# Patient Record
Sex: Male | Born: 1940
Health system: Southern US, Community
[De-identification: ages and names within clinical notes are randomized; demographics above are authoritative.]

## PROBLEM LIST (undated history)

## (undated) DIAGNOSIS — I2510 Atherosclerotic heart disease of native coronary artery without angina pectoris: Secondary | ICD-10-CM

## (undated) DIAGNOSIS — E039 Hypothyroidism, unspecified: Secondary | ICD-10-CM

## (undated) DIAGNOSIS — Z87442 Personal history of urinary calculi: Secondary | ICD-10-CM

## (undated) DIAGNOSIS — K5730 Diverticulosis of large intestine without perforation or abscess without bleeding: Secondary | ICD-10-CM

## (undated) DIAGNOSIS — E785 Hyperlipidemia, unspecified: Secondary | ICD-10-CM

## (undated) DIAGNOSIS — I6529 Occlusion and stenosis of unspecified carotid artery: Secondary | ICD-10-CM

## (undated) DIAGNOSIS — N200 Calculus of kidney: Secondary | ICD-10-CM

## (undated) DIAGNOSIS — K449 Diaphragmatic hernia without obstruction or gangrene: Secondary | ICD-10-CM

## (undated) DIAGNOSIS — N201 Calculus of ureter: Secondary | ICD-10-CM

## (undated) DIAGNOSIS — I739 Peripheral vascular disease, unspecified: Secondary | ICD-10-CM

## (undated) DIAGNOSIS — R001 Bradycardia, unspecified: Secondary | ICD-10-CM

## (undated) DIAGNOSIS — Z951 Presence of aortocoronary bypass graft: Secondary | ICD-10-CM

## (undated) DIAGNOSIS — I4510 Unspecified right bundle-branch block: Secondary | ICD-10-CM

## (undated) DIAGNOSIS — N401 Enlarged prostate with lower urinary tract symptoms: Secondary | ICD-10-CM

## (undated) DIAGNOSIS — R011 Cardiac murmur, unspecified: Secondary | ICD-10-CM

## (undated) DIAGNOSIS — R413 Other amnesia: Secondary | ICD-10-CM

## (undated) DIAGNOSIS — I10 Essential (primary) hypertension: Secondary | ICD-10-CM

## (undated) DIAGNOSIS — N1330 Unspecified hydronephrosis: Secondary | ICD-10-CM

## (undated) DIAGNOSIS — E119 Type 2 diabetes mellitus without complications: Secondary | ICD-10-CM

## (undated) DIAGNOSIS — R600 Localized edema: Secondary | ICD-10-CM

## (undated) HISTORY — DX: Essential (primary) hypertension: I10

## (undated) HISTORY — DX: Occlusion and stenosis of unspecified carotid artery: I65.29

## (undated) HISTORY — PX: TRANSTHORACIC ECHOCARDIOGRAM: SHX275

## (undated) HISTORY — DX: Other amnesia: R41.3

## (undated) HISTORY — DX: Peripheral vascular disease, unspecified: I73.9

## (undated) HISTORY — DX: Atherosclerotic heart disease of native coronary artery without angina pectoris: I25.10

## (undated) HISTORY — DX: Hypothyroidism, unspecified: E03.9

## (undated) HISTORY — DX: Hyperlipidemia, unspecified: E78.5

---

## 1996-10-26 HISTORY — PX: CORONARY ARTERY BYPASS GRAFT: SHX141

## 2009-10-26 HISTORY — PX: CATARACT EXTRACTION W/ INTRAOCULAR LENS  IMPLANT, BILATERAL: SHX1307

## 2013-06-14 ENCOUNTER — Other Ambulatory Visit: Payer: Self-pay | Admitting: Interventional Cardiology

## 2013-06-16 ENCOUNTER — Encounter (HOSPITAL_COMMUNITY)
Admission: RE | Disposition: A | Discharge status: Home / Self Care | Payer: Self-pay | Source: Ambulatory Visit | Attending: Interventional Cardiology

## 2013-06-16 ENCOUNTER — Ambulatory Visit (HOSPITAL_COMMUNITY)
Admission: RE | Admit: 2013-06-16 | Discharge: 2013-06-16 | Disposition: A | Discharge status: Home / Self Care | Payer: Medicare Other | Source: Ambulatory Visit | Attending: Interventional Cardiology | Admitting: Interventional Cardiology

## 2013-06-16 DIAGNOSIS — E663 Overweight: Secondary | ICD-10-CM | POA: Insufficient documentation

## 2013-06-16 DIAGNOSIS — E119 Type 2 diabetes mellitus without complications: Secondary | ICD-10-CM | POA: Insufficient documentation

## 2013-06-16 DIAGNOSIS — E039 Hypothyroidism, unspecified: Secondary | ICD-10-CM | POA: Insufficient documentation

## 2013-06-16 DIAGNOSIS — I2510 Atherosclerotic heart disease of native coronary artery without angina pectoris: Secondary | ICD-10-CM | POA: Insufficient documentation

## 2013-06-16 DIAGNOSIS — I2582 Chronic total occlusion of coronary artery: Secondary | ICD-10-CM | POA: Insufficient documentation

## 2013-06-16 DIAGNOSIS — E785 Hyperlipidemia, unspecified: Secondary | ICD-10-CM | POA: Insufficient documentation

## 2013-06-16 DIAGNOSIS — Z79899 Other long term (current) drug therapy: Secondary | ICD-10-CM | POA: Insufficient documentation

## 2013-06-16 DIAGNOSIS — R9439 Abnormal result of other cardiovascular function study: Secondary | ICD-10-CM | POA: Diagnosis present

## 2013-06-16 DIAGNOSIS — I25810 Atherosclerosis of coronary artery bypass graft(s) without angina pectoris: Secondary | ICD-10-CM | POA: Insufficient documentation

## 2013-06-16 DIAGNOSIS — I2089 Other forms of angina pectoris: Secondary | ICD-10-CM | POA: Diagnosis present

## 2013-06-16 DIAGNOSIS — I10 Essential (primary) hypertension: Secondary | ICD-10-CM | POA: Insufficient documentation

## 2013-06-16 DIAGNOSIS — Z6833 Body mass index (BMI) 33.0-33.9, adult: Secondary | ICD-10-CM | POA: Insufficient documentation

## 2013-06-16 DIAGNOSIS — I208 Other forms of angina pectoris: Secondary | ICD-10-CM | POA: Diagnosis present

## 2013-06-16 DIAGNOSIS — Z7982 Long term (current) use of aspirin: Secondary | ICD-10-CM | POA: Insufficient documentation

## 2013-06-16 HISTORY — PX: LEFT HEART CATHETERIZATION WITH CORONARY/GRAFT ANGIOGRAM: SHX5450

## 2013-06-16 LAB — GLUCOSE, CAPILLARY: Glucose-Capillary: 116 mg/dL — ABNORMAL HIGH (ref 70–99)

## 2013-06-16 SURGERY — LEFT HEART CATHETERIZATION WITH CORONARY/GRAFT ANGIOGRAM

## 2013-06-16 MED ORDER — ONDANSETRON HCL 4 MG/2ML IJ SOLN: 4.0000 mg | Freq: Four times a day (QID) | INTRAMUSCULAR | Status: DC | PRN

## 2013-06-16 MED ORDER — SODIUM CHLORIDE 0.9 % IV SOLN: INTRAVENOUS | Status: DC

## 2013-06-16 MED ORDER — DIAZEPAM 5 MG PO TABS
ORAL_TABLET | ORAL | Status: AC
  Administered 2013-06-16: 5 mg via ORAL
  Filled 2013-06-16: qty 1

## 2013-06-16 MED ORDER — SODIUM CHLORIDE 0.9 % IV SOLN: 250.0000 mL | INTRAVENOUS | Status: DC | PRN

## 2013-06-16 MED ORDER — FENTANYL CITRATE 0.05 MG/ML IJ SOLN
INTRAMUSCULAR | Status: AC
  Filled 2013-06-16: qty 2

## 2013-06-16 MED ORDER — LIDOCAINE HCL (PF) 1 % IJ SOLN
INTRAMUSCULAR | Status: AC
  Filled 2013-06-16: qty 30

## 2013-06-16 MED ORDER — ACETAMINOPHEN 325 MG PO TABS: 650.0000 mg | ORAL_TABLET | ORAL | Status: DC | PRN

## 2013-06-16 MED ORDER — SODIUM CHLORIDE 0.9 % IJ SOLN: 3.0000 mL | INTRAMUSCULAR | Status: DC | PRN

## 2013-06-16 MED ORDER — OXYCODONE-ACETAMINOPHEN 5-325 MG PO TABS: 1.0000 | ORAL_TABLET | ORAL | Status: DC | PRN

## 2013-06-16 MED ORDER — HEPARIN (PORCINE) IN NACL 2-0.9 UNIT/ML-% IJ SOLN
INTRAMUSCULAR | Status: AC
  Filled 2013-06-16: qty 1000

## 2013-06-16 MED ORDER — SODIUM CHLORIDE 0.9 % IV SOLN
INTRAVENOUS | Status: DC
  Administered 2013-06-16: 08:00:00 via INTRAVENOUS

## 2013-06-16 MED ORDER — ASPIRIN 81 MG PO CHEW
CHEWABLE_TABLET | ORAL | Status: AC
  Administered 2013-06-16: 324 mg via ORAL
  Filled 2013-06-16: qty 4

## 2013-06-16 MED ORDER — DIAZEPAM 5 MG PO TABS: 5.0000 mg | ORAL_TABLET | ORAL | Status: AC

## 2013-06-16 MED ORDER — MIDAZOLAM HCL 2 MG/2ML IJ SOLN
INTRAMUSCULAR | Status: AC
  Filled 2013-06-16: qty 2

## 2013-06-16 MED ORDER — NITROGLYCERIN 0.2 MG/ML ON CALL CATH LAB
INTRAVENOUS | Status: AC
  Filled 2013-06-16: qty 1

## 2013-06-16 MED ORDER — SODIUM CHLORIDE 0.9 % IJ SOLN: 3.0000 mL | Freq: Two times a day (BID) | INTRAMUSCULAR | Status: DC

## 2013-06-16 MED ORDER — ASPIRIN 81 MG PO CHEW: 324.0000 mg | CHEWABLE_TABLET | ORAL | Status: AC

## 2013-06-16 NOTE — H&P (Signed)
  [name] [name] is 72 years of age and has a history of remote coronary artery bypass grafting. Within the past 6 weeks the patient has experienced exertional chest tightness that requires him stopping activity. The discomfort can last up to 30 minutes. A stress nuclear study was done and was intermediate risk. He is on antianginal therapy including amlodipine. He presents now to undergo diagnostic catheterization to define anatomy and rule out bypass graft failure. The procedure and its risks including death, stroke, myocardial infarction, kidney failure, bleeding, limb ischemia, among others were discussed in detail with the patient in the presence of his family this morning. He is willing to proceed with the procedure.Cath Lab Visit (complete for each Cath Lab visit)  Clinical Evaluation Leading to the Procedure:   ACS: no  Non-ACS:    Anginal Classification: CCS III  Anti-ischemic medical therapy: Minimal Therapy (1 class of medications)  Non-Invasive Test Results: Intermediate-risk stress test findings: cardiac mortality 1-3%/year  Prior CABG: Previous CABG

## 2013-06-16 NOTE — CV Procedure (Signed)
     Diagnostic Cardiac Catheterization Report  [name] [name]  72 y.o.  male [birth year]  Procedure Date: 06/16/2013  Referring Physician:  HWBSmith, MD Primary Cardiologist:: HWBSmith,MD   PROCEDURE:  Left heart catheterization with selective coronary angiography, left ventriculogram.  INDICATIONS:  Recent onset angina, class III, with intermediate risk abnormal nuclear study in a patient who is 17 years post bypass grafting  The risks, benefits, and details of the procedure were explained to the patient.  The patient verbalized understanding and wanted to proceed.  Informed written consent was obtained.  PROCEDURE TECHNIQUE:  After Xylocaine anesthesia a 5 French sheath was placed in the right femoral artery with a single anterior needle wall stick.   Coronary angiography was done using a 5 [place] A2 MP catheter.  Left ventriculography was done using a 5 [place] A2 MP catheter.    CONTRAST:  Total of 120 cc.  COMPLICATIONS:  None.    HEMODYNAMICS:  Aortic pressure was 124/66 mmHg; LV pressure was 136/7 mmHg; LVEDP 14 mm mercury.  There was no gradient between the left ventricle and aorta.    ANGIOGRAPHIC DATA:   The left main coronary artery is subtotally occluded distally with TIMI grade 2 flow into an obtuse marginal/circumflex vessel is poorly opacified..  The left anterior descending artery is totally occluded.  The left circumflex artery is totally occluded.  The right coronary artery is totally occluded.  LEFT VENTRICULOGRAM:  Left ventricular angiogram was done in the 30 RAO projection and revealed normal left ventricular wall motion and systolic function with an estimated ejection fraction of 50 %.  LVEDP was 14 mmHg.  BYPASS ANGIOGRAPHY:  The saphenous vein graft to diagonal is widely patent  The saphenous vein graft sequential to first and second marginal is patent to the first marginal and the limb to the second marginal is occluded.  The saphenous vein graft to  the acute marginal and PDA is widely patent and without diffuse luminal irregularities noted.  The left internal mammary graft to the distal LAD is patent.   IMPRESSIONS:  1. Widely patent saphenous vein grafts to the PDA/RV branch, the first diagonal, and the first obtuse marginal. The continuation limb to the second obtuse marginal is totally occluded.  2. Widely patent LIMA to the distal LAD  3. Severe native vessel occlusive disease with ostial total occlusion of the right coronary, distal 99% occlusion of the left main, and evidence of heavy-use coronary calcification in the left main LAD circumflex and right coronary. I suspect that the progression in symptoms are related to occlusion of the left main then now leave the proximal LAD territory and the distal circumflex territory dependent on retrograde flow via the saphenous vein graft to the diagonal.  4. The distal circumflex is collateralized from the obtuse marginal graft.  5. Normal LV function   RECOMMENDATION:  Medical therapy.

## 2013-11-17 ENCOUNTER — Encounter: Payer: Self-pay | Admitting: Cardiology

## 2013-11-17 DIAGNOSIS — I739 Peripheral vascular disease, unspecified: Secondary | ICD-10-CM

## 2013-11-17 DIAGNOSIS — E119 Type 2 diabetes mellitus without complications: Secondary | ICD-10-CM

## 2013-11-17 DIAGNOSIS — R9431 Abnormal electrocardiogram [ECG] [EKG]: Secondary | ICD-10-CM

## 2013-11-17 DIAGNOSIS — I2510 Atherosclerotic heart disease of native coronary artery without angina pectoris: Secondary | ICD-10-CM

## 2013-11-17 DIAGNOSIS — E7800 Pure hypercholesterolemia, unspecified: Secondary | ICD-10-CM

## 2013-11-17 DIAGNOSIS — E039 Hypothyroidism, unspecified: Secondary | ICD-10-CM

## 2013-11-17 DIAGNOSIS — I10 Essential (primary) hypertension: Secondary | ICD-10-CM

## 2013-11-17 DIAGNOSIS — E0590 Thyrotoxicosis, unspecified without thyrotoxic crisis or storm: Secondary | ICD-10-CM

## 2013-11-17 DIAGNOSIS — E669 Obesity, unspecified: Secondary | ICD-10-CM

## 2013-11-17 DIAGNOSIS — R0989 Other specified symptoms and signs involving the circulatory and respiratory systems: Secondary | ICD-10-CM

## 2013-11-17 DIAGNOSIS — I4510 Unspecified right bundle-branch block: Secondary | ICD-10-CM

## 2013-11-17 DIAGNOSIS — I25810 Atherosclerosis of coronary artery bypass graft(s) without angina pectoris: Secondary | ICD-10-CM | POA: Insufficient documentation

## 2013-11-17 DIAGNOSIS — M25519 Pain in unspecified shoulder: Secondary | ICD-10-CM

## 2013-11-17 DIAGNOSIS — E785 Hyperlipidemia, unspecified: Secondary | ICD-10-CM

## 2013-11-21 ENCOUNTER — Ambulatory Visit (INDEPENDENT_AMBULATORY_CARE_PROVIDER_SITE_OTHER): Payer: Medicare Other | Admitting: Interventional Cardiology

## 2013-11-21 ENCOUNTER — Encounter: Payer: Self-pay | Admitting: Interventional Cardiology

## 2013-11-21 VITALS — BP 142/78 | HR 53 | Ht 66.0 in | Wt 207.0 lb

## 2013-11-21 DIAGNOSIS — E785 Hyperlipidemia, unspecified: Secondary | ICD-10-CM

## 2013-11-21 DIAGNOSIS — I10 Essential (primary) hypertension: Secondary | ICD-10-CM

## 2013-11-21 DIAGNOSIS — I2510 Atherosclerotic heart disease of native coronary artery without angina pectoris: Secondary | ICD-10-CM

## 2013-11-21 DIAGNOSIS — I2089 Other forms of angina pectoris: Secondary | ICD-10-CM

## 2013-11-21 DIAGNOSIS — I208 Other forms of angina pectoris: Secondary | ICD-10-CM

## 2013-11-21 DIAGNOSIS — I209 Angina pectoris, unspecified: Secondary | ICD-10-CM

## 2013-11-21 NOTE — Patient Instructions (Signed)
Your physician recommends that you continue on your current medications as directed. Please refer to the Current Medication list given to you today.  Take Nitro 5-10 minutes before going out to cut wood. Call the office with an update in 30 days to let us know if chest discomfort has improved with the use of Nitro.  Your physician wants you to follow-up in: 6 months You will receive a reminder letter in the mail two months in advance. If you don't receive a letter, please call our office to schedule the follow-up appointment.

## 2013-11-21 NOTE — Progress Notes (Signed)
Patient ID: [name] [name], male   DOB: 08/20/1941, 73 y.o.   MRN: [id number] Past Medical History  CAD with CABG x 6, 1998, with LIMA to LAD,and other undefined SVG' S.   Hypertension   Hyperlipidemia   Diabetes mellitus   Hypothyroidism   Overweight   Cataracts   Kidney stones      [address] [place], [place]  [address] Phone: [phone] Fax:  [phone]  Date:  11/21/2013   ID:  [name] [name], DOB 03/22/1941, MRN [id number]  PCP:  [name] ApoPOLITE,[name], MD   ASSESSMENT:  1. Angina pectoris, class II-III, aggravated by cold weather. 2. Underlying coronary artery disease with previous bypass grafting and documented bypass graft failure of one SVG 3. Hypertension, controlled 4. Hyperlipidemia on adequate therapy  PLAN:  1. Use nitroglycerin prophylactically prior to with cutting. He is instructed to take a sublingual nitroglycerin 5-10 minutes prior to with cutting. He should be seated for that length of time before going outside to do the work. He should call us back to let us know if had a nitroglycerin on board allows him to do more before angina occurs. We may consider starting isosorbide mononitrate.  2. Activity as tolerated, avoiding extremely cold weather as this aggravates angina 3. Clinical followup in 6 months   SUBJECTIVE: [name] [name] is a 73 y.o. male who is doing relatively well. He is not having angina at rest. He finds a cold weather limits his activity due to the development of angina. The same activities in more moderate climate can be performed without chest discomfort. He denies orthopnea, PND, palpitations, and syncope. The nature with activity that he picked this up a stent which precipitates angina is woodcutting. When the temperature is greater than 50 he can do this without chest discomfort. Less than 45 especially his hands get cold, he has angina.   Wt Readings from Last 3 Encounters:  11/21/13 207 lb (93.895 kg)  06/16/13 208 lb  (94.348 kg)  06/16/13 208 lb (94.348 kg)     Past Medical History  Diagnosis Date  . Hypertension   . Hyperlipidemia   . Diabetes mellitus without complication   . Hypothyroidism   . Obesity   . Cataract   . Kidney stones     Current Outpatient Prescriptions  Medication Sig Dispense Refill  . amLODipine (NORVASC) 5 MG tablet Take 5 mg by mouth daily.      Marland Kitchen. aspirin EC 325 MG tablet Take 325 mg by mouth daily.      Marland Kitchen. atorvastatin (LIPITOR) 80 MG tablet Take 40 mg by mouth daily.       . finasteride (PROSCAR) 5 MG tablet Take 5 mg by mouth daily.      . Flaxseed, Linseed, (FLAX SEEDS PO) Take 1 capsule by mouth daily.      . hydrochlorothiazide (HYDRODIURIL) 25 MG tablet Take 25 mg by mouth daily.      Marland Kitchen. levothyroxine (SYNTHROID, LEVOTHROID) 88 MCG tablet Take 88 mcg by mouth daily before breakfast.      . lisinopril (PRINIVIL,ZESTRIL) 40 MG tablet Take 40 mg by mouth daily.      . metFORMIN (GLUCOPHAGE) 500 MG tablet Take 500 mg by mouth 2 (two) times daily with a meal.      . metoprolol succinate (TOPROL-XL) 50 MG 24 hr tablet Take 50 mg by mouth daily. Take with or immediately following a meal.      . nitroGLYCERIN (NITROSTAT) 0.4 MG SL tablet  Place 0.4 mg under the tongue every 5 (five) minutes as needed for chest pain.      . potassium chloride SA (K-DUR,KLOR-CON) 20 MEQ tablet Take 20 mEq by mouth daily.       No current facility-administered medications for this visit.    Allergies:    Allergies  Allergen Reactions  . Penicillins Rash    Social History:  The patient  reports that he has never smoked. He does not have any smokeless tobacco history on file. He reports that he does not drink alcohol or use illicit drugs.   ROS:  Please see the history of present illness.   No palpitations. Denies orthopnea and PND. Stable lower extremity edema.   All other systems reviewed and negative.   OBJECTIVE: VS:  BP 142/78  Pulse 53  Ht 5\' 6"  (1.676 m)  Wt 207 lb (93.895 kg)   BMI 33.43 kg/m2 Well nourished, well developed, in no acute distress, obese  HEENT: normal Neck: JVD flat. Carotid bruit absent  Cardiac:  normal S1, S2; RRR; no murmur Lungs:  clear to auscultation bilaterally, no wheezing, rhonchi or rales Abd: soft, nontender, no hepatomegaly Ext: Edema 1+ bilateral. Pulses trace to 1+ bilateral Skin: warm and dry Neuro:  CNs 2-12 intact, no focal abnormalities noted  EKG:  Sinus bradycardia, right bundle branch block,   and no change compared to prior tracings.   Signed, Darci Needle III, MD 11/21/2013 9:49 AM

## 2013-12-07 ENCOUNTER — Other Ambulatory Visit: Payer: Self-pay | Admitting: Interventional Cardiology

## 2014-03-06 ENCOUNTER — Other Ambulatory Visit: Payer: Self-pay | Admitting: Interventional Cardiology

## 2014-05-21 ENCOUNTER — Encounter: Payer: Self-pay | Admitting: Interventional Cardiology

## 2014-05-21 ENCOUNTER — Ambulatory Visit (INDEPENDENT_AMBULATORY_CARE_PROVIDER_SITE_OTHER): Payer: Medicare Other | Admitting: Interventional Cardiology

## 2014-05-21 VITALS — BP 163/73 | HR 55 | Ht 66.0 in | Wt 206.4 lb

## 2014-05-21 DIAGNOSIS — I208 Other forms of angina pectoris: Secondary | ICD-10-CM

## 2014-05-21 DIAGNOSIS — I10 Essential (primary) hypertension: Secondary | ICD-10-CM

## 2014-05-21 DIAGNOSIS — I209 Angina pectoris, unspecified: Secondary | ICD-10-CM

## 2014-05-21 DIAGNOSIS — I2510 Atherosclerotic heart disease of native coronary artery without angina pectoris: Secondary | ICD-10-CM

## 2014-05-21 MED ORDER — NITROGLYCERIN 0.4 MG SL SUBL: 0.4000 mg | SUBLINGUAL_TABLET | SUBLINGUAL | Status: DC | PRN

## 2014-05-21 NOTE — Progress Notes (Signed)
Patient ID: [name] [name], male   DOB: [birth year], 73 y.o.   MRN: [id number]    [address] [place], [place]  [address] Phone: [phone] Fax:  [phone]  Date:  05/21/2014   ID:  [name] [name], DOB 03-23-1941, MRN [id number]  PCP:  Katy Apo, MD   ASSESSMENT:  1. Coronary artery disease with stable angina pectoris 2. Hypertension, with moderate elevation in systolic pressure. Repeat blood pressure 140/88 3. Obesity 4. Hyperlipidemia  PLAN:  1. Salt reduction  2. Physical activity as tolerated 3. Low-fat diet   SUBJECTIVE: [name] [name] is a 73 y.o. male who has minimal angina. No change in pattern over the last 6 months. His weight is slightly decreased compared to the last visit. He still has a ways to go. 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 He denies orthopnea and PND. He takes her medications regularly.   Wt Readings from Last 3 Encounters:  05/21/14 206 lb 6.4 oz (93.622 kg)  11/21/13 207 lb (93.895 kg)  06/16/13 208 lb (94.348 kg)     Past Medical History  Diagnosis Date  . Hypertension   . Hyperlipidemia   . Diabetes mellitus without complication   . Hypothyroidism   . Obesity   . Cataract   . Kidney stones     Current Outpatient Prescriptions  Medication Sig Dispense Refill  . amLODipine (NORVASC) 5 MG tablet TAKE 1 TABLET BY MOUTH EVERY DAY  90 tablet  0  . aspirin EC 325 MG tablet Take 325 mg by mouth daily.      Marland Kitchen atorvastatin (LIPITOR) 80 MG tablet Take 40 mg by mouth daily.       . finasteride (PROSCAR) 5 MG tablet Take 5 mg by mouth daily.      . Flaxseed, Linseed, (FLAX SEEDS PO) Take 1 capsule by mouth daily.      . hydrochlorothiazide (HYDRODIURIL) 25 MG tablet Take 25 mg by mouth daily.      Marland Kitchen levothyroxine (SYNTHROID, LEVOTHROID) 88 MCG tablet Take 88 mcg by mouth daily before breakfast.      . lisinopril (PRINIVIL,ZESTRIL) 40 MG tablet Take 40 mg by mouth daily.      . metFORMIN (GLUCOPHAGE) 500 MG tablet Take 500 mg  by mouth 2 (two) times daily with a meal.      . metoprolol succinate (TOPROL-XL) 50 MG 24 hr tablet Take 50 mg by mouth daily. Take with or immediately following a meal.      . nitroGLYCERIN (NITROSTAT) 0.4 MG SL tablet Place 0.4 mg under the tongue every 5 (five) minutes as needed for chest pain.      . potassium chloride SA (K-DUR,KLOR-CON) 20 MEQ tablet Take 20 mEq by mouth daily.       No current facility-administered medications for this visit.    Allergies:    Allergies  Allergen Reactions  . Penicillins Rash    Social History:  The patient  reports that he has never smoked. He does not have any smokeless tobacco history on file. He reports that he does not drink alcohol or use illicit drugs.   ROS:  Please see the history of present illness.   Denies claudication, orthopnea, PND, palpitations, syncope, and edema.   All other systems reviewed and negative.   OBJECTIVE: VS:  BP 163/73  Pulse 55  Ht 5\' 6"  (1.676 m)  Wt 206 lb 6.4 oz (93.622 kg)  BMI 33.33 kg/m2 Well nourished, well developed, in no  acute distress, obese HEENT: normal Neck: JVD flat. Carotid bruit absent  Cardiac:  normal S1, S2; RRR; no murmur Lungs:  clear to auscultation bilaterally, no wheezing, rhonchi or rales Abd: soft, nontender, no hepatomegaly Ext: Edema absent. Pulses 2+ bilateral Skin: warm and dry Neuro:  CNs 2-12 intact, no focal abnormalities noted  EKG:  Not performed today       Signed, Darci NeedleHenry W. B. Jauna Raczynski III, MD 05/21/2014 9:49 AM

## 2014-05-21 NOTE — Patient Instructions (Signed)
Your physician recommends that you continue on your current medications as directed. Please refer to the Current Medication list given to you today.  A refill for Nitro-Glycerin has been sent to your pharmacy  Your physician discussed the importance of regular exercise and recommended that you start or continue a regular exercise program for good health.  Your physician wants you to follow-up in: 1 year with Dr.Smith You will receive a reminder letter in the mail two months in advance. If you don't receive a letter, please call our office to schedule the follow-up appointment.

## 2014-10-04 ENCOUNTER — Encounter (HOSPITAL_COMMUNITY): Payer: Self-pay | Admitting: Interventional Cardiology

## 2014-10-12 ENCOUNTER — Other Ambulatory Visit (HOSPITAL_COMMUNITY): Payer: Self-pay | Admitting: Internal Medicine

## 2014-10-12 ENCOUNTER — Ambulatory Visit (HOSPITAL_COMMUNITY)
Admission: RE | Admit: 2014-10-12 | Discharge: 2014-10-12 | Disposition: A | Discharge status: Home / Self Care | Payer: Medicare Other | Source: Ambulatory Visit | Attending: Vascular Surgery | Admitting: Vascular Surgery

## 2014-10-12 DIAGNOSIS — R0989 Other specified symptoms and signs involving the circulatory and respiratory systems: Secondary | ICD-10-CM

## 2014-11-07 ENCOUNTER — Encounter: Payer: Self-pay | Admitting: Vascular Surgery

## 2014-11-07 ENCOUNTER — Other Ambulatory Visit: Payer: Self-pay | Admitting: *Deleted

## 2014-11-07 DIAGNOSIS — I7092 Chronic total occlusion of artery of the extremities: Secondary | ICD-10-CM

## 2014-12-06 ENCOUNTER — Encounter: Payer: Self-pay | Admitting: Vascular Surgery

## 2014-12-07 ENCOUNTER — Other Ambulatory Visit: Payer: Self-pay | Admitting: Vascular Surgery

## 2014-12-07 ENCOUNTER — Encounter: Payer: Self-pay | Admitting: Vascular Surgery

## 2014-12-07 ENCOUNTER — Ambulatory Visit (HOSPITAL_COMMUNITY)
Admission: RE | Admit: 2014-12-07 | Discharge: 2014-12-07 | Disposition: A | Discharge status: Home / Self Care | Payer: PPO | Source: Ambulatory Visit | Attending: Vascular Surgery | Admitting: Vascular Surgery

## 2014-12-07 ENCOUNTER — Ambulatory Visit (INDEPENDENT_AMBULATORY_CARE_PROVIDER_SITE_OTHER): Payer: PPO | Admitting: Vascular Surgery

## 2014-12-07 VITALS — BP 147/78 | HR 59 | Ht 66.0 in | Wt 197.5 lb

## 2014-12-07 DIAGNOSIS — I739 Peripheral vascular disease, unspecified: Secondary | ICD-10-CM

## 2014-12-07 DIAGNOSIS — E1151 Type 2 diabetes mellitus with diabetic peripheral angiopathy without gangrene: Secondary | ICD-10-CM

## 2014-12-07 DIAGNOSIS — I7092 Chronic total occlusion of artery of the extremities: Secondary | ICD-10-CM

## 2014-12-07 DIAGNOSIS — E1159 Type 2 diabetes mellitus with other circulatory complications: Secondary | ICD-10-CM

## 2014-12-07 DIAGNOSIS — Z48812 Encounter for surgical aftercare following surgery on the circulatory system: Secondary | ICD-10-CM

## 2014-12-07 DIAGNOSIS — I872 Venous insufficiency (chronic) (peripheral): Secondary | ICD-10-CM

## 2014-12-07 NOTE — Progress Notes (Signed)
Referred by:  Katy Apo, MD [address] [place], [place] [address]  Reason for referral: possible BLE PAD  History of Present Illness  [name] [name] is a 74 y.o. (Apr 04, 1941) male who presents with chief complaint: swollen R leg.  Pt is a known diabetic that was found on routine examination by his PCP to have decreased pulses in his L leg.  The patient denies any intermittent claudication or rest pain though he notes his ambulation is limited.  His primary complaint is right leg swelling with extended standing or ambulation.  The patient had bilateral GSV harvested for his CABG.  The patient's symptoms include: rash in R leg and swelling.  The patient has had no history of DVT, known history of varicose vein, no history of venous stasis ulcers, no history of  Lymphedema and known history of skin changes in lower legs.  There is no family history of venous disorders.  The patient has never used compression stockings in the past.  Past Medical History  Diagnosis Date  . Hypertension   . Hyperlipidemia   . Diabetes mellitus without complication   . Hypothyroidism   . Obesity   . Cataract   . Kidney stones   . CAD (coronary artery disease)     Past Surgical History  Procedure Laterality Date  . Coronary artery bypass graft      CABG X 6, 1998, WITH LIMA  to the LAD, other undefined SVG's  . Cardiac catheterization  06/16/13    Severe native vessel occlusive disease with ostial total occlusion of the right coronary, distal 99% occlusion of the left main, and evidence of heavy-use coronary calcification in the left main LAD circumflex and RCA  . Left heart catheterization with coronary/graft angiogram  06/16/2013    Procedure: LEFT HEART CATHETERIZATION WITH Isabel Caprice;  Surgeon: Lesleigh Noe, MD;  Location: [facility] CATH LAB;  Service: Cardiovascular;;    History   Social History  . Marital Status: Married    Spouse Name: N/A  . Number of Children:  N/A  . Years of Education: N/A   Occupational History  . Not on file.   Social History Main Topics  . Smoking status: Former Smoker    Types: Cigarettes    Quit date: 10/26/1966  . Smokeless tobacco: Not on file  . Alcohol Use: No  . Drug Use: No  . Sexual Activity: Not on file   Other Topics Concern  . Not on file   Social History Narrative    Family History  Problem Relation Age of Onset  . Tuberculosis Mother   . CAD Father      Current Outpatient Prescriptions on File Prior to Visit  Medication Sig Dispense Refill  . amLODipine (NORVASC) 5 MG tablet TAKE 1 TABLET BY MOUTH EVERY DAY 90 tablet 0  . aspirin EC 325 MG tablet Take 325 mg by mouth daily.    Marland Kitchen atorvastatin (LIPITOR) 80 MG tablet Take 40 mg by mouth daily.     . finasteride (PROSCAR) 5 MG tablet Take 5 mg by mouth daily.    . Flaxseed, Linseed, (FLAX SEEDS PO) Take 1 capsule by mouth daily.    . hydrochlorothiazide (HYDRODIURIL) 25 MG tablet Take 25 mg by mouth daily.    Marland Kitchen levothyroxine (SYNTHROID, LEVOTHROID) 88 MCG tablet Take 88 mcg by mouth daily before breakfast.    . lisinopril (PRINIVIL,ZESTRIL) 40 MG tablet Take 40 mg by mouth daily.    Marland Kitchen  metFORMIN (GLUCOPHAGE) 500 MG tablet Take 500 mg by mouth 2 (two) times daily with a meal.    . metoprolol succinate (TOPROL-XL) 50 MG 24 hr tablet Take 50 mg by mouth daily. Take with or immediately following a meal.    . nitroGLYCERIN (NITROSTAT) 0.4 MG SL tablet Place 1 tablet (0.4 mg total) under the tongue every 5 (five) minutes as needed for chest pain. 25 tablet 3  . potassium chloride SA (K-DUR,KLOR-CON) 20 MEQ tablet Take 20 mEq by mouth daily.     No current facility-administered medications on file prior to visit.    Allergies  Allergen Reactions  . Penicillins Rash    REVIEW OF SYSTEMS:  (Positives checked otherwise negative)  CARDIOVASCULAR:  []  chest pain, []  chest pressure, []  palpitations, []  shortness of breath when laying flat, []  shortness  of breath with exertion,  []  pain in feet when walking, []  pain in feet when laying flat, []  history of blood clot in veins (DVT), []  history of phlebitis, [x]  swelling in legs, []  varicose veins  PULMONARY:  []  productive cough, []  asthma, []  wheezing  NEUROLOGIC:  []  weakness in arms or legs, []  numbness in arms or legs, []  difficulty speaking or slurred speech, []  temporary loss of vision in one eye, []  dizziness  HEMATOLOGIC:  []  bleeding problems, []  problems with blood clotting too easily  MUSCULOSKEL:  []  joint pain, []  joint swelling  GASTROINTEST:  []  vomiting blood, []  blood in stool     GENITOURINARY:  []  burning with urination, []  blood in urine  PSYCHIATRIC:  []  history of major depression  INTEGUMENTARY:  []  rashes, []  ulcers  CONSTITUTIONAL:  []  fever, []  chills   Physical Examination Filed Vitals:   12/07/14 1008  BP: 147/78  Pulse: 59  Height: 5\' 6"  (1.676 m)  Weight: 197 lb 8 oz (89.585 kg)  SpO2: 99%   Body mass index is 31.89 kg/(m^2).  General: A&O x 3, WDWN  Head: [place]/AT  Ear/Nose/Throat: Hearing grossly intact, nares w/o erythema or drainage, oropharynx w/o Erythema/Exudate  Eyes: PERRLA, EOMI  Neck: Supple, no nuchal rigidity, no palpable LAD  Pulmonary: Sym exp, good air movt, CTAB, no rales, rhonchi, & wheezing  Cardiac: RRR, Nl S1, S2, no Murmurs, rubs or gallops  Vascular: Vessel Right Left  Radial Palpable Palpable  Brachial Palpable Palpable  Carotid Palpable, without bruit Palpable, without bruit  Aorta Not palpable N/A  Femoral Palpable Palpable  Popliteal Not palpable Not palpable  PT Palpable Faintly Palpable  DP Palpable Palpable   Gastrointestinal: soft, NTND, -G/R, - HSM, - masses, - CVAT B, unable to palpate aorta due to pannus  Musculoskeletal: M/S 5/5 throughout , Extremities without ischemic changes , B healed GSV harvest incisions, R LDS, L spider and varicose veins  Neurologic: CN 2-12 intact , Pain and light touch  intact in extremities , Motor exam as listed above  Psychiatric: Judgment intact, Mood & affect appropriate for pt's clinical situation  Dermatologic: See M/S exam for extremity exam, no rashes otherwise noted  Lymph : No Cervical, Axillary, or Inguinal lymphadenopathy   Non-Invasive Vascular Imaging  ABI (Date: 10/12/14)  R: [place], DP: tri, PT: tri, TBI: 0.74  L: [place], DP: tr, PT: tri, TBI: 0.78    BLE arterial duplex (Date: 12/07/14)  Biphasic and triphasic flow throughout both legs including tibial arteries  Calcification at multiple levels visualized  Outside Studies/Documentation 8 pages of outside documents were reviewed including: outpatient CP chart and non-invasive studies.  Medical Decision Making  [name] [name] is a 74 y.o. male who presents with: BLE chronic venous insufficiency (C2), non-hemodynamically significant calcific peripheral arterial disease c/w diabetic complicaitons   The duplex and ABI demonstrate calcific atherosclerosis, but fortunately, he is maintaining lumen in his tibials.  Management of such is primarily medical in nature. The patient is currently on a statin: Lipitor. The patient is currently on an anti-platelet: ASA. In regards to the right leg "rash" I suspect this is lipodermatosclerosis due to chronic venous insufficiency which I would confirm with studies.  Based on the patient's history and examination, I recommend: BLE venous insufficiency duplex.  As his GSV are harvested bilaterally, usually mgmt of deep venous reflux is compressive therapy.  He is going to follow up with this study in 2-4 weeks.  Thank you for allowing [place] to participate in this patient's care.  Leonides Sake, MD [facility] Office: [phone] Pager: [phone]  12/07/2014, 10:39 AM

## 2014-12-10 NOTE — Addendum Note (Signed)
Addended by: Adria DillELDRIDGE-LEWIS, Jarom Govan L on: 12/10/2014 02:12 PM   Modules accepted: Orders

## 2014-12-20 ENCOUNTER — Encounter: Payer: Self-pay | Admitting: Vascular Surgery

## 2014-12-21 ENCOUNTER — Other Ambulatory Visit: Payer: Self-pay | Admitting: Vascular Surgery

## 2014-12-21 ENCOUNTER — Ambulatory Visit (HOSPITAL_COMMUNITY)
Admission: RE | Admit: 2014-12-21 | Discharge: 2014-12-21 | Disposition: A | Discharge status: Home / Self Care | Payer: PPO | Source: Ambulatory Visit | Attending: Vascular Surgery | Admitting: Vascular Surgery

## 2014-12-21 ENCOUNTER — Ambulatory Visit (INDEPENDENT_AMBULATORY_CARE_PROVIDER_SITE_OTHER): Payer: PPO | Admitting: Vascular Surgery

## 2014-12-21 ENCOUNTER — Encounter: Payer: Self-pay | Admitting: Vascular Surgery

## 2014-12-21 VITALS — BP 107/57 | HR 48 | Ht 66.0 in | Wt 198.9 lb

## 2014-12-21 DIAGNOSIS — Z951 Presence of aortocoronary bypass graft: Secondary | ICD-10-CM | POA: Insufficient documentation

## 2014-12-21 DIAGNOSIS — I739 Peripheral vascular disease, unspecified: Secondary | ICD-10-CM | POA: Insufficient documentation

## 2014-12-21 DIAGNOSIS — Z48812 Encounter for surgical aftercare following surgery on the circulatory system: Secondary | ICD-10-CM

## 2014-12-21 DIAGNOSIS — E1159 Type 2 diabetes mellitus with other circulatory complications: Secondary | ICD-10-CM | POA: Diagnosis not present

## 2014-12-21 DIAGNOSIS — I872 Venous insufficiency (chronic) (peripheral): Secondary | ICD-10-CM | POA: Insufficient documentation

## 2014-12-21 DIAGNOSIS — E1151 Type 2 diabetes mellitus with diabetic peripheral angiopathy without gangrene: Secondary | ICD-10-CM

## 2014-12-21 NOTE — Progress Notes (Signed)
    Established Venous Insufficiency  History of Present Illness  [name] [name] is a 74 y.o. (11/05/1940) male who presents with chief complaint: follow on venous studies.  The patient's symptoms have not progressed.  The patient's symptoms are: R>L leg swelling.  The patient's treatment regimen currently included: maximal medical management.  The patient's PMH, PSH, SH, FamHx, Med, and Allergies are unchanged from 12/07/14.  On ROS today: no intermittent claudication , no rest pain, no change in leg swelling  Physical Examination  Filed Vitals:   12/21/14 1411  BP: 107/57  Pulse: 48  Height: 5\' 6"  (1.676 m)  Weight: 198 lb 14.4 oz (90.22 kg)  SpO2: 98%   Body mass index is 32.12 kg/(m^2).  General: A&O x 3, WDWN  Pulmonary: Sym exp, good air movt, CTAB, no rales, rhonchi, & wheezing  Cardiac: RRR, Nl S1, S2, no Murmurs, rubs or gallops  Vascular: Vessel Right Left  Radial Palpable Palpable  Brachial Palpable Palpable  Carotid Palpable, without bruit Palpable, without bruit  Aorta Not palpable N/A  Femoral Palpable Palpable  Popliteal Not palpable Not palpable  PT Palpable Faintly Palpable  DP Palpable Palpable   Gastrointestinal: soft, NTND, -G/R, - HSM, - masses, - CVAT B, unable to palpate aorta due to pannus  Musculoskeletal: M/S 5/5 throughout , Extremities without ischemic changes , B healed GSV harvest incisions, R LDS, L spider and varicose veins  Neurologic: Pain and light touch intact in extremities , Motor exam as listed above  Non-Invasive Vascular Imaging  BLE Venous Insufficiency Duplex (Date: 12/21/2014):   RLE: No DVT and SVT, No GSV: harvested, +deep venous reflux: PTV, incompetent calf perforator  LLE: No DVT and SVT, No GSV: harvested, +deep venous reflux: SFJ, incompetent calf perforator  Medical Decision Making  [name] [name] is a 74 y.o. male who presents with: B leg chronic venous insufficiency (C4),  non-hemodynamically significant calcific peripheral arterial disease c/w diabetic complications  Based on the patient's vascular studies and examination, I have offered the patient: compressive therapy.  I suspect he might have problems getting a full therapeutic pair of compression stocking on, so I recommended he try out a pair of OTC compression stockings.  I also would continue with annual BLE ABI.  Thank you for allowing us to participate in this patient's care.  Leonides SakeBrian Chen, MD [facility] [place] Office: [phone] Pager: [phone]  12/21/2014, 3:09 PM

## 2014-12-21 NOTE — Addendum Note (Signed)
Addended by: Sharee PimpleMCCHESNEY, MARILYN K on: 12/21/2014 05:32 PM   Modules accepted: Orders

## 2015-05-10 ENCOUNTER — Emergency Department (HOSPITAL_COMMUNITY)
Admission: EM | Admit: 2015-05-10 | Discharge: 2015-05-10 | Disposition: A | Discharge status: Home / Self Care | Payer: PPO | Attending: Emergency Medicine | Admitting: Emergency Medicine

## 2015-05-10 ENCOUNTER — Emergency Department (HOSPITAL_COMMUNITY): Payer: PPO

## 2015-05-10 ENCOUNTER — Encounter (HOSPITAL_COMMUNITY): Payer: Self-pay

## 2015-05-10 DIAGNOSIS — N200 Calculus of kidney: Secondary | ICD-10-CM | POA: Insufficient documentation

## 2015-05-10 DIAGNOSIS — Z7982 Long term (current) use of aspirin: Secondary | ICD-10-CM | POA: Diagnosis not present

## 2015-05-10 DIAGNOSIS — E119 Type 2 diabetes mellitus without complications: Secondary | ICD-10-CM | POA: Insufficient documentation

## 2015-05-10 DIAGNOSIS — I10 Essential (primary) hypertension: Secondary | ICD-10-CM | POA: Insufficient documentation

## 2015-05-10 DIAGNOSIS — Z9889 Other specified postprocedural states: Secondary | ICD-10-CM | POA: Diagnosis not present

## 2015-05-10 DIAGNOSIS — Z951 Presence of aortocoronary bypass graft: Secondary | ICD-10-CM | POA: Diagnosis not present

## 2015-05-10 DIAGNOSIS — E669 Obesity, unspecified: Secondary | ICD-10-CM | POA: Diagnosis not present

## 2015-05-10 DIAGNOSIS — E039 Hypothyroidism, unspecified: Secondary | ICD-10-CM | POA: Diagnosis not present

## 2015-05-10 DIAGNOSIS — Z880 Allergy status to penicillin: Secondary | ICD-10-CM | POA: Insufficient documentation

## 2015-05-10 DIAGNOSIS — I2510 Atherosclerotic heart disease of native coronary artery without angina pectoris: Secondary | ICD-10-CM | POA: Insufficient documentation

## 2015-05-10 DIAGNOSIS — E785 Hyperlipidemia, unspecified: Secondary | ICD-10-CM | POA: Diagnosis not present

## 2015-05-10 DIAGNOSIS — R52 Pain, unspecified: Secondary | ICD-10-CM

## 2015-05-10 DIAGNOSIS — Z87891 Personal history of nicotine dependence: Secondary | ICD-10-CM | POA: Insufficient documentation

## 2015-05-10 DIAGNOSIS — R1032 Left lower quadrant pain: Secondary | ICD-10-CM | POA: Diagnosis present

## 2015-05-10 DIAGNOSIS — Z79899 Other long term (current) drug therapy: Secondary | ICD-10-CM | POA: Insufficient documentation

## 2015-05-10 LAB — I-STAT CHEM 8, ED
BUN: 22 mg/dL — ABNORMAL HIGH (ref 6–20)
CALCIUM ION: 1.21 mmol/L (ref 1.13–1.30)
CHLORIDE: 101 mmol/L (ref 101–111)
Creatinine, Ser: 1.2 mg/dL (ref 0.61–1.24)
Glucose, Bld: 144 mg/dL — ABNORMAL HIGH (ref 65–99)
HCT: 45 % (ref 39.0–52.0)
HEMOGLOBIN: 15.3 g/dL (ref 13.0–17.0)
POTASSIUM: 3.8 mmol/L (ref 3.5–5.1)
Sodium: 139 mmol/L (ref 135–145)
TCO2: 25 mmol/L (ref 0–100)

## 2015-05-10 LAB — CBC WITH DIFFERENTIAL/PLATELET
BASOS ABS: 0 10*3/uL (ref 0.0–0.1)
Basophils Relative: 0 % (ref 0–1)
EOS ABS: 0 10*3/uL (ref 0.0–0.7)
Eosinophils Relative: 0 % (ref 0–5)
HCT: 42.1 % (ref 39.0–52.0)
HEMOGLOBIN: 14 g/dL (ref 13.0–17.0)
Lymphocytes Relative: 11 % — ABNORMAL LOW (ref 12–46)
Lymphs Abs: 0.9 10*3/uL (ref 0.7–4.0)
MCH: 29 pg (ref 26.0–34.0)
MCHC: 33.3 g/dL (ref 30.0–36.0)
MCV: 87.2 fL (ref 78.0–100.0)
MONO ABS: 0.6 10*3/uL (ref 0.1–1.0)
Monocytes Relative: 8 % (ref 3–12)
NEUTROS ABS: 6.7 10*3/uL (ref 1.7–7.7)
NEUTROS PCT: 81 % — AB (ref 43–77)
Platelets: 203 10*3/uL (ref 150–400)
RBC: 4.83 MIL/uL (ref 4.22–5.81)
RDW: 14.4 % (ref 11.5–15.5)
WBC: 8.3 10*3/uL (ref 4.0–10.5)

## 2015-05-10 LAB — URINALYSIS, ROUTINE W REFLEX MICROSCOPIC
BILIRUBIN URINE: NEGATIVE
GLUCOSE, UA: NEGATIVE mg/dL
Ketones, ur: NEGATIVE mg/dL
Leukocytes, UA: NEGATIVE
Nitrite: NEGATIVE
Protein, ur: NEGATIVE mg/dL
Specific Gravity, Urine: 1.026 (ref 1.005–1.030)
Urobilinogen, UA: 1 mg/dL (ref 0.0–1.0)
pH: 6.5 (ref 5.0–8.0)

## 2015-05-10 LAB — URINE MICROSCOPIC-ADD ON

## 2015-05-10 MED ORDER — SODIUM CHLORIDE 0.9 % IV BOLUS (SEPSIS)
500.0000 mL | Freq: Once | INTRAVENOUS | Status: AC
  Administered 2015-05-10: 500 mL via INTRAVENOUS

## 2015-05-10 MED ORDER — OXYCODONE-ACETAMINOPHEN 5-325 MG PO TABS
1.0000 | ORAL_TABLET | Freq: Once | ORAL | Status: AC
  Administered 2015-05-10: 1 via ORAL
  Filled 2015-05-10: qty 1

## 2015-05-10 MED ORDER — ONDANSETRON HCL 4 MG/2ML IJ SOLN
4.0000 mg | Freq: Once | INTRAMUSCULAR | Status: AC
  Administered 2015-05-10: 4 mg via INTRAVENOUS
  Filled 2015-05-10: qty 2

## 2015-05-10 MED ORDER — KETOROLAC TROMETHAMINE 30 MG/ML IJ SOLN
30.0000 mg | Freq: Once | INTRAMUSCULAR | Status: AC
  Administered 2015-05-10: 30 mg via INTRAVENOUS
  Filled 2015-05-10: qty 1

## 2015-05-10 MED ORDER — ONDANSETRON 8 MG PO TBDP: ORAL_TABLET | ORAL | Status: DC

## 2015-05-10 MED ORDER — OXYCODONE-ACETAMINOPHEN 5-325 MG PO TABS: 1.0000 | ORAL_TABLET | Freq: Four times a day (QID) | ORAL | Status: DC | PRN

## 2015-05-10 NOTE — ED Notes (Signed)
Strainer given  

## 2015-05-10 NOTE — ED Notes (Signed)
Pt to CT

## 2015-05-10 NOTE — ED Notes (Signed)
MD at bedside. 

## 2015-05-10 NOTE — ED Provider Notes (Signed)
CSN: [id number]     Arrival date & time 05/10/15  0334 History   First MD Initiated Contact with Patient 05/10/15 0415     Chief Complaint  Patient presents with  . Abdominal Pain     (Consider location/radiation/quality/duration/timing/severity/associated sxs/prior Treatment) Patient is a 74 y.o. male presenting with abdominal pain. The history is provided by the patient.  Abdominal Pain Pain location:  L flank and LLQ Pain quality: aching   Pain radiates to:  Does not radiate Pain severity:  Moderate Onset quality:  Gradual Timing:  Constant Progression:  Unchanged Chronicity:  New Context: not trauma   Relieved by:  Nothing Worsened by:  Nothing tried Ineffective treatments:  None tried Associated symptoms comment:  Difficulty urinating Risk factors: being elderly     Past Medical History  Diagnosis Date  . Hypertension   . Hyperlipidemia   . Diabetes mellitus without complication   . Hypothyroidism   . Obesity   . Cataract   . Kidney stones   . CAD (coronary artery disease)    Past Surgical History  Procedure Laterality Date  . Coronary artery bypass graft      CABG X 6, 1998, WITH LIMA  to the LAD, other undefined SVG's  . Cardiac catheterization  06/16/13    Severe native vessel occlusive disease with ostial total occlusion of the right coronary, distal 99% occlusion of the left main, and evidence of heavy-use coronary calcification in the left main LAD circumflex and RCA  . Left heart catheterization with coronary/graft angiogram  06/16/2013    Procedure: LEFT HEART CATHETERIZATION WITH Isabel CapriceORONARY/GRAFT ANGIOGRAM;  Surgeon: Lesleigh NoeHenry W Smith III, MD;  [facility];  Service: Cardiovascular;;   Family History  Problem Relation Age of Onset  . Tuberculosis Mother   . CAD Father    History  Substance Use Topics  . Smoking status: Former Smoker    Types: Cigarettes    Quit date: 10/26/1966  . Smokeless tobacco: Not on file  . Alcohol Use: No    Review  of Systems  Gastrointestinal: Positive for abdominal pain.  All other systems reviewed and are negative.     Allergies  Penicillins  Home Medications   Prior to Admission medications   Medication Sig Start Date End Date Taking? Authorizing Provider  amLODipine (NORVASC) 5 MG tablet TAKE 1 TABLET BY MOUTH EVERY DAY    Lyn RecordsHenry W Smith, MD  aspirin EC 325 MG tablet Take 325 mg by mouth daily.    Historical Provider, MD  atorvastatin (LIPITOR) 80 MG tablet Take 40 mg by mouth daily.     Historical Provider, MD  finasteride (PROSCAR) 5 MG tablet Take 5 mg by mouth daily.    Historical Provider, MD  Flaxseed, Linseed, (FLAX SEEDS PO) Take 1 capsule by mouth daily.    Historical Provider, MD  hydrochlorothiazide (HYDRODIURIL) 25 MG tablet Take 25 mg by mouth daily.    Historical Provider, MD  levothyroxine (SYNTHROID, LEVOTHROID) 88 MCG tablet Take 88 mcg by mouth daily before breakfast.    Historical Provider, MD  lisinopril (PRINIVIL,ZESTRIL) 40 MG tablet Take 40 mg by mouth daily.    Historical Provider, MD  metFORMIN (GLUCOPHAGE) 500 MG tablet Take 500 mg by mouth 2 (two) times daily with a meal.    Historical Provider, MD  metoprolol succinate (TOPROL-XL) 50 MG 24 hr tablet Take 50 mg by mouth daily. Take with or immediately following a meal.    Historical Provider, MD  nitroGLYCERIN (NITROSTAT)  0.4 MG SL tablet Place 1 tablet (0.4 mg total) under the tongue every 5 (five) minutes as needed for chest pain. 05/21/14   Lyn Records, MD  potassium chloride SA (K-DUR,KLOR-CON) 20 MEQ tablet Take 20 mEq by mouth daily.    Historical Provider, MD   BP 173/74 mmHg  Pulse 75  Temp(Src) 98.2 F (36.8 C) (Oral)  Resp 18  Ht  (1.651 m)  Wt 198 lb (89.812 kg)  BMI 32.95 kg/m2  SpO2 99% Physical Exam  Constitutional: He is oriented to person, place, and time. He appears well-developed and well-nourished. No distress.  HENT:  Head: Normocephalic and atraumatic.  Mouth/Throat: Oropharynx is  clear and moist.  Eyes: Conjunctivae are normal. Pupils are equal, round, and reactive to light.  Neck: Normal range of motion. Neck supple.  Cardiovascular: Normal rate, regular rhythm and intact distal pulses.   Pulmonary/Chest: Effort normal and breath sounds normal. No respiratory distress. He has no wheezes. He has no rales.  Abdominal: Soft. Bowel sounds are normal. He exhibits no mass. There is no tenderness. There is no rebound and no guarding.  Musculoskeletal: Normal range of motion.  Neurological: He is alert and oriented to person, place, and time. He has normal reflexes.  Skin: Skin is warm and dry.  Psychiatric: He has a normal mood and affect.    ED Course  Procedures (including critical care time) Labs Review Labs Reviewed  CBC WITH DIFFERENTIAL/PLATELET  URINALYSIS, ROUTINE W REFLEX MICROSCOPIC (NOT AT [facility])  I-STAT CHEM 8, ED    Imaging Review No results found.   EKG Interpretation None      MDM   Final diagnoses:  Pain    Kidney stone: percocet, zofran PRN nausea, strain all urine and follow up with [facility] in 7 days for recheck   Maily Debarge, MD 05/10/15 [phone]

## 2015-05-10 NOTE — ED Notes (Signed)
Patient reports LLQ pain that began yesterday morning .  He reports trouble urinating as well.  Denies N/V/D.

## 2015-05-10 NOTE — ED Notes (Signed)
GIVEN WITH FOOD. DELAY WITH D/C. PT HAS NEVER HAD PERCOCET.

## 2015-05-21 NOTE — Progress Notes (Signed)
Cardiology Office Note   Date:  05/22/2015   ID:  [name] [name], DOB [birth year], MRN [id number]  PCP:  Katy Apo, MD  Cardiologist:  Lesleigh Noe, MD   Chief Complaint  Patient presents with  . Coronary Artery Disease      History of Present Illness: [name] [name] is a 74 y.o. male who presents for coronary artery disease, hypertension, chronic kidney disease, and prior coronary artery bypass grafting.  The patient has intermittent angina. He states that this does not impair his quality of life. He is able remain active in his lifestyle. He denies lower extremity edema. He has not had syncope or orthopnea.  Nitroglycerin use his been sparing.    Past Medical History  Diagnosis Date  . Hypertension   . Hyperlipidemia   . Diabetes mellitus without complication   . Hypothyroidism   . Obesity   . Cataract   . Kidney stones   . CAD (coronary artery disease)     Past Surgical History  Procedure Laterality Date  . Coronary artery bypass graft      CABG X 6, 1998, WITH LIMA  to the LAD, other undefined SVG's  . Cardiac catheterization  06/16/13    Severe native vessel occlusive disease with ostial total occlusion of the right coronary, distal 99% occlusion of the left main, and evidence of heavy-use coronary calcification in the left main LAD circumflex and RCA  . Left heart catheterization with coronary/graft angiogram  06/16/2013    Procedure: LEFT HEART CATHETERIZATION WITH Isabel Caprice;  Surgeon: Lesleigh Noe, MD;  Location: [facility] CATH LAB;  Service: Cardiovascular;;     Current Outpatient Prescriptions  Medication Sig Dispense Refill  . amLODipine (NORVASC) 5 MG tablet TAKE 1 TABLET BY MOUTH EVERY DAY 90 tablet 0  . aspirin EC 325 MG tablet Take 325 mg by mouth daily.    Marland Kitchen atorvastatin (LIPITOR) 80 MG tablet Take 40 mg by mouth daily.     . finasteride (PROSCAR) 5 MG tablet Take 5 mg by mouth daily.    . Flaxseed, Linseed, (FLAX SEEDS PO)  Take 1 capsule by mouth daily.    . hydrochlorothiazide (HYDRODIURIL) 25 MG tablet Take 25 mg by mouth daily.    Marland Kitchen levothyroxine (SYNTHROID, LEVOTHROID) 88 MCG tablet Take 88 mcg by mouth daily before breakfast.    . lisinopril (PRINIVIL,ZESTRIL) 40 MG tablet Take 40 mg by mouth daily.    . metFORMIN (GLUCOPHAGE) 500 MG tablet Take 500 mg by mouth 2 (two) times daily with a meal.    . metoprolol succinate (TOPROL-XL) 50 MG 24 hr tablet Take 50 mg by mouth daily. Take with or immediately following a meal.    . nitroGLYCERIN (NITROSTAT) 0.4 MG SL tablet Place 1 tablet (0.4 mg total) under the tongue every 5 (five) minutes as needed for chest pain. 25 tablet 3  . ondansetron (ZOFRAN ODT) 8 MG disintegrating tablet 8mg  ODT q8 hours prn nausea 4 tablet 0  . oxyCODONE-acetaminophen (PERCOCET/ROXICET) 5-325 MG per tablet Take 1 tablet by mouth every 6 (six) hours as needed for moderate pain or severe pain.    . potassium chloride SA (K-DUR,KLOR-CON) 20 MEQ tablet Take 20 mEq by mouth daily.     No current facility-administered medications for this visit.    Allergies:   Penicillins    Social History:  The patient  reports that he quit smoking about 48 years ago. His smoking use included Cigarettes. He has  never used smokeless tobacco. He reports that he does not drink alcohol or use illicit drugs.   Family History:  The patient's family history includes CAD in his father; Tuberculosis in his mother.    ROS:  Please see the history of present illness.   Otherwise, review of systems are positive for none.   All other systems are reviewed and negative.    PHYSICAL EXAM: VS:  BP 132/72 mmHg  Pulse 45  Ht  (1.651 m)  Wt 88.814 kg (195 lb 12.8 oz)  BMI 32.58 kg/m2 , BMI Body mass index is 32.58 kg/(m^2). GEN: Well nourished, well developed, in no acute distress HEENT: normal Neck: no JVD, carotid bruits, or masses Cardiac: RRR; no murmurs, rubs, or gallops,no edema  Respiratory:  clear to  auscultation bilaterally, normal work of breathing GI: soft, nontender, nondistended, + BS MS: no deformity or atrophy Skin: warm and dry, no rash Neuro:  Strength and sensation are intact Psych: euthymic mood, full affect   EKG:  EKG is ordered today. The ekg ordered today demonstrates normal sinus rhythm with right bundle branch block.   Recent Labs: 05/10/2015: BUN 22*; Creatinine, Ser 1.20; Hemoglobin 15.3; Platelets 203; Potassium 3.8; Sodium 139    Lipid Panel No results found for: CHOL, TRIG, HDL, CHOLHDL, VLDL, LDLCALC, LDLDIRECT    Wt Readings from Last 3 Encounters:  05/22/15 88.814 kg (195 lb 12.8 oz)  05/10/15 89.812 kg (198 lb)  12/21/14 90.22 kg (198 lb 14.4 oz)      Other studies Reviewed: Additional studies/ records that were reviewed today include: . Review of the above records demonstrates:    ASSESSMENT AND PLAN:  1. Exertional angina Clinically stable  2. Atherosclerosis of coronary artery bypass graft of native heart without angina pectoris Clinically stable as mentioned above  3. Systolic murmur compatible with aortic stenosis Need to do echocardiogram to exclude aortic stenosis. Will discuss echo results when available.  4. Pure hypercholesterolemia Followed by primary care  5. Right bundle branch block Not reassessed today  6. Essential hypertension, benign Adequate control    Current medicines are reviewed at length with the patient today.  The patient does not have concerns regarding medicines.  The following changes have been made:  no change  Labs/ tests ordered today include:   Orders Placed This Encounter  Procedures  . EKG 12-Lead  . Echocardiogram     Disposition:   FU with HS in  one year Signed, Lesleigh Noe, MD  05/22/2015 11:59 AM    [facility] [address] [place], [place], [place]  [address] Phone: [phone]; Fax: [phone]

## 2015-05-22 ENCOUNTER — Encounter: Payer: Self-pay | Admitting: Interventional Cardiology

## 2015-05-22 ENCOUNTER — Ambulatory Visit (INDEPENDENT_AMBULATORY_CARE_PROVIDER_SITE_OTHER): Payer: PPO | Admitting: Interventional Cardiology

## 2015-05-22 VITALS — BP 132/72 | HR 45 | Ht 65.0 in | Wt 195.8 lb

## 2015-05-22 DIAGNOSIS — E7800 Pure hypercholesterolemia, unspecified: Secondary | ICD-10-CM

## 2015-05-22 DIAGNOSIS — I25810 Atherosclerosis of coronary artery bypass graft(s) without angina pectoris: Secondary | ICD-10-CM | POA: Diagnosis not present

## 2015-05-22 DIAGNOSIS — I358 Other nonrheumatic aortic valve disorders: Secondary | ICD-10-CM

## 2015-05-22 DIAGNOSIS — I208 Other forms of angina pectoris: Secondary | ICD-10-CM | POA: Diagnosis not present

## 2015-05-22 DIAGNOSIS — I10 Essential (primary) hypertension: Secondary | ICD-10-CM | POA: Diagnosis not present

## 2015-05-22 DIAGNOSIS — I340 Nonrheumatic mitral (valve) insufficiency: Secondary | ICD-10-CM | POA: Insufficient documentation

## 2015-05-22 NOTE — Patient Instructions (Signed)
Medication Instructions:  Your physician recommends that you continue on your current medications as directed. Please refer to the Current Medication list given to you today.  Labwork: None   Testing/Procedures: Your physician has requested that you have an echocardiogram. Echocardiography is a painless test that uses sound waves to create images of your heart. It provides your doctor with information about the size and shape of your heart and how well your heart's chambers and valves are working. This procedure takes approximately one hour. There are no restrictions for this procedure.  Follow-Up: Your physician wants you to follow-up in: 1 year with Dr.Smith You will receive a reminder letter in the mail two months in advance. If you don't receive a letter, please call our office to schedule the follow-up appointment.   Any Other Special Instructions Will Be Listed Below (If Applicable). Your physician discussed the importance of regular exercise and recommended that you start or continue a regular exercise program for good health.

## 2015-05-23 ENCOUNTER — Other Ambulatory Visit: Payer: Self-pay

## 2015-05-23 ENCOUNTER — Ambulatory Visit (HOSPITAL_COMMUNITY): Payer: PPO | Attending: Cardiology

## 2015-05-23 ENCOUNTER — Encounter: Payer: Self-pay | Admitting: Interventional Cardiology

## 2015-05-23 DIAGNOSIS — I351 Nonrheumatic aortic (valve) insufficiency: Secondary | ICD-10-CM | POA: Diagnosis not present

## 2015-05-23 DIAGNOSIS — I10 Essential (primary) hypertension: Secondary | ICD-10-CM | POA: Insufficient documentation

## 2015-05-23 DIAGNOSIS — I517 Cardiomegaly: Secondary | ICD-10-CM | POA: Diagnosis not present

## 2015-05-23 DIAGNOSIS — I77819 Aortic ectasia, unspecified site: Secondary | ICD-10-CM | POA: Insufficient documentation

## 2015-05-23 DIAGNOSIS — I340 Nonrheumatic mitral (valve) insufficiency: Secondary | ICD-10-CM | POA: Diagnosis not present

## 2015-05-23 DIAGNOSIS — I358 Other nonrheumatic aortic valve disorders: Secondary | ICD-10-CM | POA: Diagnosis not present

## 2015-05-23 DIAGNOSIS — R011 Cardiac murmur, unspecified: Secondary | ICD-10-CM | POA: Diagnosis present

## 2015-05-27 ENCOUNTER — Telehealth: Payer: Self-pay

## 2015-05-27 NOTE — Telephone Encounter (Signed)
-----   Message from Lyn Records, MD sent at 05/23/2015  7:19 PM EDT ----- Echo is okay and there is nothing to worry about.

## 2015-05-27 NOTE — Telephone Encounter (Signed)
Pt aware of echo results. Echo is okay and there is nothing to worry about. Pt appreciative for the call and verbalized understanding.

## 2015-08-07 ENCOUNTER — Other Ambulatory Visit: Payer: Self-pay | Admitting: Urology

## 2015-08-08 ENCOUNTER — Encounter (HOSPITAL_COMMUNITY): Payer: Self-pay | Admitting: *Deleted

## 2015-08-08 ENCOUNTER — Other Ambulatory Visit: Payer: Self-pay | Admitting: Urology

## 2015-08-09 ENCOUNTER — Other Ambulatory Visit: Payer: Self-pay | Admitting: Urology

## 2015-08-11 MED ORDER — GENTAMICIN SULFATE 40 MG/ML IJ SOLN
5.0000 mg/kg | INTRAVENOUS | Status: DC
  Administered 2015-08-12: 360 mg via INTRAVENOUS
  Filled 2015-08-11: qty 9

## 2015-08-12 ENCOUNTER — Encounter (HOSPITAL_COMMUNITY): Payer: Self-pay

## 2015-08-12 ENCOUNTER — Ambulatory Visit (HOSPITAL_COMMUNITY)
Admission: RE | Admit: 2015-08-12 | Discharge: 2015-08-12 | Disposition: A | Discharge status: Home / Self Care | Payer: PPO | Source: Ambulatory Visit | Attending: Urology | Admitting: Urology

## 2015-08-12 ENCOUNTER — Encounter (HOSPITAL_COMMUNITY)
Admission: RE | Disposition: A | Discharge status: Home / Self Care | Payer: Self-pay | Source: Ambulatory Visit | Attending: Urology

## 2015-08-12 ENCOUNTER — Ambulatory Visit (HOSPITAL_COMMUNITY): Payer: PPO

## 2015-08-12 DIAGNOSIS — N132 Hydronephrosis with renal and ureteral calculous obstruction: Secondary | ICD-10-CM | POA: Diagnosis present

## 2015-08-12 DIAGNOSIS — N201 Calculus of ureter: Secondary | ICD-10-CM

## 2015-08-12 DIAGNOSIS — I10 Essential (primary) hypertension: Secondary | ICD-10-CM | POA: Diagnosis not present

## 2015-08-12 DIAGNOSIS — I2510 Atherosclerotic heart disease of native coronary artery without angina pectoris: Secondary | ICD-10-CM | POA: Diagnosis not present

## 2015-08-12 DIAGNOSIS — E669 Obesity, unspecified: Secondary | ICD-10-CM | POA: Insufficient documentation

## 2015-08-12 DIAGNOSIS — Z880 Allergy status to penicillin: Secondary | ICD-10-CM | POA: Insufficient documentation

## 2015-08-12 DIAGNOSIS — N401 Enlarged prostate with lower urinary tract symptoms: Secondary | ICD-10-CM | POA: Insufficient documentation

## 2015-08-12 DIAGNOSIS — Z87891 Personal history of nicotine dependence: Secondary | ICD-10-CM | POA: Insufficient documentation

## 2015-08-12 DIAGNOSIS — Z87442 Personal history of urinary calculi: Secondary | ICD-10-CM | POA: Insufficient documentation

## 2015-08-12 DIAGNOSIS — E119 Type 2 diabetes mellitus without complications: Secondary | ICD-10-CM | POA: Insufficient documentation

## 2015-08-12 DIAGNOSIS — Z6832 Body mass index (BMI) 32.0-32.9, adult: Secondary | ICD-10-CM | POA: Insufficient documentation

## 2015-08-12 DIAGNOSIS — E039 Hypothyroidism, unspecified: Secondary | ICD-10-CM | POA: Insufficient documentation

## 2015-08-12 DIAGNOSIS — Z7982 Long term (current) use of aspirin: Secondary | ICD-10-CM | POA: Insufficient documentation

## 2015-08-12 DIAGNOSIS — Z951 Presence of aortocoronary bypass graft: Secondary | ICD-10-CM | POA: Insufficient documentation

## 2015-08-12 DIAGNOSIS — E785 Hyperlipidemia, unspecified: Secondary | ICD-10-CM | POA: Diagnosis not present

## 2015-08-12 HISTORY — PX: EXTRACORPOREAL SHOCK WAVE LITHOTRIPSY: SHX1557

## 2015-08-12 LAB — CREATININE, SERUM
Creatinine, Ser: 0.85 mg/dL (ref 0.61–1.24)
GFR calc Af Amer: >60 mL/min (ref >60)

## 2015-08-12 LAB — GLUCOSE, CAPILLARY: Glucose-Capillary: 122 mg/dL — ABNORMAL HIGH (ref 65–99)

## 2015-08-12 SURGERY — LITHOTRIPSY, ESWL
Anesthesia: LOCAL | Laterality: Left

## 2015-08-12 MED ORDER — DIAZEPAM 5 MG PO TABS
10.0000 mg | ORAL_TABLET | ORAL | Status: AC
  Administered 2015-08-12: 10 mg via ORAL
  Filled 2015-08-12: qty 2

## 2015-08-12 MED ORDER — SODIUM CHLORIDE 0.9 % IV SOLN
INTRAVENOUS | Status: DC
  Administered 2015-08-12: 10:00:00 via INTRAVENOUS

## 2015-08-12 MED ORDER — OXYCODONE-ACETAMINOPHEN 5-325 MG PO TABS: 1.0000 | ORAL_TABLET | Freq: Four times a day (QID) | ORAL | Status: DC | PRN

## 2015-08-12 MED ORDER — SENNOSIDES-DOCUSATE SODIUM 8.6-50 MG PO TABS: 1.0000 | ORAL_TABLET | Freq: Two times a day (BID) | ORAL | Status: DC

## 2015-08-12 MED ORDER — DIPHENHYDRAMINE HCL 25 MG PO CAPS
25.0000 mg | ORAL_CAPSULE | ORAL | Status: AC
  Administered 2015-08-12: 25 mg via ORAL
  Filled 2015-08-12: qty 1

## 2015-08-12 NOTE — Discharge Instructions (Signed)
1 - You may have urinary urgency (bladder spasms), pass small stone fragments, and have bloody urine on / off x few days. This is normal.  2 - Call MD or go to ER for fever >102, severe pain / nausea / vomiting not relieved by medications, or acute change in medical status

## 2015-08-12 NOTE — H&P (Signed)
[name] [name] is an 74 y.o. male.    Chief Complaint: Pre-OP Left Shockwave Lithotripsy  HPI:       1- Nephrolithiasis - Left 5mm UVJ stone with mild hydro (SSD 11cm, 650 HU) visitble on scout images neat sacrum at upper 1/4 femoral head level 04/2015. No additional left sided stones, punctate Rt 2mm intra-renals stone by ER CT on eval abd pain. One prior episdoe colic with stone passed medically in 1990s.  Given trial of medical therapy with tamsulosin and pain meds and reports no interval passage, but pain controlled. Stone in essentially unchanged position by KUB 06/18/15 and again 08/06/2015.  2 - Enlarged Prostate with Lower Urinary Tract Symptoms - on finasteride per PCP for outlet sympotms with great control, now minimal bother.   PMH sig fro CAD/CABG (now asymptomatic, follows Verdis Prime MD, on ASA 81 daily), DM2 (metforin only), HTN. His PCP is Renford Dills MD  Today " [name] [name] " is seen to proceed with left SWL for his UVJ stone that has failed trial of medical therapy. No interval fevers. Most recent UA without infectious parameters.     Past Medical History  Diagnosis Date  . Hypertension   . Hyperlipidemia   . Diabetes mellitus without complication (HCC)   . Hypothyroidism   . Obesity   . Cataract   . Kidney stones   . CAD (coronary artery disease)     Past Surgical History  Procedure Laterality Date  . Coronary artery bypass graft      CABG X 6, 1998, WITH LIMA  to the LAD, other undefined SVG's  . Cardiac catheterization  06/16/13    Severe native vessel occlusive disease with ostial total occlusion of the right coronary, distal 99% occlusion of the left main, and evidence of heavy-use coronary calcification in the left main LAD circumflex and RCA  . Left heart catheterization with coronary/graft angiogram  06/16/2013    Procedure: LEFT HEART CATHETERIZATION WITH Isabel Caprice;  Surgeon: Lesleigh Noe, MD;  Location: [facility] CATH LAB;  Service:  Cardiovascular;;    Family History  Problem Relation Age of Onset  . Tuberculosis Mother   . CAD Father    Social History:  reports that he quit smoking about 48 years ago. His smoking use included Cigarettes. He has never used smokeless tobacco. He reports that he does not drink alcohol or use illicit drugs.  Allergies:  Allergies  Allergen Reactions  . Penicillins Rash    Has patient had a PCN reaction causing immediate rash, facial/tongue/throat swelling, SOB or lightheadedness with hypotension:no Has patient had a PCN reaction causing severe rash involving mucus membranes or skin necrosis: no Has patient had a PCN reaction that required hospitalization no Has patient had a PCN reaction occurring within the last 10 years: no If all of the above answers are "NO", then may proceed with Cephalosporin use.     No prescriptions prior to admission    No results found for this or any previous visit (from the past 48 hour(s)). No results found.  Review of Systems  Constitutional: Negative.  Negative for fever.  HENT: Negative.   Eyes: Negative.   Respiratory: Negative.   Cardiovascular: Negative.   Gastrointestinal: Negative.   Genitourinary: Negative.   Musculoskeletal: Negative.   Skin: Negative.   Neurological: Negative.   Endo/Heme/Allergies: Negative.   Psychiatric/Behavioral: Negative.     Height  (1.651 m), weight 89.812 kg (198 lb). Physical Exam  Constitutional: He appears well-developed.  HENT:  Head: Normocephalic.  Eyes: Pupils are equal, round, and reactive to light.  Neck: Normal range of motion.  Cardiovascular: Normal rate.   Respiratory: Effort normal.  GI: Soft.  Genitourinary: Penis normal.  Musculoskeletal: Normal range of motion.  Neurological: He is alert.  Skin: Skin is warm.  Psychiatric: He has a normal mood and affect. His behavior is normal. Judgment and thought content normal.     Assessment/Plan    1- Nephrolithiasis -  perisistant but minimally symptomatic left ureteral stone, this is persistant despite >6weeks medical therapy. Rec definitive management with URS v. SWL to avoid long-term renal functional decline.  He wants to proceed with left SWL. He will stop ASA 5 days prior.   We rediscussed shockwave lithotripsy in detail as well as my "rule of 9s" with stones <669mm, less than 900 HU, and skin to stone distance <9cm having approximately 90% treatment success with single session of treatment. We then readdressed how stones that are larger, more dense, and in patients with less favorable anatomy have incrementally decreased success rates. Were discussed risks including, bleeding, infection, hematoma, loss of kidney, need for staged therapy, need for adjunctive therapy and requirement to refrain from any anticoagulants, anti-platelet or aspirin-like products peri-procedureally. After careful consideration, the patient has chosen to proceed.   2 - Enlarged Prostate with Lower Urinary Tract Symptoms - doing well on current regimen, continue.   3 - RTC 6 weeks after lithotripsy  Laryah Neuser 08/12/2015, 5:54 AM

## 2015-11-21 ENCOUNTER — Other Ambulatory Visit: Payer: Self-pay | Admitting: Urology

## 2015-11-21 DIAGNOSIS — N201 Calculus of ureter: Secondary | ICD-10-CM | POA: Diagnosis not present

## 2015-11-21 DIAGNOSIS — N1330 Unspecified hydronephrosis: Secondary | ICD-10-CM | POA: Diagnosis not present

## 2015-11-21 DIAGNOSIS — N132 Hydronephrosis with renal and ureteral calculous obstruction: Secondary | ICD-10-CM | POA: Diagnosis not present

## 2015-11-21 DIAGNOSIS — R110 Nausea: Secondary | ICD-10-CM | POA: Diagnosis not present

## 2015-11-21 DIAGNOSIS — Z0000 Encounter for general adult medical examination without abnormal findings: Secondary | ICD-10-CM | POA: Diagnosis not present

## 2015-11-21 DIAGNOSIS — R1032 Left lower quadrant pain: Secondary | ICD-10-CM | POA: Diagnosis not present

## 2015-11-22 ENCOUNTER — Encounter (HOSPITAL_BASED_OUTPATIENT_CLINIC_OR_DEPARTMENT_OTHER): Payer: Self-pay | Admitting: *Deleted

## 2015-11-25 ENCOUNTER — Encounter (HOSPITAL_BASED_OUTPATIENT_CLINIC_OR_DEPARTMENT_OTHER): Payer: Self-pay | Admitting: *Deleted

## 2015-11-25 NOTE — Progress Notes (Signed)
NPO AFTER MN.  ARRIVE AT 1100.  NEEDS ISTAT 8.  CURRENT EKG IN CHART AND EPIC.  WILL TAKE LIPITOR, NORVASC, METOPROLOL, SYNTHROID, AND PROSCAR AM DOS W/ SIPS OF WATER AND IF NEEDED TAKE OXYCODONE.

## 2015-11-29 ENCOUNTER — Ambulatory Visit (HOSPITAL_BASED_OUTPATIENT_CLINIC_OR_DEPARTMENT_OTHER)
Admission: RE | Admit: 2015-11-29 | Discharge: 2015-11-29 | Disposition: A | Discharge status: Home / Self Care | Payer: PPO | Source: Ambulatory Visit | Attending: Urology | Admitting: Urology

## 2015-11-29 ENCOUNTER — Ambulatory Visit (HOSPITAL_BASED_OUTPATIENT_CLINIC_OR_DEPARTMENT_OTHER): Payer: PPO | Admitting: Anesthesiology

## 2015-11-29 ENCOUNTER — Encounter (HOSPITAL_BASED_OUTPATIENT_CLINIC_OR_DEPARTMENT_OTHER)
Admission: RE | Disposition: A | Discharge status: Home / Self Care | Payer: Self-pay | Source: Ambulatory Visit | Attending: Urology

## 2015-11-29 ENCOUNTER — Encounter (HOSPITAL_BASED_OUTPATIENT_CLINIC_OR_DEPARTMENT_OTHER): Payer: Self-pay | Admitting: *Deleted

## 2015-11-29 DIAGNOSIS — K449 Diaphragmatic hernia without obstruction or gangrene: Secondary | ICD-10-CM | POA: Diagnosis not present

## 2015-11-29 DIAGNOSIS — E1151 Type 2 diabetes mellitus with diabetic peripheral angiopathy without gangrene: Secondary | ICD-10-CM | POA: Insufficient documentation

## 2015-11-29 DIAGNOSIS — I2510 Atherosclerotic heart disease of native coronary artery without angina pectoris: Secondary | ICD-10-CM | POA: Insufficient documentation

## 2015-11-29 DIAGNOSIS — N3080 Other cystitis without hematuria: Secondary | ICD-10-CM | POA: Insufficient documentation

## 2015-11-29 DIAGNOSIS — N200 Calculus of kidney: Secondary | ICD-10-CM | POA: Diagnosis not present

## 2015-11-29 DIAGNOSIS — I4510 Unspecified right bundle-branch block: Secondary | ICD-10-CM | POA: Diagnosis not present

## 2015-11-29 DIAGNOSIS — Z951 Presence of aortocoronary bypass graft: Secondary | ICD-10-CM | POA: Diagnosis not present

## 2015-11-29 DIAGNOSIS — I129 Hypertensive chronic kidney disease with stage 1 through stage 4 chronic kidney disease, or unspecified chronic kidney disease: Secondary | ICD-10-CM | POA: Diagnosis not present

## 2015-11-29 DIAGNOSIS — Z87891 Personal history of nicotine dependence: Secondary | ICD-10-CM | POA: Diagnosis not present

## 2015-11-29 DIAGNOSIS — Z87442 Personal history of urinary calculi: Secondary | ICD-10-CM | POA: Diagnosis not present

## 2015-11-29 DIAGNOSIS — E039 Hypothyroidism, unspecified: Secondary | ICD-10-CM | POA: Diagnosis not present

## 2015-11-29 DIAGNOSIS — N3020 Other chronic cystitis without hematuria: Secondary | ICD-10-CM | POA: Diagnosis not present

## 2015-11-29 DIAGNOSIS — N1330 Unspecified hydronephrosis: Secondary | ICD-10-CM | POA: Diagnosis not present

## 2015-11-29 DIAGNOSIS — E785 Hyperlipidemia, unspecified: Secondary | ICD-10-CM | POA: Diagnosis not present

## 2015-11-29 DIAGNOSIS — N132 Hydronephrosis with renal and ureteral calculous obstruction: Secondary | ICD-10-CM | POA: Insufficient documentation

## 2015-11-29 DIAGNOSIS — Z7984 Long term (current) use of oral hypoglycemic drugs: Secondary | ICD-10-CM | POA: Diagnosis not present

## 2015-11-29 DIAGNOSIS — I10 Essential (primary) hypertension: Secondary | ICD-10-CM | POA: Diagnosis not present

## 2015-11-29 DIAGNOSIS — N401 Enlarged prostate with lower urinary tract symptoms: Secondary | ICD-10-CM | POA: Insufficient documentation

## 2015-11-29 DIAGNOSIS — N201 Calculus of ureter: Secondary | ICD-10-CM | POA: Diagnosis not present

## 2015-11-29 DIAGNOSIS — Z7982 Long term (current) use of aspirin: Secondary | ICD-10-CM | POA: Insufficient documentation

## 2015-11-29 DIAGNOSIS — N189 Chronic kidney disease, unspecified: Secondary | ICD-10-CM | POA: Diagnosis not present

## 2015-11-29 HISTORY — DX: Cardiac murmur, unspecified: R01.1

## 2015-11-29 HISTORY — DX: Localized edema: R60.0

## 2015-11-29 HISTORY — DX: Benign prostatic hyperplasia with lower urinary tract symptoms: N40.1

## 2015-11-29 HISTORY — PX: CYSTOSCOPY/RETROGRADE/URETEROSCOPY/STONE EXTRACTION WITH BASKET: SHX5317

## 2015-11-29 HISTORY — PX: CYSTOSCOPY WITH BIOPSY: SHX5122

## 2015-11-29 HISTORY — DX: Unspecified right bundle-branch block: I45.10

## 2015-11-29 HISTORY — DX: Type 2 diabetes mellitus without complications: E11.9

## 2015-11-29 HISTORY — DX: Presence of aortocoronary bypass graft: Z95.1

## 2015-11-29 HISTORY — DX: Calculus of ureter: N20.1

## 2015-11-29 HISTORY — DX: Diverticulosis of large intestine without perforation or abscess without bleeding: K57.30

## 2015-11-29 HISTORY — DX: Personal history of urinary calculi: Z87.442

## 2015-11-29 HISTORY — DX: Bradycardia, unspecified: R00.1

## 2015-11-29 HISTORY — DX: Unspecified hydronephrosis: N13.30

## 2015-11-29 HISTORY — DX: Diaphragmatic hernia without obstruction or gangrene: K44.9

## 2015-11-29 HISTORY — DX: Calculus of kidney: N20.0

## 2015-11-29 LAB — POCT I-STAT, CHEM 8
BUN: 15 mg/dL (ref 6–20)
CALCIUM ION: 1.24 mmol/L (ref 1.13–1.30)
Chloride: 101 mmol/L (ref 101–111)
Creatinine, Ser: 0.9 mg/dL (ref 0.61–1.24)
GLUCOSE: 105 mg/dL — AB (ref 65–99)
HCT: 45 % (ref 39.0–52.0)
Hemoglobin: 15.3 g/dL (ref 13.0–17.0)
POTASSIUM: 3.7 mmol/L (ref 3.5–5.1)
Sodium: 142 mmol/L (ref 135–145)
TCO2: 26 mmol/L (ref 0–100)

## 2015-11-29 LAB — GLUCOSE, CAPILLARY: GLUCOSE-CAPILLARY: 107 mg/dL — AB (ref 65–99)

## 2015-11-29 SURGERY — CYSTOSCOPY, WITH CALCULUS REMOVAL USING BASKET
Anesthesia: General | Site: Ureter

## 2015-11-29 MED ORDER — ONDANSETRON HCL 4 MG/2ML IJ SOLN
4.0000 mg | Freq: Once | INTRAMUSCULAR | Status: DC | PRN
  Filled 2015-11-29: qty 2

## 2015-11-29 MED ORDER — LIDOCAINE HCL (CARDIAC) 20 MG/ML IV SOLN
INTRAVENOUS | Status: DC | PRN
  Administered 2015-11-29: 100 mg via INTRAVENOUS

## 2015-11-29 MED ORDER — FENTANYL CITRATE (PF) 100 MCG/2ML IJ SOLN
25.0000 ug | INTRAMUSCULAR | Status: DC | PRN
  Filled 2015-11-29: qty 1

## 2015-11-29 MED ORDER — LACTATED RINGERS IV SOLN
INTRAVENOUS | Status: DC
  Administered 2015-11-29 (×2): via INTRAVENOUS
  Filled 2015-11-29: qty 1000

## 2015-11-29 MED ORDER — GENTAMICIN IN SALINE 1.6-0.9 MG/ML-% IV SOLN
80.0000 mg | INTRAVENOUS | Status: DC
  Filled 2015-11-29: qty 50

## 2015-11-29 MED ORDER — SENNOSIDES-DOCUSATE SODIUM 8.6-50 MG PO TABS: 1.0000 | ORAL_TABLET | Freq: Two times a day (BID) | ORAL | Status: DC

## 2015-11-29 MED ORDER — PHENYLEPHRINE HCL 10 MG/ML IJ SOLN
20.0000 mg | INTRAVENOUS | Status: DC | PRN
  Administered 2015-11-29: 10 ug/min via INTRAVENOUS

## 2015-11-29 MED ORDER — EPHEDRINE SULFATE 50 MG/ML IJ SOLN
INTRAMUSCULAR | Status: DC | PRN
  Administered 2015-11-29 (×3): 10 mg via INTRAVENOUS

## 2015-11-29 MED ORDER — IOHEXOL 350 MG/ML SOLN
INTRAVENOUS | Status: DC | PRN
  Administered 2015-11-29: 12 mL

## 2015-11-29 MED ORDER — EPHEDRINE SULFATE 50 MG/ML IJ SOLN
INTRAMUSCULAR | Status: AC
  Filled 2015-11-29: qty 1

## 2015-11-29 MED ORDER — PROPOFOL 10 MG/ML IV BOLUS
INTRAVENOUS | Status: AC
  Filled 2015-11-29: qty 20

## 2015-11-29 MED ORDER — ONDANSETRON HCL 4 MG/2ML IJ SOLN
INTRAMUSCULAR | Status: DC | PRN
  Administered 2015-11-29: 4 mg via INTRAVENOUS

## 2015-11-29 MED ORDER — PHENYLEPHRINE HCL 10 MG/ML IJ SOLN
INTRAMUSCULAR | Status: AC
  Filled 2015-11-29: qty 1

## 2015-11-29 MED ORDER — FENTANYL CITRATE (PF) 100 MCG/2ML IJ SOLN
INTRAMUSCULAR | Status: AC
  Filled 2015-11-29: qty 2

## 2015-11-29 MED ORDER — GENTAMICIN SULFATE 40 MG/ML IJ SOLN
5.0000 mg/kg | INTRAVENOUS | Status: DC
  Administered 2015-11-29: 360 mg via INTRAVENOUS
  Filled 2015-11-29 (×2): qty 9

## 2015-11-29 MED ORDER — FENTANYL CITRATE (PF) 100 MCG/2ML IJ SOLN
INTRAMUSCULAR | Status: DC | PRN
  Administered 2015-11-29: 50 ug via INTRAVENOUS
  Administered 2015-11-29 (×2): 25 ug via INTRAVENOUS

## 2015-11-29 MED ORDER — PROPOFOL 10 MG/ML IV BOLUS
INTRAVENOUS | Status: DC | PRN
  Administered 2015-11-29: 150 mg via INTRAVENOUS

## 2015-11-29 MED ORDER — OXYCODONE-ACETAMINOPHEN 5-325 MG PO TABS: 1.0000 | ORAL_TABLET | Freq: Four times a day (QID) | ORAL | Status: DC | PRN

## 2015-11-29 MED ORDER — SODIUM CHLORIDE 0.9 % IR SOLN
Status: DC | PRN
  Administered 2015-11-29: 2000 mL

## 2015-11-29 MED ORDER — LIDOCAINE HCL (CARDIAC) 20 MG/ML IV SOLN
INTRAVENOUS | Status: AC
  Filled 2015-11-29: qty 5

## 2015-11-29 MED ORDER — ONDANSETRON HCL 4 MG/2ML IJ SOLN
INTRAMUSCULAR | Status: AC
  Filled 2015-11-29: qty 2

## 2015-11-29 MED ORDER — SULFAMETHOXAZOLE-TRIMETHOPRIM 800-160 MG PO TABS: 1.0000 | ORAL_TABLET | Freq: Two times a day (BID) | ORAL | Status: DC

## 2015-11-29 MED ORDER — STERILE WATER FOR INJECTION IJ SOLN
INTRAMUSCULAR | Status: AC
  Filled 2015-11-29: qty 10

## 2015-11-29 MED ORDER — STERILE WATER FOR IRRIGATION IR SOLN
Status: DC | PRN
  Administered 2015-11-29: 500 mL

## 2015-11-29 SURGICAL SUPPLY — 44 items
BAG DRAIN URO-CYSTO SKYTR STRL (DRAIN) ×5 IMPLANT
BAG DRN UROCATH (DRAIN) ×3
BAG URO CATCHER STRL LF (MISCELLANEOUS) ×5 IMPLANT
BASKET LASER NITINOL 1.9FR (BASKET) ×5 IMPLANT
BASKET STNLS GEMINI 4WIRE 3FR (BASKET) IMPLANT
BASKET ZERO TIP NITINOL 2.4FR (BASKET) IMPLANT
BSKT STON RTRVL 120 1.9FR (BASKET) ×3
BSKT STON RTRVL GEM 120X11 3FR (BASKET)
BSKT STON RTRVL ZERO TP 2.4FR (BASKET)
CATH INTERMIT  6FR 70CM (CATHETERS) ×5 IMPLANT
CATH URET 5FR 28IN CONE TIP (BALLOONS)
CATH URET 5FR 28IN OPEN ENDED (CATHETERS) IMPLANT
CATH URET 5FR 70CM CONE TIP (BALLOONS) IMPLANT
CLOTH BEACON ORANGE TIMEOUT ST (SAFETY) ×5 IMPLANT
ELECT REM PT RETURN 9FT ADLT (ELECTROSURGICAL) ×5
ELECTRODE REM PT RTRN 9FT ADLT (ELECTROSURGICAL) ×1 IMPLANT
GLOVE BIO SURGEON STRL SZ 6.5 (GLOVE) ×4 IMPLANT
GLOVE BIO SURGEON STRL SZ7.5 (GLOVE) ×5 IMPLANT
GLOVE BIO SURGEONS STRL SZ 6.5 (GLOVE) ×2
GLOVE INDICATOR 6.5 STRL GRN (GLOVE) ×6 IMPLANT
GOWN STRL REUS W/ TWL LRG LVL3 (GOWN DISPOSABLE) ×7 IMPLANT
GOWN STRL REUS W/ TWL XL LVL3 (GOWN DISPOSABLE) ×3 IMPLANT
GOWN STRL REUS W/TWL LRG LVL3 (GOWN DISPOSABLE) ×15
GOWN STRL REUS W/TWL XL LVL3 (GOWN DISPOSABLE) ×5
GUIDEWIRE 0.038 PTFE COATED (WIRE) IMPLANT
GUIDEWIRE ANG ZIPWIRE 038X150 (WIRE) ×5 IMPLANT
GUIDEWIRE STR DUAL SENSOR (WIRE) ×5 IMPLANT
IV NS 1000ML (IV SOLUTION) ×20
IV NS 1000ML BAXH (IV SOLUTION) ×4 IMPLANT
KIT BALLIN UROMAX 15FX10 (LABEL) IMPLANT
KIT BALLN UROMAX 15FX4 (MISCELLANEOUS) IMPLANT
KIT BALLN UROMAX 26 75X4 (MISCELLANEOUS)
KIT ROOM TURNOVER WOR (KITS) ×5 IMPLANT
MANIFOLD NEPTUNE II (INSTRUMENTS) ×3 IMPLANT
PACK CYSTO (CUSTOM PROCEDURE TRAY) ×5 IMPLANT
SET HIGH PRES BAL DIL (LABEL)
SHEATH ACCESS URETERAL 38CM (SHEATH) ×3 IMPLANT
STENT POLARIS 5FRX26 (STENTS) ×3 IMPLANT
SYRINGE 10CC LL (SYRINGE) ×5 IMPLANT
SYRINGE IRR TOOMEY STRL 70CC (SYRINGE) IMPLANT
TUBE CONNECTING 12'X1/4 (SUCTIONS)
TUBE CONNECTING 12X1/4 (SUCTIONS) IMPLANT
TUBE FEEDING 8FR 16IN STR KANG (MISCELLANEOUS) ×3 IMPLANT
WATER STERILE IRR 3000ML UROMA (IV SOLUTION) ×3 IMPLANT

## 2015-11-29 NOTE — Transfer of Care (Signed)
Immediate Anesthesia Transfer of Care Note  Patient: [name] [name]  Procedure(s) Performed: Procedure(s) (LRB): CYSTOSCOPY/RETROGRADE/DIAGNOSTIC URETEROSCOPY/STONE EXTRACTION (Left) CYSTOSCOPY WITH BIOPSY AND FULGERATION (N/A)  Patient Location: PACU  Anesthesia Type: General  Level of Consciousness: awake, oriented, sedated and patient cooperative  Airway & Oxygen Therapy: Patient Spontanous Breathing and Patient connected to face mask oxygen  Post-op Assessment: Report given to PACU RN and Post -op Vital signs reviewed and stable  Post vital signs: Reviewed and stable  Complications: No apparent anesthesia complications

## 2015-11-29 NOTE — Anesthesia Preprocedure Evaluation (Addendum)
Anesthesia Evaluation  Patient identified by MRN, date of birth, ID band Patient awake    Reviewed: Allergy & Precautions, H&P , NPO status , Patient's Chart, lab work & pertinent test results, reviewed documented beta blocker date and time   History of Anesthesia Complications Negative for: history of anesthetic complications  Airway Mallampati: II  TM Distance: >3 FB Neck ROM: full    Dental no notable dental hx.    Pulmonary former smoker,    Pulmonary exam normal breath sounds clear to auscultation       Cardiovascular hypertension, + CAD, + CABG (1998) and + Peripheral Vascular Disease  Normal cardiovascular exam+ dysrhythmias + Valvular Problems/Murmurs  Rhythm:regular Rate:Normal  Echo 04/2015: EF 55-60%, mild AI and MR   Neuro/Psych  Neuromuscular disease    GI/Hepatic Neg liver ROS, hiatal hernia,   Endo/Other  diabetesHypothyroidism   Renal/GU Renal disease     Musculoskeletal   Abdominal   Peds  Hematology negative hematology ROS (+)   Anesthesia Other Findings NPO appropriate, denies cardiac or pulmonary acute symptoms  Reproductive/Obstetrics negative OB ROS                          Anesthesia Physical Anesthesia Plan  ASA: III  Anesthesia Plan: General   Post-op Pain Management:    Induction: Intravenous  Airway Management Planned: LMA  Additional Equipment:   Intra-op Plan:   Post-operative Plan: Extubation in OR  Informed Consent: I have reviewed the patients History and Physical, chart, labs and discussed the procedure including the risks, benefits and alternatives for the proposed anesthesia with the patient or authorized representative who has indicated his/her understanding and acceptance.   Dental Advisory Given  Plan Discussed with: Anesthesiologist and CRNA  Anesthesia Plan Comments:         Anesthesia Quick Evaluation

## 2015-11-29 NOTE — Anesthesia Procedure Notes (Signed)
Procedure Name: LMA Insertion Date/Time: 11/29/2015 11:27 AM Performed by: Renella Cunas D Pre-anesthesia Checklist: Patient identified, Emergency Drugs available, Suction available and Patient being monitored Patient Re-evaluated:Patient Re-evaluated prior to inductionOxygen Delivery Method: Circle System Utilized Preoxygenation: Pre-oxygenation with 100% oxygen Intubation Type: IV induction Ventilation: Mask ventilation without difficulty LMA: LMA inserted LMA Size: 5.0 Number of attempts: 1 Airway Equipment and Method: Bite block Placement Confirmation: positive ETCO2 Tube secured with: Tape Dental Injury: Teeth and Oropharynx as per pre-operative assessment

## 2015-11-29 NOTE — H&P (Signed)
[name] [name] is an 75 y.o. male.    Chief Complaint: Pre-op left ureteroscopy and laser lithotripsy  HPI:    1- Nephrolithiasis - left SWL 09/12/15 for 5mm UVJ stone with mild hydro (SSD 11cm, 650 HU) that failed to pass despite few mos of medical therapy.   No additional left sided stones, punctate Rt 2mm intra-renals stone by ER CT on eval abd pain. One prior episdoe colic with stone passed medically in 1990s.  Recent Course: 09/2015 KUB - left distal stone still present  2 - Enlarged Prostate with Lower Urinary Tract Symptoms - on finasteride per PCP for outlet sympotms with great control, now minimal bother.   PMH sig fro CAD/CABG (now asymptomatic, follows [name] [name], on ASA 81 daily), DM2 (metforin only), HTN. His PCP is [name] [name]  Today " [name] [name] " is seen ito proceed with left ureteroscopic stone manipulation for his persistent left ureteral stone. No interval fevers. Most recent UA without infectious parameters.   Past Medical History  Diagnosis Date  . Hypertension   . Hyperlipidemia   . Hypothyroidism   . Type 2 diabetes mellitus (HCC)   . Hydronephrosis, left   . Left ureteral stone   . History of kidney stones   . Nephrolithiasis     right side per ct  non-obstructive  . Sigmoid diverticulosis   . Hiatal hernia   . Sinus bradycardia   . RBBB (right bundle branch block)   . Benign prostatic hypertrophy with lower urinary tract symptoms (LUTS)   . S/P CABG x 6     1998  . CAD (coronary artery disease)     cardiologist-  dr [name] Prime--  s/p  cabg x6 in 1998  . Heart murmur   . Bilateral edema of lower extremity     IMPROVED W/ TED HOSE    Past Surgical History  Procedure Laterality Date  . Left heart catheterization with coronary/graft angiogram  06/16/2013    Procedure: LEFT HEART CATHETERIZATION WITH Isabel Caprice;  Surgeon: [name] Noe, [name];  Location: [facility] CATH LAB;  Service: Cardiovascular; Native vessel occlusive disease w/  ostial total occlusion RCA, dLM, & heavy calicification LM LAD CX RCA/  SVG to diagonal patent, SVG to marginal 1 patent & 2 occluded, LIMA to dLAD patent/ dCFX collateral from obtuse marginal graft/  normal LVF  . Coronary artery bypass graft  1998    CABG X 6 --   LIMA  to the LAD and  other undefined SVG's  . Transthoracic echocardiogram  05-23-2015  dr [name] [name]    mild LVH,  grade 1 diastolic dysfunction,  ef 55-060%,  mild AV calcifacation without stenosis,  mild AR and MR,  moderate LAE,  trivial PR and TR,  mild increase pulmonary pressure  . Extracorporeal shock wave lithotripsy Left 08-12-2015  . Cataract extraction w/ intraocular lens  implant, bilateral  2011    Family History  Problem Relation Age of Onset  . Tuberculosis Mother   . CAD Father    Social History:  reports that he quit smoking about 49 years ago. His smoking use included Cigarettes. He quit after 10 years of use. He has never used smokeless tobacco. He reports that he does not drink alcohol or use illicit drugs.  Allergies:  Allergies  Allergen Reactions  . Penicillins Rash    Has patient had a PCN reaction causing immediate rash, facial/tongue/throat swelling, SOB or lightheadedness with hypotension:no Has patient had a PCN  reaction causing severe rash involving mucus membranes or skin necrosis: no Has patient had a PCN reaction that required hospitalization no Has patient had a PCN reaction occurring within the last 10 years: no If all of the above answers are "NO", then may proceed with Cephalosporin use.     No prescriptions prior to admission    No results found for this or any previous visit (from the past 48 hour(s)). No results found.  Review of Systems  Constitutional: Negative.   HENT: Negative.   Eyes: Negative.   Respiratory: Negative.   Cardiovascular: Negative.   Gastrointestinal: Negative.   Genitourinary: Positive for flank pain.  Musculoskeletal: Negative.   Skin: Negative.    Neurological: Negative.   Endo/Heme/Allergies: Negative.   Psychiatric/Behavioral: Negative.     Height  (1.651 m), weight 88.451 kg (195 lb). Physical Exam  Constitutional: He appears well-developed.  HENT:  Head: Normocephalic.  Eyes: Pupils are equal, round, and reactive to light.  Neck: Normal range of motion.  Cardiovascular: Normal rate.   Respiratory: Effort normal.  GI: Soft.  Genitourinary:  Mild left CVAT  Musculoskeletal: Normal range of motion.  Neurological: He is alert.  Skin: Skin is warm.  Psychiatric: He has a normal mood and affect. His behavior is normal. Judgment and thought content normal.     Assessment/Plan  1- Nephrolithiasis - left distal stone persistant. Having intermitant colic. Rec proceed with left ureteroscopoic stone manipulation today as planned.   We discussed ureteroscopic stone manipulation with basketing and laser-lithotripsy in detail.  We discussed risks including bleeding, infection, damage to kidney / ureter  bladder, rarely loss of kidney. We discussed anesthetic risks and rare but serious surgical complications including DVT, PE, MI, and mortality. We specifically addressed that in 5-10% of cases a staged approach is required with stenting followed by re-attempt ureteroscopy if anatomy unfavorable.   The patient voiced understanding and wishes to proceed.   2 - Enlarged Prostate with Lower Urinary Tract Symptoms - doing well on current regimen, continue.     Sebastian Ache, [name] 11/29/2015, 5:52 AM

## 2015-11-29 NOTE — Brief Op Note (Signed)
11/29/2015  12:18 PM  PATIENT:  [name] [name]  75 y.o. male  PRE-OPERATIVE DIAGNOSIS:  LEFT HYDRONEPHROSIS, LEFT URETERAL STONE  POST-OPERATIVE DIAGNOSIS:  LEFT HYDRONEPHROSIS, LEFT URETERAL STONE  PROCEDURE:  Procedure(s): CYSTOSCOPY/RETROGRADE/DIAGNOSTIC URETEROSCOPY/STONE EXTRACTION (Left) CYSTOSCOPY WITH BIOPSY AND FULGERATION (N/A)  SURGEON:  Surgeon(s) and Role:    * Sebastian Ache, MD - Primary  PHYSICIAN ASSISTANT:   ASSISTANTS: none   ANESTHESIA:   general  EBL:  Total I/O In: 200 [I.V.:200] Out: -   BLOOD ADMINISTERED:none  DRAINS: none   LOCAL MEDICATIONS USED:  NONE  SPECIMEN:  Source of Specimen:  left ureteral stone  DISPOSITION OF SPECIMEN:  [facility] for compositional analysis  COUNTS:  YES  TOURNIQUET:  * No tourniquets in log *  DICTATION: .Other Dictation: Dictation Number [id number]  PLAN OF CARE: Discharge to home after PACU  PATIENT DISPOSITION:  PACU - hemodynamically stable.   Delay start of Pharmacological VTE agent (>24hrs) due to surgical blood loss or risk of bleeding: yes

## 2015-11-29 NOTE — Discharge Instructions (Signed)
1 - You may have urinary urgency (bladder spasms) and bloody urine on / off with stent in place. This is normal.  2 - Call MD or go to ER for fever >102, severe pain / nausea / vomiting not relieved by medications, or acute change in medical status  3 - Remove tethered stent on Monday morning at home by pulling on string, then blue-white plastic tubing and discarding. Dr. Berneice Heinrich is in the office Monday if any issues arise.  4 - Dr. Berneice Heinrich will call you with pathology results from bladder biopsy when available.     [facility] [phone] Post Ureteroscopy With or Without Stent Instructions  Definitions:  Ureter: The duct that transports urine from the kidney to the bladder. Stent:   A plastic hollow tube that is placed into the ureter, from the kidney to the bladder to prevent the ureter from swelling shut.  GENERAL INSTRUCTIONS:  Despite the fact that no skin incisions were used, the area around the ureter and bladder is raw and irritated. The stent is a foreign body which will further irritate the bladder wall. This irritation is manifested by increased frequency of urination, both day and night, and by an increase in the urge to urinate. In some, the urge to urinate is present almost always. Sometimes the urge is strong enough that you may not be able to stop yourself from urinating. The only real cure is to remove the stent and then give time for the bladder wall to heal which can't be done until the danger of the ureter swelling shut has passed, which varies.  You may see some blood in your urine while the stent is in place and a few days afterwards. Do not be alarmed, even if the urine was clear for a while. Get off your feet and drink lots of fluids until clearing occurs. If you start to pass clots or don't improve, call us.  DIET: You may return to your normal diet immediately. Because of the raw surface of your bladder, alcohol, spicy foods, acid type foods and  drinks with caffeine may cause irritation or frequency and should be used in moderation. To keep your urine flowing freely and to avoid constipation, drink plenty of fluids during the day ( 8-10 glasses ). Tip: Avoid cranberry juice because it is very acidic.  ACTIVITY: Your physical activity doesn't need to be restricted. However, if you are very active, you may see some blood in your urine. We suggest that you reduce your activity under these circumstances until the bleeding has stopped.  BOWELS: It is important to keep your bowels regular during the postoperative period. Straining with bowel movements can cause bleeding. A bowel movement every other day is reasonable. Use a mild laxative if needed, such as Milk of Magnesia 2-3 tablespoons, or 2 Dulcolax tablets. Call if you continue to have problems. If you have been taking narcotics for pain, before, during or after your surgery, you may be constipated. Take a laxative if necessary.   MEDICATION: You should resume your pre-surgery medications unless told not to. In addition you will often be given an antibiotic to prevent infection. These should be taken as prescribed until the bottles are finished unless you are having an unusual reaction to one of the drugs.  PROBLEMS YOU SHOULD REPORT TO [place]:  Fevers over 100.5 Fahrenheit.  Heavy bleeding, or clots ( See above notes about blood in urine ).  Inability to urinate.  Drug reactions ( hives, rash,  nausea, vomiting, diarrhea ).  Severe burning or pain with urination that is not improving.  FOLLOW-UP: You will need a follow-up appointment to monitor your progress. Call for this appointment at the number listed above. Usually the first appointment will be about three to fourteen days after your surgery.     Post Anesthesia Home Care Instructions  Activity: Get plenty of rest for the remainder of the day. A responsible adult should stay with you for 24 hours following the procedure.    For the next 24 hours, DO NOT: -Drive a car -Advertising copywriter -Drink alcoholic beverages -Take any medication unless instructed by your physician -Make any legal decisions or sign important papers.  Meals: Start with liquid foods such as gelatin or soup. Progress to regular foods as tolerated. Avoid greasy, spicy, heavy foods. If nausea and/or vomiting occur, drink only clear liquids until the nausea and/or vomiting subsides. Call your physician if vomiting continues.  Special Instructions/Symptoms: Your throat may feel dry or sore from the anesthesia or the breathing tube placed in your throat during surgery. If this causes discomfort, gargle with warm salt water. The discomfort should disappear within 24 hours.  If you had a scopolamine patch placed behind your ear for the management of post- operative nausea and/or vomiting:  1. The medication in the patch is effective for 72 hours, after which it should be removed.  Wrap patch in a tissue and discard in the trash. Wash hands thoroughly with soap and water. 2. You may remove the patch earlier than 72 hours if you experience unpleasant side effects which may include dry mouth, dizziness or visual disturbances. 3. Avoid touching the patch. Wash your hands with soap and water after contact with the patch.

## 2015-11-29 NOTE — Anesthesia Postprocedure Evaluation (Signed)
Anesthesia Post Note  Patient: [name] [name]  Procedure(s) Performed: Procedure(s) (LRB): CYSTOSCOPY/RETROGRADE/DIAGNOSTIC URETEROSCOPY/STONE EXTRACTION (Left) CYSTOSCOPY WITH BIOPSY AND FULGERATION (N/A)  Patient location during evaluation: PACU Anesthesia Type: General Level of consciousness: awake and alert Pain management: pain level controlled Vital Signs Assessment: post-procedure vital signs reviewed and stable Respiratory status: spontaneous breathing, nonlabored ventilation, respiratory function stable and patient connected to nasal cannula oxygen Cardiovascular status: blood pressure returned to baseline and stable Postop Assessment: no signs of nausea or vomiting Anesthetic complications: no    Last Vitals:  Filed Vitals:   11/29/15 1240 11/29/15 1245  BP:  135/67  Pulse: 74 73  Temp:    Resp: 21 16    Last Pain: There were no vitals filed for this visit.               Reino Kent

## 2015-11-30 ENCOUNTER — Encounter (HOSPITAL_COMMUNITY): Payer: Self-pay | Admitting: Emergency Medicine

## 2015-11-30 ENCOUNTER — Emergency Department (HOSPITAL_COMMUNITY)
Admission: EM | Admit: 2015-11-30 | Discharge: 2015-11-30 | Disposition: A | Discharge status: Home / Self Care | Payer: PPO | Attending: Emergency Medicine | Admitting: Emergency Medicine

## 2015-11-30 DIAGNOSIS — E119 Type 2 diabetes mellitus without complications: Secondary | ICD-10-CM | POA: Insufficient documentation

## 2015-11-30 DIAGNOSIS — Z951 Presence of aortocoronary bypass graft: Secondary | ICD-10-CM | POA: Diagnosis not present

## 2015-11-30 DIAGNOSIS — Z880 Allergy status to penicillin: Secondary | ICD-10-CM | POA: Insufficient documentation

## 2015-11-30 DIAGNOSIS — Z7984 Long term (current) use of oral hypoglycemic drugs: Secondary | ICD-10-CM | POA: Diagnosis not present

## 2015-11-30 DIAGNOSIS — R339 Retention of urine, unspecified: Secondary | ICD-10-CM | POA: Diagnosis not present

## 2015-11-30 DIAGNOSIS — I10 Essential (primary) hypertension: Secondary | ICD-10-CM | POA: Insufficient documentation

## 2015-11-30 DIAGNOSIS — R011 Cardiac murmur, unspecified: Secondary | ICD-10-CM | POA: Diagnosis not present

## 2015-11-30 DIAGNOSIS — Z7982 Long term (current) use of aspirin: Secondary | ICD-10-CM | POA: Diagnosis not present

## 2015-11-30 DIAGNOSIS — Z9889 Other specified postprocedural states: Secondary | ICD-10-CM | POA: Insufficient documentation

## 2015-11-30 DIAGNOSIS — K5900 Constipation, unspecified: Secondary | ICD-10-CM | POA: Diagnosis not present

## 2015-11-30 DIAGNOSIS — N401 Enlarged prostate with lower urinary tract symptoms: Secondary | ICD-10-CM | POA: Insufficient documentation

## 2015-11-30 DIAGNOSIS — E785 Hyperlipidemia, unspecified: Secondary | ICD-10-CM | POA: Insufficient documentation

## 2015-11-30 DIAGNOSIS — Z87442 Personal history of urinary calculi: Secondary | ICD-10-CM | POA: Insufficient documentation

## 2015-11-30 DIAGNOSIS — Z87891 Personal history of nicotine dependence: Secondary | ICD-10-CM | POA: Diagnosis not present

## 2015-11-30 DIAGNOSIS — I2510 Atherosclerotic heart disease of native coronary artery without angina pectoris: Secondary | ICD-10-CM | POA: Insufficient documentation

## 2015-11-30 DIAGNOSIS — E039 Hypothyroidism, unspecified: Secondary | ICD-10-CM | POA: Insufficient documentation

## 2015-11-30 DIAGNOSIS — Z79899 Other long term (current) drug therapy: Secondary | ICD-10-CM | POA: Diagnosis not present

## 2015-11-30 LAB — URINALYSIS, ROUTINE W REFLEX MICROSCOPIC
Bilirubin Urine: NEGATIVE
GLUCOSE, UA: NEGATIVE mg/dL
KETONES UR: NEGATIVE mg/dL
Nitrite: NEGATIVE
PROTEIN: 100 mg/dL — AB
SPECIFIC GRAVITY, URINE: 1.016 (ref 1.005–1.030)
pH: 6.5 (ref 5.0–8.0)

## 2015-11-30 LAB — URINE MICROSCOPIC-ADD ON

## 2015-11-30 MED ORDER — LIDOCAINE HCL 2 % EX GEL
1.0000 "application " | Freq: Once | CUTANEOUS | Status: AC
  Administered 2015-11-30: 1 via URETHRAL
  Filled 2015-11-30: qty 11

## 2015-11-30 NOTE — Op Note (Deleted)
NAME:  [name], [name]                ACCOUNT NO.:  [id number]  MEDICAL RECORD NO.:  [id number]  LOCATION:                               FACILITY:  [facility]  PHYSICIAN:  Savalas Monje, MD     DATE OF BIRTH:  04/15/1941  DATE OF PROCEDURE:  11/29/2015 DATE OF DISCHARGE:  11/29/2015                              OPERATIVE REPORT   DIAGNOSIS:  Persistent left distal ureteral stone with refractory colic.  PROCEDURE: 1. Cystoscopy with left retrograde pyelogram interpretation. 2. Left ureteroscopy with basketing of stone. 3. Bladder biopsy with fulguration.  ESTIMATED BLOOD LOSS:  Nil.  COMPLICATION:  None.  SPECIMEN:  Left distal ureteral stone with compositional analysis.  FINDINGS: 1. Left extreme distal ureterovesical junction stone.  This was     amenable to manual manipulation with cystoscope and extraction. 2. No additional residual stones within the left ureter and left     kidney. 3. Successful placement of left ureteral stent, proximal in renal     pelvis and distal in urinary bladder. 4. Area of papillary tissue in the distal right prostatic urethra     somewhat concerning for urothelial carcinoma, this area was     biopsied and fulgurated.  Photodocumentation performed.  SPECIMEN: 1. Left ureteral stone for compositional analysis. 2. Prostatic urethral biopsy for permanent pathology.  INDICATION:  [name] [name] is a pleasant 75-year-old gentleman with history of nephrolithiasis.  He had flank pain several months ago to have a left distal ureteral stone.  He underwent initial shockwave lithotripsy of the stone, but however he failed to pass any fragments.  He has had intermittent pain since then and been on medical therapy.  However at approximately 3-4 months after procedure, he still had non passage of the stone, colicky pain.  Repeat imaging was performed.  This revealed persistent left distal ureteral stone with hydroureteronephrosis. Options were discussed for  management including continued therapy versus repeat shockwave lithotripsy versus ureteroscopy.  Informed consent was obtained and placed in medical record.  PROCEDURE IN DETAIL:  The patient being [name] [name], procedure being left ureteroscopic stone manipulation was confirmed.  Procedure was carried out.  Time-out was performed.  Intravenous antibiotics administered.  General anesthesia introduced.  Patient placed into a low lithotomy position.  Sterile field was created by prepping the patient's penis, perineum, and proximal thighs using iodine x3.  Next, cystourethroscopy was performed using a 23-French rigid cystoscope with 30 degree offset lens.  Inspection of anterior urethra was unremarkable. Inspection of the posterior urethra revealed an approximately 1 cm2 area of some papillary tissue in the right prostatic urethra somewhat concerning for urothelial carcinoma.  The prostate was quite large with trilobar hypertrophy and significant median lobe.  Inspection of the bladder revealed an extreme distal left ureteral stone that was crowning, this is minimal manipulation with the cystoscope.  Cystoscope was dislodged, irrigated and set aside for analysis.  __________ ipsilateral stone free.  The left ureteral orifice was cannulated with a 6-French end-hole catheter and left retrograde pyelogram obtained.  Left retrograde pyelogram demonstrated single left ureter with single system left kidney.  No obvious filling defects or narrowing noted.  A 0.038 zip wire   was advanced at the level of the upper pole, set aside as a safety wire.  Next, semi-rigid ureteroscopy was performed in the distal two-thirds of left ureter alongside a separate Sensor working wire.  No mucosal abnormalities were found.  The semi-rigid scope was exchanged for a 12/14, 38 cm ureteral access sheath at the level of proximal ureter using continuous fluoroscopic guidance and flexible digital ureteroscopy was  performed.  So inspection of the left proximal ureter and systematic inspection of all calyces, left kidney x2, there was no residual stone whatsoever.  The sheath was removed under continuous ureteroscopic vision and there was an area of slight mucosal edema noted in the distal most ureter consistent with likely site of prior stone impaction.  Given his recent hydronephrosis and persistence of stone, it was felt that interval stenting would be warranted.  As such, a 5 x 26 Polaris-type stent was placed over the safety wire using cystoscopic and fluoroscopic guidance.  Good proximal distal deployment were noted.  The tether was left in place.  Attention was then directed __________ of his abnormal tissue in his prostatic urethra.  The area of the right distal prostatic urethra was once again inspected and the area of concern remained.  Photodocumentation was performed.  It was felt that biopsy was clearly warranted, as such, cold cup biopsy forceps were used to biopsy the prostatic urethra in this area.  This was set aside for permanent pathology.  The base was fulgurated with Bugbee electrode, having 1st the irrigation from saline to water __________ resulted in excellent hemostasis of the urethra.  Following these maneuvers, there was excellent hemostasis.  Complete resolution of all stone on the left side as well as the urinary bladder.  The cystoscope was removed.  The tether was fashioned to the dorsum of the penis.  Bladder was partially emptied per cystoscope.  Procedure was terminated.  The patient tolerated procedure well with no immediate periprocedural complications. The patient was taken to Postanesthesia Care Unit in stable condition.          ______________________________ Sebastian Ache, MD     TM/MEDQ  D:  11/29/2015  T:  11/29/2015  Job:  [id number]

## 2015-11-30 NOTE — ED Notes (Signed)
Pt complaint of urinary retention post cystoscopy yesterday.

## 2015-11-30 NOTE — Op Note (Deleted)
NAMEKALUM, MINNER NO.:  [id number]  MEDICAL RECORD NO.:  [id number]  LOCATION:                               FACILITY:  [facility]  PHYSICIAN:  Sebastian Ache, MD     DATE OF BIRTH:  Nov 03, 1940  DATE OF PROCEDURE:  11/29/2015 DATE OF DISCHARGE:  11/29/2015                              OPERATIVE REPORT   DIAGNOSIS:  Persistent left distal ureteral stone with refractory colic.  PROCEDURE: 1. Cystoscopy with left retrograde pyelogram interpretation. 2. Left ureteroscopy with basketing of stone. 3. Bladder biopsy with fulguration.  ESTIMATED BLOOD LOSS:  Nil.  COMPLICATION:  None.  SPECIMEN:  Left distal ureteral stone with compositional analysis.  FINDINGS: 1. Left extreme distal ureterovesical junction stone.  This was     amenable to manual manipulation with cystoscope and extraction. 2. No additional residual stones within the left ureter and left     kidney. 3. Successful placement of left ureteral stent, proximal in renal     pelvis and distal in urinary bladder. 4. Area of papillary tissue in the distal right prostatic urethra     somewhat concerning for urothelial carcinoma, this area was     biopsied and fulgurated.  Photodocumentation performed.  SPECIMEN: 1. Left ureteral stone for compositional analysis. 2. Prostatic urethral biopsy for permanent pathology.  INDICATION:  Mr. Buxton is a pleasant 75 year old gentleman with history of nephrolithiasis.  He had flank pain several months ago to have a left distal ureteral stone.  He underwent initial shockwave lithotripsy of the stone, but however he failed to pass any fragments.  He has had intermittent pain since then and been on medical therapy.  However at approximately 3-4 months after procedure, he still had non passage of the stone, colicky pain.  Repeat imaging was performed.  This revealed persistent left distal ureteral stone with hydroureteronephrosis. Options were discussed for  management including continued therapy versus repeat shockwave lithotripsy versus ureteroscopy.  Informed consent was obtained and placed in medical record.  PROCEDURE IN DETAIL:  The patient being Arnel Wymer, procedure being left ureteroscopic stone manipulation was confirmed.  Procedure was carried out.  Time-out was performed.  Intravenous antibiotics administered.  General anesthesia introduced.  Patient placed into a low lithotomy position.  Sterile field was created by prepping the patient's penis, perineum, and proximal thighs using iodine x3.  Next, cystourethroscopy was performed using a 23-French rigid cystoscope with 30 degree offset lens.  Inspection of anterior urethra was unremarkable. Inspection of the posterior urethra revealed an approximately 1 cm2 area of some papillary tissue in the right prostatic urethra somewhat concerning for urothelial carcinoma.  The prostate was quite large with trilobar hypertrophy and significant median lobe.  Inspection of the bladder revealed an extreme distal left ureteral stone that was crowning, this is minimal manipulation with the cystoscope.  Cystoscope was dislodged, irrigated and set aside for analysis.  __________ ipsilateral stone free.  The left ureteral orifice was cannulated with a 6-French end-hole catheter and left retrograde pyelogram obtained.  Left retrograde pyelogram demonstrated single left ureter with single system left kidney.  No obvious filling defects or narrowing noted.  A 0.038 zip wire  was advanced at the level of the upper pole, set aside as a safety wire.  Next, semi-rigid ureteroscopy was performed in the distal two-thirds of left ureter alongside a separate Sensor working wire.  No mucosal abnormalities were found.  The semi-rigid scope was exchanged for a 12/14, 38 cm ureteral access sheath at the level of proximal ureter using continuous fluoroscopic guidance and flexible digital ureteroscopy was  performed.  So inspection of the left proximal ureter and systematic inspection of all calyces, left kidney x2, there was no residual stone whatsoever.  The sheath was removed under continuous ureteroscopic vision and there was an area of slight mucosal edema noted in the distal most ureter consistent with likely site of prior stone impaction.  Given his recent hydronephrosis and persistence of stone, it was felt that interval stenting would be warranted.  As such, a 5 x 26 Polaris-type stent was placed over the safety wire using cystoscopic and fluoroscopic guidance.  Good proximal distal deployment were noted.  The tether was left in place.  Attention was then directed __________ of his abnormal tissue in his prostatic urethra.  The area of the right distal prostatic urethra was once again inspected and the area of concern remained.  Photodocumentation was performed.  It was felt that biopsy was clearly warranted, as such, cold cup biopsy forceps were used to biopsy the prostatic urethra in this area.  This was set aside for permanent pathology.  The base was fulgurated with Bugbee electrode, having 1st the irrigation from saline to water __________ resulted in excellent hemostasis of the urethra.  Following these maneuvers, there was excellent hemostasis.  Complete resolution of all stone on the left side as well as the urinary bladder.  The cystoscope was removed.  The tether was fashioned to the dorsum of the penis.  Bladder was partially emptied per cystoscope.  Procedure was terminated.  The patient tolerated procedure well with no immediate periprocedural complications. The patient was taken to Postanesthesia Care Unit in stable condition.          ______________________________ Sebastian Ache, MD     TM/MEDQ  D:  11/29/2015  T:  11/29/2015  Job:  [id number]

## 2015-11-30 NOTE — Discharge Instructions (Signed)
Acute Urinary Retention, Male °Acute urinary retention is the temporary inability to urinate. °This is a common problem in older men. As men age their prostates become larger and block the flow of urine from the bladder. This is usually a problem that has come on gradually.  °HOME CARE INSTRUCTIONS °If you are sent home with a Foley catheter and a drainage system, you will need to discuss the best course of action with your health care provider. While the catheter is in, maintain a good intake of fluids. Keep the drainage bag emptied and lower than your catheter. This is so that contaminated urine will not flow back into your bladder, which could lead to a urinary tract infection. °There are two main types of drainage bags. One is a large bag that usually is used at night. It has a good capacity that will allow you to sleep through the night without having to empty it. The second type is called a leg bag. It has a smaller capacity, so it needs to be emptied more frequently. However, the main advantage is that it can be attached by a leg strap and can go underneath your clothing, allowing you the freedom to move about or leave your home. °Only take over-the-counter or prescription medicines for pain, discomfort, or fever as directed by your health care provider.  °SEEK MEDICAL CARE IF: °· You develop a low-grade fever. °· You experience spasms or leakage of urine with the spasms. °SEEK IMMEDIATE MEDICAL CARE IF:  °1. You develop chills or fever. °2. Your catheter stops draining urine. °3. Your catheter falls out. °4. You start to develop increased bleeding that does not respond to rest and increased fluid intake. °MAKE SURE YOU: °1. Understand these instructions. °2. Will watch your condition. °3. Will get help right away if you are not doing well or get worse. °  °This information is not intended to replace advice given to you by your health care provider. Make sure you discuss any questions you have with your  health care provider. °  °Document Released: 01/18/2001 Document Revised: 02/26/2015 Document Reviewed: 03/23/2013 °Elsevier Interactive Patient Education ©2016 [employer] ° ° ° ° °Foley Catheter Care, Adult °A Foley catheter is a soft, flexible tube that is placed into the bladder to drain urine. A Foley catheter may be inserted if: °· You leak urine or are not able to control when you urinate (urinary incontinence). °· You are not able to urinate when you need to (urinary retention). °· You had prostate surgery or surgery on the genitals. °· You have certain medical conditions, such as multiple sclerosis, dementia, or a spinal cord injury. °If you are going home with a Foley catheter in place, follow the instructions below. °TAKING CARE OF THE CATHETER °5. Wash your hands with soap and water. °6. Using mild soap and warm water on a clean washcloth: °¨ Clean the area on your body closest to the catheter insertion site using a circular motion, moving away from the catheter. Never wipe toward the catheter because this could sweep bacteria up into the urethra and cause infection. °¨ Remove all traces of soap. Pat the area dry with a clean towel. For males, reposition the foreskin. °7. Attach the catheter to your leg so there is no tension on the catheter. Use adhesive tape or a leg strap. If you are using adhesive tape, remove any sticky residue left behind by the previous tape you used. °8. Keep the drainage bag below the level of   the bladder, but keep it off the floor. °9. Check throughout the day to be sure the catheter is working and urine is draining freely. Make sure the tubing does not become kinked. °10. Do not pull on the catheter or try to remove it. Pulling could damage internal tissues. °TAKING CARE OF THE DRAINAGE BAGS °You will be given two drainage bags to take home. One is a large overnight drainage bag, and the other is a smaller leg bag that fits underneath clothing. You may wear the overnight bag  at any time, but you should never wear the smaller leg bag at night. Follow the instructions below for how to empty, change, and clean your drainage bags. °Emptying the Drainage Bag °You must empty your drainage bag when it is  -½ full or at least 2-3 times a day. °4. Wash your hands with soap and water. °5. Keep the drainage bag below your hips, below the level of your bladder. This stops urine from going back into the tubing and into your bladder. °6. Hold the dirty bag over the toilet or a clean container. °7. Open the pour spout at the bottom of the bag and empty the urine into the toilet or container. Do not let the pour spout touch the toilet, container, or any other surface. Doing so can place bacteria on the bag, which can cause an infection. °8. Clean the pour spout with a gauze pad or cotton ball that has rubbing alcohol on it. °9. Close the pour spout. °10. Attach the bag to your leg with adhesive tape or a leg strap. °11. Wash your hands well. °Changing the Drainage Bag °Change your drainage bag once a month or sooner if it starts to smell bad or look dirty. Below are steps to follow when changing the drainage bag. °1. Wash your hands with soap and water. °2. Pinch off the rubber catheter so that urine does not spill out. °3. Disconnect the catheter tube from the drainage tube at the connection valve. Do not let the tubes touch any surface. °4. Clean the end of the catheter tube with an alcohol wipe. Use a different alcohol wipe to clean the end of the drainage tube. °5. Connect the catheter tube to the drainage tube of the clean drainage bag. °6. Attach the new bag to the leg with adhesive tape or a leg strap. Avoid attaching the new bag too tightly. °7. Wash your hands well. °Cleaning the Drainage Bag °1. Wash your hands with soap and water. °2. Wash the bag in warm, soapy water. °3. Rinse the bag thoroughly with warm water. °4. Fill the bag with a solution of white vinegar and water (1 cup vinegar to  1 qt warm water [.2 L vinegar to 1 L warm water]). Close the bag and soak it for 30 minutes in the solution. °5. Rinse the bag with warm water. °6. Hang the bag to dry with the pour spout open and hanging downward. °7. Store the clean bag (once it is dry) in a clean plastic bag. °8. Wash your hands well. °PREVENTING INFECTION °· Wash your hands before and after handling your catheter. °· Take showers daily and wash the area where the catheter enters your body. Do not take baths. Replace wet leg straps with dry ones, if this applies. °· Do not use powders, sprays, or lotions on the genital area. Only use creams, lotions, or ointments as directed by your caregiver. °· For females, wipe from front to back after   each bowel movement. °· Drink enough fluids to keep your urine clear or pale yellow unless you have a fluid restriction. °· Do not let the drainage bag or tubing touch or lie on the floor. °· Wear cotton underwear to absorb moisture and to keep your skin drier. °SEEK MEDICAL CARE IF:  °· Your urine is cloudy or smells unusually bad. °· Your catheter becomes clogged. °· You are not draining urine into the bag or your bladder feels full. °· Your catheter starts to leak. °SEEK IMMEDIATE MEDICAL CARE IF:  °· You have pain, swelling, redness, or pus where the catheter enters the body. °· You have pain in the abdomen, legs, lower back, or bladder. °· You have a fever. °· You see blood fill the catheter, or your urine is pink or red. °· You have nausea, vomiting, or chills. °· Your catheter gets pulled out. °MAKE SURE YOU:  °· Understand these instructions. °· Will watch your condition. °· Will get help right away if you are not doing well or get worse. °  °This information is not intended to replace advice given to you by your health care provider. Make sure you discuss any questions you have with your health care provider. °  °Document Released: 10/12/2005 Document Revised: 02/26/2014 Document Reviewed:  10/03/2012 °Elsevier Interactive Patient Education ©2016 [employer] ° °

## 2015-11-30 NOTE — Op Note (Signed)
NAMEJHAYDEN, [name] [name] NO.:  [id number]  MEDICAL RECORD NO.:  [id number]  LOCATION:                               FACILITY:  [facility]  PHYSICIAN:  [name] Ache, MD     DATE OF BIRTH:  08-15-1941  DATE OF PROCEDURE:  11/29/2015                              OPERATIVE REPORT  DIAGNOSIS:  Persistent left distal ureteral stone with refractory colic.  PROCEDURE: 1. Cystoscopy with left retrograde pyelogram and interpretation. 2. Left ureteroscopy with basketing of stone. 3. Bladder biopsy with fulguration.  ESTIMATED BLOOD LOSS:  Nil.  COMPLICATION:  None.  SPECIMEN:  Left distal ureteral stone with compositional analysis.  FINDINGS: 1. Left extreme distal ureterovesical junction stone.  This was     amenable to manual manipulation with cystoscope and extraction. 2. No additional residual stones within the left ureter and left     kidney. 3. Successful placement of left ureteral stent, proximal in renal     pelvis and distal in urinary bladder. 4. Area of papillary tissue in the distal right prostatic urethra     somewhat concerning for urothelial carcinoma, this area was     biopsied and fulgurated.  Photodocumentation performed.  SPECIMEN: 1. Left ureteral stone for compositional analysis. 2. Prostatic urethral biopsy for permanent pathology.  INDICATION:  [name] [name] is a pleasant 75 year old gentleman with history of nephrolithiasis.  He had flank pain several months ago to have a left distal ureteral stone.  He underwent initial shockwave lithotripsy of the stone, but however he failed to pass any fragments.  He has had intermittent pain since then and been on medical therapy.  However at approximately 3-4 months after procedure, he still had non passage of the stone, colicky pain.  Repeat imaging was performed.  This revealed persistent left distal ureteral stone with hydroureteronephrosis. Options were discussed for management including continued therapy  versus repeat shockwave lithotripsy versus ureteroscopy.  Informed consent was obtained and placed in medical record.  PROCEDURE IN DETAIL:  The patient being [name] [name], procedure being left ureteroscopic stone manipulation was confirmed.  Procedure was carried out.  Time-out was performed.  Intravenous antibiotics administered.  General anesthesia introduced.  Patient placed into a low lithotomy position.  Sterile field was created by prepping the patient's penis, perineum, and proximal thighs using iodine x3.  Next, cystourethroscopy was performed using a 23-French rigid cystoscope with 30 degree offset lens.  Inspection of anterior urethra was unremarkable. Inspection of the posterior urethra revealed an approximately 1 cm2 area of some papillary tissue in the right prostatic urethra somewhat concerning for urothelial carcinoma.  The prostate was quite large with trilobar hypertrophy and significant median lobe.  Inspection of the bladder revealed an extreme distal left ureteral stone that was crowning, this is minimal manipulation with the cystoscope.  Cystoscope was dislodged, irrigated and set aside for analysis.  As the goal of today is ipsilateral stone free, the left ureteral orifice was cannulated with a 6-French end-hole catheter and left retrograde pyelogram obtained.  Left retrograde pyelogram demonstrated single left ureter with single system left kidney.  No obvious filling defects or narrowing noted.  A 0.038 zip wire was  advanced at the level of the upper pole, set aside as a safety wire.  Next, semi-rigid ureteroscopy was performed in the distal two-thirds of left ureter alongside a separate Sensor working wire.  No mucosal abnormalities were found.  The semi-rigid scope was exchanged for a 12/14, 38 cm ureteral access sheath at the level of proximal ureter using continuous fluoroscopic guidance and flexible digital ureteroscopy was performed.  So inspection of the  left proximal ureter and systematic inspection of all calyces, left kidney x2, there was no residual stone whatsoever.  The sheath was removed under continuous ureteroscopic vision and there was an area of slight mucosal edema noted in the distal most ureter consistent with likely site of prior stone impaction.  Given his recent hydronephrosis and persistence of stone, it was felt that interval stenting would be warranted.  As such, a 5 x 26 Polaris-type stent was placed over the safety wire using cystoscopic and fluoroscopic guidance.  Good proximal distal deployment were noted.  The tether was left in place.  Attention was then directed At biopsy of his abnormal tissue in his prostatic urethra.  The area of the right distal prostatic urethra was once again inspected and the area of concern remained.  Photodocumentation was performed.  It was felt that biopsy was clearly warranted, as such, cold cup biopsy forceps were used to biopsy the prostatic urethra in this area.  This was set aside for permanent pathology.  The base was fulgurated with Bugbee electrode, having 1st the irrigation from saline to water which resulted in excellent hemostasis of the urethra.  Following these maneuvers, there was excellent hemostasis.  Complete resolution of all stone on the left side as well as the urinary bladder.  The cystoscope was removed.  The tether was fashioned to the dorsum of the penis.  Bladder was partially emptied per cystoscope.  Procedure was terminated.  The patient tolerated procedure well with no immediate periprocedural complications. The patient was taken to Postanesthesia Care Unit in stable condition.          ______________________________ [name] Ache, MD     TM/MEDQ  D:  11/29/2015  T:  11/29/2015  Job:  [id number]

## 2015-11-30 NOTE — ED Notes (Signed)
Leg bag instructions given to pt and wife. Both verbalize understanding

## 2015-11-30 NOTE — ED Provider Notes (Signed)
CSN: [id number]     Arrival date & time 11/30/15  1504 History   First MD Initiated Contact with Patient 11/30/15 1512     Chief Complaint  Patient presents with  . Urinary Retention      The history is provided by the patient.   patient presents not minimally urinate. Had cystoscopy with stone extraction yesterday. States he has had only dribbling urine since last night. States he has pain in his abdomen. States she's not had bowel movement either. No fevers. States he is uncomfortable. Reviewing records show that he has a history of bph.   Past Medical History  Diagnosis Date  . Hypertension   . Hyperlipidemia   . Hypothyroidism   . Type 2 diabetes mellitus (HCC)   . Hydronephrosis, left   . Left ureteral stone   . History of kidney stones   . Nephrolithiasis     right side per ct  non-obstructive  . Sigmoid diverticulosis   . Hiatal hernia   . Sinus bradycardia   . RBBB (right bundle branch block)   . Benign prostatic hypertrophy with lower urinary tract symptoms (LUTS)   . S/P CABG x 6     1998  . CAD (coronary artery disease)     cardiologist-  dr Verdis Prime--  s/p  cabg x6 in 1998  . Heart murmur   . Bilateral edema of lower extremity     IMPROVED W/ TED HOSE   Past Surgical History  Procedure Laterality Date  . Left heart catheterization with coronary/graft angiogram  06/16/2013    Procedure: LEFT HEART CATHETERIZATION WITH Isabel Caprice;  Surgeon: Lesleigh Noe, MD;  Location: [facility] CATH LAB;  Service: Cardiovascular; Native vessel occlusive disease w/ ostial total occlusion RCA, dLM, & heavy calicification LM LAD CX RCA/  SVG to diagonal patent, SVG to marginal 1 patent & 2 occluded, LIMA to dLAD patent/ dCFX collateral from obtuse marginal graft/  normal LVF  . Coronary artery bypass graft  1998    CABG X 6 --   LIMA  to the LAD and  other undefined SVG's  . Transthoracic echocardiogram  05-23-2015  dr Sherilyn Cooter smith    mild LVH,  grade 1 diastolic  dysfunction,  ef 55-060%,  mild AV calcifacation without stenosis,  mild AR and MR,  moderate LAE,  trivial PR and TR,  mild increase pulmonary pressure  . Extracorporeal shock wave lithotripsy Left 08-12-2015  . Cataract extraction w/ intraocular lens  implant, bilateral  2011   Family History  Problem Relation Age of Onset  . Tuberculosis Mother   . CAD Father    Social History  Substance Use Topics  . Smoking status: Former Smoker -- 10 years    Types: Cigarettes    Quit date: 10/26/1966  . Smokeless tobacco: Never Used  . Alcohol Use: No    Review of Systems  Constitutional: Negative for appetite change.  Respiratory: Negative for shortness of breath.   Gastrointestinal: Positive for abdominal pain and constipation.  Genitourinary: Negative for dysuria, urgency, decreased urine volume, penile swelling and testicular pain.  Musculoskeletal: Negative for back pain.  Skin: Negative for wound.  Neurological: Negative for seizures.      Allergies  Penicillins  Home Medications   Prior to Admission medications   Medication Sig Start Date End Date Taking? Authorizing Provider  amLODipine (NORVASC) 5 MG tablet TAKE 1 TABLET BY MOUTH EVERY DAY Patient taking differently: TAKE 5 MG  BY MOUTH EVERY  MORNING   Yes Lyn Records, MD  aspirin EC 325 MG tablet Take 325 mg by mouth daily.   Yes Historical Provider, MD  atorvastatin (LIPITOR) 80 MG tablet Take 40 mg by mouth every morning.    Yes Historical Provider, MD  finasteride (PROSCAR) 5 MG tablet Take 5 mg by mouth every morning.    Yes Historical Provider, MD  Flaxseed, Linseed, (FLAX SEEDS PO) Take 1 capsule by mouth daily.   Yes Historical Provider, MD  hydrochlorothiazide (HYDRODIURIL) 25 MG tablet Take 25 mg by mouth every morning.    Yes Historical Provider, MD  levothyroxine (SYNTHROID, LEVOTHROID) 88 MCG tablet Take 88 mcg by mouth daily before breakfast.   Yes Historical Provider, MD  lisinopril (PRINIVIL,ZESTRIL) 40  MG tablet Take 40 mg by mouth every morning.    Yes Historical Provider, MD  metFORMIN (GLUCOPHAGE) 500 MG tablet Take 500 mg by mouth 2 (two) times daily with a meal.   Yes Historical Provider, MD  metoprolol succinate (TOPROL-XL) 50 MG 24 hr tablet Take 50 mg by mouth every morning. Take with or immediately following a meal.   Yes Historical Provider, MD  nitroGLYCERIN (NITROSTAT) 0.4 MG SL tablet Place 1 tablet (0.4 mg total) under the tongue every 5 (five) minutes as needed for chest pain. 05/21/14  Yes Lyn Records, MD  oxyCODONE-acetaminophen (ROXICET) 5-325 MG tablet Take 1-2 tablets by mouth every 6 (six) hours as needed for moderate pain or severe pain. Post-operatively 11/29/15  Yes Sebastian Ache, MD  potassium chloride SA (K-DUR,KLOR-CON) 20 MEQ tablet Take 20 mEq by mouth every morning.    Yes Historical Provider, MD  senna-docusate (SENOKOT-S) 8.6-50 MG tablet Take 1 tablet by mouth 2 (two) times daily. While taking pain meds to prevent constipation 11/29/15  Yes Sebastian Ache, MD  sulfamethoxazole-trimethoprim (BACTRIM DS,SEPTRA DS) 800-160 MG tablet Take 1 tablet by mouth 2 (two) times daily. X 3 days to prevent infection with stent in place. 11/29/15  Yes Sebastian Ache, MD   BP 159/94 mmHg  Pulse 78  Temp(Src) 97.7 F (36.5 C) (Oral)  Resp 18  SpO2 99% Physical Exam  Constitutional: He appears well-developed.  HENT:  Head: Atraumatic.  Neck: Neck supple.  Cardiovascular: Normal rate.   Pulmonary/Chest: Effort normal.  Abdominal: He exhibits distension and mass.  Genitourinary:  Penis has 2 black strings coming from it.   Musculoskeletal: Normal range of motion.  Skin: Skin is warm.    ED Course  Procedures (including critical care time) Labs Review Labs Reviewed  URINALYSIS, ROUTINE W REFLEX MICROSCOPIC (NOT AT [facility]) - Abnormal; Notable for the following:    Color, Urine AMBER (*)    APPearance CLOUDY (*)    Hgb urine dipstick LARGE (*)    Protein, ur 100 (*)     Leukocytes, UA SMALL (*)    All other components within normal limits  URINE MICROSCOPIC-ADD ON - Abnormal; Notable for the following:    Squamous Epithelial / LPF 0-5 (*)    Bacteria, UA RARE (*)    All other components within normal limits    Imaging Review No results found. I have personally reviewed and evaluated these images and lab results as part of my medical decision-making.   EKG Interpretation None      MDM   Final diagnoses:  Urinary retention    Patient with urinary retention after cystoscopy and stent placement yesterday. Greater than 1 L of urine by bladder scan. Foley catheter placed with relief.  Patient is on antibiotics. Patient feels much better on be discharged home.    Benjiman Core, MD 11/30/15 [phone]

## 2015-12-02 ENCOUNTER — Encounter (HOSPITAL_BASED_OUTPATIENT_CLINIC_OR_DEPARTMENT_OTHER): Payer: Self-pay | Admitting: Urology

## 2015-12-05 DIAGNOSIS — N200 Calculus of kidney: Secondary | ICD-10-CM | POA: Diagnosis not present

## 2015-12-05 DIAGNOSIS — N1330 Unspecified hydronephrosis: Secondary | ICD-10-CM | POA: Diagnosis not present

## 2015-12-05 DIAGNOSIS — R3912 Poor urinary stream: Secondary | ICD-10-CM | POA: Diagnosis not present

## 2015-12-12 DIAGNOSIS — R3912 Poor urinary stream: Secondary | ICD-10-CM | POA: Diagnosis not present

## 2015-12-12 DIAGNOSIS — N401 Enlarged prostate with lower urinary tract symptoms: Secondary | ICD-10-CM | POA: Diagnosis not present

## 2015-12-19 ENCOUNTER — Encounter: Payer: Self-pay | Admitting: Family

## 2015-12-27 ENCOUNTER — Encounter: Payer: Self-pay | Admitting: Family

## 2015-12-27 ENCOUNTER — Ambulatory Visit (INDEPENDENT_AMBULATORY_CARE_PROVIDER_SITE_OTHER): Payer: PPO | Admitting: Family

## 2015-12-27 ENCOUNTER — Ambulatory Visit (HOSPITAL_COMMUNITY)
Admission: RE | Admit: 2015-12-27 | Discharge: 2015-12-27 | Disposition: A | Discharge status: Home / Self Care | Payer: PPO | Source: Ambulatory Visit | Attending: Family | Admitting: Family

## 2015-12-27 VITALS — BP 124/76 | HR 53 | Ht 65.0 in | Wt 192.0 lb

## 2015-12-27 DIAGNOSIS — E1151 Type 2 diabetes mellitus with diabetic peripheral angiopathy without gangrene: Secondary | ICD-10-CM

## 2015-12-27 DIAGNOSIS — E785 Hyperlipidemia, unspecified: Secondary | ICD-10-CM | POA: Insufficient documentation

## 2015-12-27 DIAGNOSIS — I872 Venous insufficiency (chronic) (peripheral): Secondary | ICD-10-CM

## 2015-12-27 DIAGNOSIS — E119 Type 2 diabetes mellitus without complications: Secondary | ICD-10-CM | POA: Insufficient documentation

## 2015-12-27 DIAGNOSIS — I10 Essential (primary) hypertension: Secondary | ICD-10-CM | POA: Insufficient documentation

## 2015-12-27 DIAGNOSIS — I739 Peripheral vascular disease, unspecified: Secondary | ICD-10-CM | POA: Diagnosis not present

## 2015-12-27 NOTE — Progress Notes (Signed)
[facility] [place] HISTORY AND PHYSICAL -PAD  History of Present Illness [name] LairHoward F [name] is a 75 y.o. male patient of Dr. Imogene Burnhen who presents  For follow up of mild chronic venous insufficiency and mild PAD.  Pt is a known diabetic that was found on routine examination by his PCP to have decreased pulses in his L leg. The patient denies any intermittent claudication or rest pain though he notes his ambulation is limited. His primary complaint was right leg swelling with extended standing or ambulation. The patient had bilateral GSV harvested for his 6 vessel CABG in 2014.  The patient has had no history of DVT, known history of varicose vein, no history of venous stasis ulcers, no history oflymphedema and known history of skin changes in lower legs. There is no family history of venous disorders. The patient has not used compression stockings in the past. Dr. Imogene Burnhen advised compressive therapy for pt's chromic venous insufficiency and pt has been wearing knee high graduated compression hose with excellent results.  He and his wife exercise at a gym 5 days/week, he also stays active at his home.  The patient denies New Medical or Surgical History.  Pt Diabetic: Yes, on metformin only Pt smoker: former smoker, quit in 1964  Pt meds include: Statin :Yes Betablocker: Yes ASA: Yes Other anticoagulants/antiplatelets: no  Past Medical History  Diagnosis Date  . Hypertension   . Hyperlipidemia   . Hypothyroidism   . Type 2 diabetes mellitus (HCC)   . Hydronephrosis, left   . Left ureteral stone   . History of kidney stones   . Nephrolithiasis     right side per ct  non-obstructive  . Sigmoid diverticulosis   . Hiatal hernia   . Sinus bradycardia   . RBBB (right bundle branch block)   . Benign prostatic hypertrophy with lower urinary tract symptoms (LUTS)   . S/P CABG x 6     1998  . CAD (coronary artery disease)     cardiologist-  dr Verdis Primehenry smith--  s/p  cabg  x6 in 1998  . Heart murmur   . Bilateral edema of lower extremity     IMPROVED W/ TED HOSE    Social History Social History  Substance Use Topics  . Smoking status: Former Smoker -- 10 years    Types: Cigarettes    Quit date: 10/26/1966  . Smokeless tobacco: Never Used  . Alcohol Use: No    Family History Family History  Problem Relation Age of Onset  . Tuberculosis Mother   . CAD Father     Past Surgical History  Procedure Laterality Date  . Left heart catheterization with coronary/graft angiogram  06/16/2013    Procedure: LEFT HEART CATHETERIZATION WITH Isabel CapriceORONARY/GRAFT ANGIOGRAM;  Surgeon: Lesleigh NoeHenry W Smith III, MD;  Location[facility];  Service: Cardiovascular; Native vessel occlusive disease w/ ostial total occlusion RCA, dLM, & heavy calicification LM LAD CX RCA/  SVG to diagonal patent, SVG to marginal 1 patent & 2 occluded, LIMA to dLAD patent/ dCFX collateral from obtuse marginal graft/  normal LVF  . Coronary artery bypass graft  1998    CABG X 6 --   LIMA  to the LAD and  other undefined SVG's  . Transthoracic echocardiogram  05-23-2015  dr Sherilyn Cooterhenry smith    mild LVH,  grade 1 diastolic dysfunction,  ef 55-060%,  mild AV calcifacation without stenosis,  mild AR and MR,  moderate LAE,  trivial PR and  TR,  mild increase pulmonary pressure  . Extracorporeal shock wave lithotripsy Left 08-12-2015  . Cataract extraction w/ intraocular lens  implant, bilateral  2011  . Cystoscopy/retrograde/ureteroscopy/stone extraction with basket Left 11/29/2015    Procedure: CYSTOSCOPY/RETROGRADE/DIAGNOSTIC URETEROSCOPY/STONE EXTRACTION;  Surgeon: Sebastian Ache, MD;  Location: [facility];  Service: Urology;  Laterality: Left;  . Cystoscopy with biopsy N/A 11/29/2015    Procedure: CYSTOSCOPY WITH BIOPSY AND FULGERATION;  Surgeon: Sebastian Ache, MD;  Location: [facility];  Service: Urology;  Laterality: N/A;    Allergies  Allergen Reactions  . Penicillins Rash     Has patient had a PCN reaction causing immediate rash, facial/tongue/throat swelling, SOB or lightheadedness with hypotension:no Has patient had a PCN reaction causing severe rash involving mucus membranes or skin necrosis: no Has patient had a PCN reaction that required hospitalization no Has patient had a PCN reaction occurring within the last 10 years: no If all of the above answers are "NO", then may proceed with Cephalosporin use.     Current Outpatient Prescriptions  Medication Sig Dispense Refill  . amLODipine (NORVASC) 5 MG tablet TAKE 1 TABLET BY MOUTH EVERY DAY (Patient taking differently: TAKE 5 MG  BY MOUTH EVERY MORNING) 90 tablet 0  . aspirin EC 325 MG tablet Take 325 mg by mouth daily.    Marland Kitchen atorvastatin (LIPITOR) 80 MG tablet Take 40 mg by mouth every morning.     . finasteride (PROSCAR) 5 MG tablet Take 5 mg by mouth every morning.     . Flaxseed, Linseed, (FLAX SEEDS PO) Take 1 capsule by mouth daily.    . hydrochlorothiazide (HYDRODIURIL) 25 MG tablet Take 25 mg by mouth every morning.     Marland Kitchen levothyroxine (SYNTHROID, LEVOTHROID) 88 MCG tablet Take 88 mcg by mouth daily before breakfast.    . lisinopril (PRINIVIL,ZESTRIL) 40 MG tablet Take 40 mg by mouth every morning.     . metFORMIN (GLUCOPHAGE) 500 MG tablet Take 500 mg by mouth 2 (two) times daily with a meal.    . metoprolol succinate (TOPROL-XL) 50 MG 24 hr tablet Take 50 mg by mouth every morning. Take with or immediately following a meal.    . nitroGLYCERIN (NITROSTAT) 0.4 MG SL tablet Place 1 tablet (0.4 mg total) under the tongue every 5 (five) minutes as needed for chest pain. 25 tablet 3  . oxyCODONE-acetaminophen (ROXICET) 5-325 MG tablet Take 1-2 tablets by mouth every 6 (six) hours as needed for moderate pain or severe pain. Post-operatively 20 tablet 0  . potassium chloride SA (K-DUR,KLOR-CON) 20 MEQ tablet Take 20 mEq by mouth every morning.     . senna-docusate (SENOKOT-S) 8.6-50 MG tablet Take 1 tablet by  mouth 2 (two) times daily. While taking pain meds to prevent constipation 30 tablet 0  . sulfamethoxazole-trimethoprim (BACTRIM DS,SEPTRA DS) 800-160 MG tablet Take 1 tablet by mouth 2 (two) times daily. X 3 days to prevent infection with stent in place. 6 tablet 0   No current facility-administered medications for this visit.    ROS: See HPI for pertinent positives and negatives.   Physical Examination  Filed Vitals:   12/27/15 0937  BP: 124/76  Pulse: 53  Height:  (1.651 m)  Weight: 192 lb (87.091 kg)  SpO2: 97%   Body mass index is 31.95 kg/(m^2).  General: A&O x 3, WDWN, obese male  Pulmonary: Sym exp, good air movt, CTAB, no rales, rhonchi, or wheezing  Cardiac: RRR, Nl S1, S2, no  detected murmur  Vascular: Vessel Right Left  Radial Palpable Palpable  Brachial Palpable Palpable  Carotid Palpable, without bruit Palpable, without bruit  Aorta Not palpable N/A  Femoral Palpable Palpable  Popliteal Not palpable Not palpable  PT  not Palpable Faintly Palpable  DP Faintly Palpable Faintly Palpable   Gastrointestinal: soft, NTND, -G/R, - HSM, - palpable masses, - CVAT B, unable to palpate aorta due to pannus  Musculoskeletal: M/S 5/5 throughout , Extremities without ischemic changes,  B healed GSV harvest incisions, R LDS, L spider and varicose veins. Trace pitting edema in right ankle, no edema in left ankle  Neurologic: Pain and light touch intact in extremities , Motor exam as listed above    Non-Invasive Vascular Imaging  BLE Venous Insufficiency Duplex (Date: 12/21/2014):   RLE: No DVT and SVT, No GSV: harvested, +deep venous reflux: PTV, incompetent calf perforator  LLE: No DVT and SVT, No GSV: harvested, +deep venous reflux: SFJ, incompetent calf perforator          Non-Invasive Vascular Imaging: DATE: 12/27/2015 ABI   R: [place] ([place], 10/12/14), DP: triphasic, PT: triphasic, TBI: 0.78 (0.74)  L:  [place] (), DP: triphasic, PT: triphasic, TBI: 0.78 (0.78)   ASSESSMENT: [name] [name] is a 75 y.o. male who presents with: B leg chronic venous insufficiency (C4), non-hemodynamically significant calcific peripheral arterial disease c/w diabetic complications.  ABI's were not obtained due to known non compressible ankle pressures which indicates medial calcification.  Normal bilateral tibial artery Doppler waveforms and toe brachial indices.  He no longer has swelling in either lower leg since he has been wearing his knee high graduated compression hose during the day. He is more active, has no claudication sx's, exercises regularly, has no signs of ischemia in his feet/legs, has no chronic venous stasis dermatitis.     PLAN:  Based on the patient's vascular studies and examination, pt will return to clinic in 1 year with ABI's.   I discussed in depth with the patient the nature of atherosclerosis, and emphasized the importance of maximal medical management including strict control of blood pressure, blood glucose, and lipid levels, obtaining regular exercise, and continued cessation of smoking.  The patient is aware that without maximal medical management the underlying atherosclerotic disease process will progress, limiting the benefit of any interventions.  The patient was given information about PAD including signs, symptoms, treatment, what symptoms should prompt the patient to seek immediate medical care, and risk reduction measures to take.  Charisse March, RN, MSN, FNP-C [facility] [place] Phone: [phone]  Clinic MD: Imogene Burn  12/27/2015 9:43 AM

## 2015-12-27 NOTE — Patient Instructions (Signed)

## 2015-12-30 ENCOUNTER — Other Ambulatory Visit: Payer: Self-pay | Admitting: *Deleted

## 2015-12-30 DIAGNOSIS — I739 Peripheral vascular disease, unspecified: Secondary | ICD-10-CM

## 2016-01-13 DIAGNOSIS — H6123 Impacted cerumen, bilateral: Secondary | ICD-10-CM | POA: Diagnosis not present

## 2016-01-27 DIAGNOSIS — N401 Enlarged prostate with lower urinary tract symptoms: Secondary | ICD-10-CM | POA: Diagnosis not present

## 2016-01-27 DIAGNOSIS — R3912 Poor urinary stream: Secondary | ICD-10-CM | POA: Diagnosis not present

## 2016-01-27 DIAGNOSIS — Z0000 Encounter for general adult medical examination without abnormal findings: Secondary | ICD-10-CM | POA: Diagnosis not present

## 2016-01-27 DIAGNOSIS — N200 Calculus of kidney: Secondary | ICD-10-CM | POA: Diagnosis not present

## 2016-02-28 DIAGNOSIS — Z0000 Encounter for general adult medical examination without abnormal findings: Secondary | ICD-10-CM | POA: Diagnosis not present

## 2016-02-28 DIAGNOSIS — I10 Essential (primary) hypertension: Secondary | ICD-10-CM | POA: Diagnosis not present

## 2016-02-28 DIAGNOSIS — E7800 Pure hypercholesterolemia, unspecified: Secondary | ICD-10-CM | POA: Diagnosis not present

## 2016-02-28 DIAGNOSIS — I2510 Atherosclerotic heart disease of native coronary artery without angina pectoris: Secondary | ICD-10-CM | POA: Diagnosis not present

## 2016-02-28 DIAGNOSIS — Z1389 Encounter for screening for other disorder: Secondary | ICD-10-CM | POA: Diagnosis not present

## 2016-02-28 DIAGNOSIS — E039 Hypothyroidism, unspecified: Secondary | ICD-10-CM | POA: Diagnosis not present

## 2016-02-28 DIAGNOSIS — E1151 Type 2 diabetes mellitus with diabetic peripheral angiopathy without gangrene: Secondary | ICD-10-CM | POA: Diagnosis not present

## 2016-02-28 DIAGNOSIS — I739 Peripheral vascular disease, unspecified: Secondary | ICD-10-CM | POA: Diagnosis not present

## 2016-02-28 DIAGNOSIS — I359 Nonrheumatic aortic valve disorder, unspecified: Secondary | ICD-10-CM | POA: Diagnosis not present

## 2016-03-04 ENCOUNTER — Other Ambulatory Visit: Payer: Self-pay | Admitting: *Deleted

## 2016-03-04 NOTE — Telephone Encounter (Signed)
error 

## 2016-03-09 ENCOUNTER — Telehealth: Payer: Self-pay | Admitting: Interventional Cardiology

## 2016-03-09 DIAGNOSIS — I2510 Atherosclerotic heart disease of native coronary artery without angina pectoris: Secondary | ICD-10-CM

## 2016-03-09 MED ORDER — NITROGLYCERIN 0.4 MG SL SUBL: 0.4000 mg | SUBLINGUAL_TABLET | SUBLINGUAL | Status: DC | PRN

## 2016-03-09 NOTE — Telephone Encounter (Signed)
New message        *STAT* If patient is at the pharmacy, call can be transferred to refill team.   1. Which medications need to be refilled? (please list name of each medication and dose if known) nitro 2. Which pharmacy/location (including street and city if local pharmacy) is medication to be sent to? [facility] on [address] 3. Do they need a 30 day or 90 day supply? 30

## 2016-04-21 DIAGNOSIS — H04121 Dry eye syndrome of right lacrimal gland: Secondary | ICD-10-CM | POA: Diagnosis not present

## 2016-04-21 DIAGNOSIS — H18413 Arcus senilis, bilateral: Secondary | ICD-10-CM | POA: Diagnosis not present

## 2016-04-21 DIAGNOSIS — Z961 Presence of intraocular lens: Secondary | ICD-10-CM | POA: Diagnosis not present

## 2016-04-21 DIAGNOSIS — H04122 Dry eye syndrome of left lacrimal gland: Secondary | ICD-10-CM | POA: Diagnosis not present

## 2016-05-27 ENCOUNTER — Encounter: Payer: Self-pay | Admitting: Interventional Cardiology

## 2016-06-12 ENCOUNTER — Encounter: Payer: Self-pay | Admitting: Interventional Cardiology

## 2016-06-12 ENCOUNTER — Ambulatory Visit (INDEPENDENT_AMBULATORY_CARE_PROVIDER_SITE_OTHER): Payer: PPO | Admitting: Interventional Cardiology

## 2016-06-12 VITALS — BP 142/80 | HR 53 | Ht 65.0 in | Wt 205.4 lb

## 2016-06-12 DIAGNOSIS — I208 Other forms of angina pectoris: Secondary | ICD-10-CM | POA: Diagnosis not present

## 2016-06-12 DIAGNOSIS — I4510 Unspecified right bundle-branch block: Secondary | ICD-10-CM | POA: Diagnosis not present

## 2016-06-12 DIAGNOSIS — I25812 Atherosclerosis of bypass graft of coronary artery of transplanted heart without angina pectoris: Secondary | ICD-10-CM

## 2016-06-12 DIAGNOSIS — I10 Essential (primary) hypertension: Secondary | ICD-10-CM | POA: Diagnosis not present

## 2016-06-12 DIAGNOSIS — I739 Peripheral vascular disease, unspecified: Secondary | ICD-10-CM

## 2016-06-12 NOTE — Progress Notes (Signed)
Cardiology Office Note    Date:  06/12/2016   ID:  [name] [name], DOB Oct 28, 1940, MRN [id number]  PCP:  Katy Apo, MD  Cardiologist: Lesleigh Noe, MD   Chief Complaint  Patient presents with  . Coronary Artery Disease    History of Present Illness:  [name] [name] is a 75 y.o. male for follow-up of CAD, history of coronary artery bypass grafting, type 2 diabetes, peripheral arterial disease, essential hypertension, and diabetes mellitus  [name] [name] is doing well. He exercises. He has not lost weight. He has stable angina. He rarely uses nitroglycerin. He is able to mow his own grass. He denies orthopnea, PND, lower extremity swelling, dyspnea on exertion. He has not had syncope.    Past Medical History:  Diagnosis Date  . Benign prostatic hypertrophy with lower urinary tract symptoms (LUTS)   . Bilateral edema of lower extremity    IMPROVED W/ TED HOSE  . CAD (coronary artery disease)    cardiologist-  dr Verdis Prime--  s/p  cabg x6 in 1998  . Heart murmur   . Hiatal hernia   . History of kidney stones   . Hydronephrosis, left   . Hyperlipidemia   . Hypertension   . Hypothyroidism   . Left ureteral stone   . Nephrolithiasis    right side per ct  non-obstructive  . RBBB (right bundle branch block)   . 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 Sigmoid diverticulosis   . Sinus bradycardia   . Type 2 diabetes mellitus (HCC)     Past Surgical History:  Procedure Laterality Date  . CATARACT EXTRACTION W/ INTRAOCULAR LENS  IMPLANT, BILATERAL  2011  . CORONARY ARTERY BYPASS GRAFT  1998   CABG X 6 --   LIMA  to the LAD and  other undefined SVG's  . CYSTOSCOPY WITH BIOPSY N/A 11/29/2015   Procedure: CYSTOSCOPY WITH BIOPSY AND FULGERATION;  Surgeon: Sebastian Ache, MD;  Location: [facility];  Service: Urology;  Laterality: N/A;  . CYSTOSCOPY/RETROGRADE/URETEROSCOPY/STONE EXTRACTION WITH BASKET Left 11/29/2015   Procedure: CYSTOSCOPY/RETROGRADE/DIAGNOSTIC  URETEROSCOPY/STONE EXTRACTION;  Surgeon: Sebastian Ache, MD;  Location: [facility];  Service: Urology;  Laterality: Left;  . EXTRACORPOREAL SHOCK WAVE LITHOTRIPSY Left 08-12-2015  . LEFT HEART CATHETERIZATION WITH CORONARY/GRAFT ANGIOGRAM  06/16/2013   Procedure: LEFT HEART CATHETERIZATION WITH Isabel Caprice;  Surgeon: Lesleigh Noe, MD;  Location: [facility] CATH LAB;  Service: Cardiovascular; Native vessel occlusive disease w/ ostial total occlusion RCA, dLM, & heavy calicification LM LAD CX RCA/  SVG to diagonal patent, SVG to marginal 1 patent & 2 occluded, LIMA to dLAD patent/ dCFX collateral from obtuse marginal graft/  normal LVF  . TRANSTHORACIC ECHOCARDIOGRAM  05-23-2015  dr Sherilyn Cooter Keilee Denman   mild LVH,  grade 1 diastolic dysfunction,  ef 55-060%,  mild AV calcifacation without stenosis,  mild AR and MR,  moderate LAE,  trivial PR and TR,  mild increase pulmonary pressure    Current Medications: Outpatient Medications Prior to Visit  Medication Sig Dispense Refill  . amLODipine (NORVASC) 5 MG tablet TAKE 1 TABLET BY MOUTH EVERY DAY (Patient taking differently: TAKE 5 MG  BY MOUTH EVERY MORNING) 90 tablet 0  . aspirin EC 325 MG tablet Take 325 mg by mouth daily.    Marland Kitchen atorvastatin (LIPITOR) 80 MG tablet Take 40 mg by mouth every morning.     . finasteride (PROSCAR) 5 MG tablet Take 5 mg by  mouth every morning.     . Flaxseed, Linseed, (FLAX SEEDS PO) Take 1 capsule by mouth daily.    . hydrochlorothiazide (HYDRODIURIL) 25 MG tablet Take 25 mg by mouth every morning.     Marland Kitchen levothyroxine (SYNTHROID, LEVOTHROID) 88 MCG tablet Take 88 mcg by mouth daily before breakfast.    . lisinopril (PRINIVIL,ZESTRIL) 40 MG tablet Take 40 mg by mouth every morning.     . metFORMIN (GLUCOPHAGE) 500 MG tablet Take 500 mg by mouth 2 (two) times daily with a meal.    . metoprolol succinate (TOPROL-XL) 50 MG 24 hr tablet Take 50 mg by mouth every morning. Take with or immediately following a  meal.    . nitroGLYCERIN (NITROSTAT) 0.4 MG SL tablet Place 1 tablet (0.4 mg total) under the tongue every 5 (five) minutes as needed for chest pain. 25 tablet 1  . oxyCODONE-acetaminophen (ROXICET) 5-325 MG tablet Take 1-2 tablets by mouth every 6 (six) hours as needed for moderate pain or severe pain. Post-operatively 20 tablet 0  . potassium chloride SA (K-DUR,KLOR-CON) 20 MEQ tablet Take 20 mEq by mouth every morning.     . senna-docusate (SENOKOT-S) 8.6-50 MG tablet Take 1 tablet by mouth 2 (two) times daily. While taking pain meds to prevent constipation 30 tablet 0  . sulfamethoxazole-trimethoprim (BACTRIM DS,SEPTRA DS) 800-160 MG tablet Take 1 tablet by mouth 2 (two) times daily. X 3 days to prevent infection with stent in place. 6 tablet 0   No facility-administered medications prior to visit.      Allergies:   Penicillins   Social History   Social History  . Marital status: Married    Spouse name: N/A  . Number of children: N/A  . Years of education: N/A   Social History Main Topics  . Smoking status: Former Smoker    Years: 10.00    Types: Cigarettes    Quit date: 10/26/1966  . Smokeless tobacco: Never Used  . Alcohol use No  . Drug use: No  . Sexual activity: Not Asked   Other Topics Concern  . None   Social History Narrative  . None     Family History:  The patient's family history includes CAD in his father; Tuberculosis in his mother.   ROS:   Please see the history of present illness.    Joint stiffness and lower back discomfort but otherwise unremarkable.  All other systems reviewed and are negative.   PHYSICAL EXAM:   VS:  BP (!) 142/80   Pulse (!) 53   Ht 5\' 5"  (1.651 m)   Wt 205 lb 6.4 oz (93.2 kg)   BMI 34.18 kg/m    GEN: Well nourished, well developed, in no acute distress  HEENT: normal  Neck: no JVD, carotid bruits, or masses Cardiac: RRR; no murmurs, rubs, or gallops,no edema  Respiratory:  clear to auscultation bilaterally, normal work  of breathing GI: soft, nontender, nondistended, + BS MS: no deformity or atrophy  Skin: warm and dry, no rash Neuro:  Alert and Oriented x 3, Strength and sensation are intact Psych: euthymic mood, full affect  Wt Readings from Last 3 Encounters:  06/12/16 205 lb 6.4 oz (93.2 kg)  12/27/15 192 lb (87.1 kg)  11/29/15 191 lb (86.6 kg)      Studies/Labs Reviewed:   EKG:  EKG  Sinus bradycardia, right bundle branch block, left axis deviation, old inferior infarct  Recent Labs: 11/29/2015: BUN 15; Creatinine, Ser 0.90; Hemoglobin 15.3; Potassium 3.7;  Sodium 142   Lipid Panel No results found for: CHOL, TRIG, HDL, CHOLHDL, VLDL, LDLCALC, LDLDIRECT  Additional studies/ records that were reviewed today include:  New data. All metabolic information is followed by Dr. Nehemiah SettlePolite.    ASSESSMENT:    1. Essential hypertension, benign   2. Coronary artery disease involving bypass graft of transplanted heart without angina pectoris   3. Exertional angina (HCC)   4. Right bundle branch block   5. Peripheral vascular disease, unspecified (HCC)     PLAN:  In order of problems listed above:  1. Recommended low salt diet, weight loss, and continued aerobic exercise. Discussed target blood pressure 140 mmHg or less. 2. Nitroglycerin supply is adequate. Lipid control is important. Target LDL less than 70. 3. Encouraged aerobic activity tibial collaterals. Nitroglycerin use if prolonged chest discomfort. 4. Unchanged 5. Asymptomatic area aerobic activity advocated.    Medication Adjustments/Labs and Tests Ordered: Current medicines are reviewed at length with the patient today.  Concerns regarding medicines are outlined above.  Medication changes, Labs and Tests ordered today are listed in the Patient Instructions below. Patient Instructions  Medication Instructions:  Your physician recommends that you continue on your current medications as directed. Please refer to the Current Medication  list given to you today.   Labwork: None ordered  Testing/Procedures: None ordered  Follow-Up: Your physician recommends that you schedule a follow-up appointment in:    Any Other Special Instructions Will Be Listed Below (If Applicable).  Your physician discussed the importance of regular exercise and recommended that you start or continue a regular exercise program for good health.   Low-Sodium Eating Plan Sodium raises blood pressure and causes water to be held in the body. Getting less sodium from food will help lower your blood pressure, reduce any swelling, and protect your heart, liver, and kidneys. We get sodium by adding salt (sodium chloride) to food. Most of our sodium comes from canned, boxed, and frozen foods. Restaurant foods, fast foods, and pizza are also very high in sodium. Even if you take medicine to lower your blood pressure or to reduce fluid in your body, getting less sodium from your food is important. WHAT IS MY PLAN? Most people should limit their sodium intake to 2,300 mg a day. Your health care provider recommends that you limit your sodium intake to 2 GRAMS a day.  WHAT DO I NEED TO KNOW ABOUT THIS EATING PLAN? For the low-sodium eating plan, you will follow these general guidelines:  Choose foods with a % Daily Value for sodium of less than 5% (as listed on the food label).   Use salt-free seasonings or herbs instead of table salt or sea salt.   Check with your health care provider or pharmacist before using salt substitutes.   Eat fresh foods.  Eat more vegetables and fruits.  Limit canned vegetables. If you do use them, rinse them well to decrease the sodium.   Limit cheese to 1 oz (28 g) per day.   Eat lower-sodium products, often labeled as "lower sodium" or "no salt added."  Avoid foods that contain monosodium glutamate (MSG). MSG is sometimes added to [place] food and some canned foods.  Check food labels (Nutrition Facts labels)  on foods to learn how much sodium is in one serving.  Eat more home-cooked food and less restaurant, buffet, and fast food.  When eating at a restaurant, ask that your food be prepared with less salt, or no salt if possible.  HOW  DO I READ FOOD LABELS FOR SODIUM INFORMATION? The Nutrition Facts label lists the amount of sodium in one serving of the food. If you eat more than one serving, you must multiply the listed amount of sodium by the number of servings. Food labels may also identify foods as:  Sodium free--Less than 5 mg in a serving.  Very low sodium--35 mg or less in a serving.  Low sodium--140 mg or less in a serving.  Light in sodium--50% less sodium in a serving. For example, if a food that usually has 300 mg of sodium is changed to become light in sodium, it will have 150 mg of sodium.  Reduced sodium--25% less sodium in a serving. For example, if a food that usually has 400 mg of sodium is changed to reduced sodium, it will have 300 mg of sodium. WHAT FOODS CAN I EAT? Grains Low-sodium cereals, including oats, puffed wheat and rice, and shredded wheat cereals. Low-sodium crackers. Unsalted rice and pasta. Lower-sodium bread.  Vegetables Frozen or fresh vegetables. Low-sodium or reduced-sodium canned vegetables. Low-sodium or reduced-sodium tomato sauce and paste. Low-sodium or reduced-sodium tomato and vegetable juices.  Fruits Fresh, frozen, and canned fruit. Fruit juice.  Meat and Other Protein Products Low-sodium canned tuna and salmon. Fresh or frozen meat, poultry, seafood, and fish. Lamb. Unsalted nuts. Dried beans, peas, and lentils without added salt. Unsalted canned beans. Homemade soups without salt. Eggs.  Dairy Milk. Soy milk. Ricotta cheese. Low-sodium or reduced-sodium cheeses. Yogurt.  Condiments Fresh and dried herbs and spices. Salt-free seasonings. Onion and garlic powders. Low-sodium varieties of mustard and ketchup. Fresh or refrigerated  horseradish. Lemon juice.  Fats and Oils Reduced-sodium salad dressings. Unsalted butter.  Other Unsalted popcorn and pretzels.  The items listed above may not be a complete list of recommended foods or beverages. Contact your dietitian for more options. WHAT FOODS ARE NOT RECOMMENDED? Grains Instant hot cereals. Bread stuffing, pancake, and biscuit mixes. Croutons. Seasoned rice or pasta mixes. Noodle soup cups. Boxed or frozen macaroni and cheese. Self-rising flour. Regular salted crackers. Vegetables Regular canned vegetables. Regular canned tomato sauce and paste. Regular tomato and vegetable juices. Frozen vegetables in sauces. Salted JamaicaFrench fries. Olives. Rosita FirePickles. Relishes. Sauerkraut. Salsa. Meat and Other Protein Products Salted, canned, smoked, spiced, or pickled meats, seafood, or fish. Bacon, ham, sausage, hot dogs, corned beef, chipped beef, and packaged luncheon meats. Salt pork. Jerky. Pickled herring. Anchovies, regular canned tuna, and sardines. Salted nuts. Dairy Processed cheese and cheese spreads. Cheese curds. Blue cheese and cottage cheese. Buttermilk.  Condiments Onion and garlic salt, seasoned salt, table salt, and sea salt. Canned and packaged gravies. Worcestershire sauce. Tartar sauce. Barbecue sauce. Teriyaki sauce. Soy sauce, including reduced sodium. Steak sauce. Fish sauce. Oyster sauce. Cocktail sauce. Horseradish that you find on the shelf. Regular ketchup and mustard. Meat flavorings and tenderizers. Bouillon cubes. Hot sauce. Tabasco sauce. Marinades. Taco seasonings. Relishes. Fats and Oils Regular salad dressings. Salted butter. Margarine. Ghee. Bacon fat.  Other Potato and tortilla chips. Corn chips and puffs. Salted popcorn and pretzels. Canned or dried soups. Pizza. Frozen entrees and pot pies.  The items listed above may not be a complete list of foods and beverages to avoid. Contact your dietitian for more information.   This information is  not intended to replace advice given to you by your health care provider. Make sure you discuss any questions you have with your health care provider.   Document Released: 04/03/2002 Document Revised: 11/02/2014 Document Reviewed:  08/16/2013 Elsevier Interactive Patient Education [employer]     If you need a refill on your cardiac medications before your next appointment, please call your pharmacy.      Signed, Lesleigh Noe, MD  06/12/2016 9:04 AM    [facility] [address] [place], [place], [place]  [address] Phone: [phone]; Fax: [phone]

## 2016-06-12 NOTE — Patient Instructions (Addendum)
Medication Instructions:  Your physician recommends that you continue on your current medications as directed. Please refer to the Current Medication list given to you today.   Labwork: None ordered  Testing/Procedures: None ordered  Follow-Up: Your physician recommends that you schedule a follow-up appointment in:    Any Other Special Instructions Will Be Listed Below (If Applicable).  Your physician discussed the importance of regular exercise and recommended that you start or continue a regular exercise program for good health.   Low-Sodium Eating Plan Sodium raises blood pressure and causes water to be held in the body. Getting less sodium from food will help lower your blood pressure, reduce any swelling, and protect your heart, liver, and kidneys. We get sodium by adding salt (sodium chloride) to food. Most of our sodium comes from canned, boxed, and frozen foods. Restaurant foods, fast foods, and pizza are also very high in sodium. Even if you take medicine to lower your blood pressure or to reduce fluid in your body, getting less sodium from your food is important. WHAT IS MY PLAN? Most people should limit their sodium intake to 2,300 mg a day. Your health care provider recommends that you limit your sodium intake to 2 GRAMS a day.  WHAT DO I NEED TO KNOW ABOUT THIS EATING PLAN? For the low-sodium eating plan, you will follow these general guidelines:  Choose foods with a % Daily Value for sodium of less than 5% (as listed on the food label).   Use salt-free seasonings or herbs instead of table salt or sea salt.   Check with your health care provider or pharmacist before using salt substitutes.   Eat fresh foods.  Eat more vegetables and fruits.  Limit canned vegetables. If you do use them, rinse them well to decrease the sodium.   Limit cheese to 1 oz (28 g) per day.   Eat lower-sodium products, often labeled as "lower sodium" or "no salt added."  Avoid foods  that contain monosodium glutamate (MSG). MSG is sometimes added to [place] food and some canned foods.  Check food labels (Nutrition Facts labels) on foods to learn how much sodium is in one serving.  Eat more home-cooked food and less restaurant, buffet, and fast food.  When eating at a restaurant, ask that your food be prepared with less salt, or no salt if possible.  HOW DO I READ FOOD LABELS FOR SODIUM INFORMATION? The Nutrition Facts label lists the amount of sodium in one serving of the food. If you eat more than one serving, you must multiply the listed amount of sodium by the number of servings. Food labels may also identify foods as:  Sodium free--Less than 5 mg in a serving.  Very low sodium--35 mg or less in a serving.  Low sodium--140 mg or less in a serving.  Light in sodium--50% less sodium in a serving. For example, if a food that usually has 300 mg of sodium is changed to become light in sodium, it will have 150 mg of sodium.  Reduced sodium--25% less sodium in a serving. For example, if a food that usually has 400 mg of sodium is changed to reduced sodium, it will have 300 mg of sodium. WHAT FOODS CAN I EAT? Grains Low-sodium cereals, including oats, puffed wheat and rice, and shredded wheat cereals. Low-sodium crackers. Unsalted rice and pasta. Lower-sodium bread.  Vegetables Frozen or fresh vegetables. Low-sodium or reduced-sodium canned vegetables. Low-sodium or reduced-sodium tomato sauce and paste. Low-sodium or reduced-sodium tomato and  vegetable juices.  Fruits Fresh, frozen, and canned fruit. Fruit juice.  Meat and Other Protein Products Low-sodium canned tuna and salmon. Fresh or frozen meat, poultry, seafood, and fish. Lamb. Unsalted nuts. Dried beans, peas, and lentils without added salt. Unsalted canned beans. Homemade soups without salt. Eggs.  Dairy Milk. Soy milk. Ricotta cheese. Low-sodium or reduced-sodium cheeses. Yogurt.  Condiments Fresh  and dried herbs and spices. Salt-free seasonings. Onion and garlic powders. Low-sodium varieties of mustard and ketchup. Fresh or refrigerated horseradish. Lemon juice.  Fats and Oils Reduced-sodium salad dressings. Unsalted butter.  Other Unsalted popcorn and pretzels.  The items listed above may not be a complete list of recommended foods or beverages. Contact your dietitian for more options. WHAT FOODS ARE NOT RECOMMENDED? Grains Instant hot cereals. Bread stuffing, pancake, and biscuit mixes. Croutons. Seasoned rice or pasta mixes. Noodle soup cups. Boxed or frozen macaroni and cheese. Self-rising flour. Regular salted crackers. Vegetables Regular canned vegetables. Regular canned tomato sauce and paste. Regular tomato and vegetable juices. Frozen vegetables in sauces. Salted JamaicaFrench fries. Olives. Rosita FirePickles. Relishes. Sauerkraut. Salsa. Meat and Other Protein Products Salted, canned, smoked, spiced, or pickled meats, seafood, or fish. Bacon, ham, sausage, hot dogs, corned beef, chipped beef, and packaged luncheon meats. Salt pork. Jerky. Pickled herring. Anchovies, regular canned tuna, and sardines. Salted nuts. Dairy Processed cheese and cheese spreads. Cheese curds. Blue cheese and cottage cheese. Buttermilk.  Condiments Onion and garlic salt, seasoned salt, table salt, and sea salt. Canned and packaged gravies. Worcestershire sauce. Tartar sauce. Barbecue sauce. Teriyaki sauce. Soy sauce, including reduced sodium. Steak sauce. Fish sauce. Oyster sauce. Cocktail sauce. Horseradish that you find on the shelf. Regular ketchup and mustard. Meat flavorings and tenderizers. Bouillon cubes. Hot sauce. Tabasco sauce. Marinades. Taco seasonings. Relishes. Fats and Oils Regular salad dressings. Salted butter. Margarine. Ghee. Bacon fat.  Other Potato and tortilla chips. Corn chips and puffs. Salted popcorn and pretzels. Canned or dried soups. Pizza. Frozen entrees and pot pies.  The  items listed above may not be a complete list of foods and beverages to avoid. Contact your dietitian for more information.   This information is not intended to replace advice given to you by your health care provider. Make sure you discuss any questions you have with your health care provider.   Document Released: 04/03/2002 Document Revised: 11/02/2014 Document Reviewed: 08/16/2013 Elsevier Interactive Patient Education [employer]     If you need a refill on your cardiac medications before your next appointment, please call your pharmacy.

## 2016-06-19 ENCOUNTER — Encounter (HOSPITAL_COMMUNITY): Payer: Self-pay | Admitting: *Deleted

## 2016-06-19 ENCOUNTER — Emergency Department (HOSPITAL_COMMUNITY)
Admission: EM | Admit: 2016-06-19 | Discharge: 2016-06-19 | Disposition: A | Discharge status: Home / Self Care | Payer: PPO | Attending: Emergency Medicine | Admitting: Emergency Medicine

## 2016-06-19 DIAGNOSIS — Y929 Unspecified place or not applicable: Secondary | ICD-10-CM | POA: Insufficient documentation

## 2016-06-19 DIAGNOSIS — Z23 Encounter for immunization: Secondary | ICD-10-CM | POA: Diagnosis not present

## 2016-06-19 DIAGNOSIS — Z87891 Personal history of nicotine dependence: Secondary | ICD-10-CM | POA: Insufficient documentation

## 2016-06-19 DIAGNOSIS — E119 Type 2 diabetes mellitus without complications: Secondary | ICD-10-CM | POA: Insufficient documentation

## 2016-06-19 DIAGNOSIS — Z7984 Long term (current) use of oral hypoglycemic drugs: Secondary | ICD-10-CM | POA: Insufficient documentation

## 2016-06-19 DIAGNOSIS — I2510 Atherosclerotic heart disease of native coronary artery without angina pectoris: Secondary | ICD-10-CM | POA: Insufficient documentation

## 2016-06-19 DIAGNOSIS — I10 Essential (primary) hypertension: Secondary | ICD-10-CM | POA: Insufficient documentation

## 2016-06-19 DIAGNOSIS — E039 Hypothyroidism, unspecified: Secondary | ICD-10-CM | POA: Insufficient documentation

## 2016-06-19 DIAGNOSIS — Z7982 Long term (current) use of aspirin: Secondary | ICD-10-CM | POA: Insufficient documentation

## 2016-06-19 DIAGNOSIS — S61412A Laceration without foreign body of left hand, initial encounter: Secondary | ICD-10-CM | POA: Insufficient documentation

## 2016-06-19 DIAGNOSIS — Y939 Activity, unspecified: Secondary | ICD-10-CM | POA: Diagnosis not present

## 2016-06-19 DIAGNOSIS — Z79899 Other long term (current) drug therapy: Secondary | ICD-10-CM | POA: Insufficient documentation

## 2016-06-19 DIAGNOSIS — Y999 Unspecified external cause status: Secondary | ICD-10-CM | POA: Diagnosis not present

## 2016-06-19 DIAGNOSIS — W268XXA Contact with other sharp object(s), not elsewhere classified, initial encounter: Secondary | ICD-10-CM | POA: Insufficient documentation

## 2016-06-19 DIAGNOSIS — S6992XA Unspecified injury of left wrist, hand and finger(s), initial encounter: Secondary | ICD-10-CM | POA: Diagnosis not present

## 2016-06-19 MED ORDER — LIDOCAINE-EPINEPHRINE 2 %-1:100000 IJ SOLN
20.0000 mL | Freq: Once | INTRAMUSCULAR | Status: AC
Start: 2016-06-19 — End: 2016-06-19
  Administered 2016-06-19: 20 mL
  Filled 2016-06-19: qty 1

## 2016-06-19 MED ORDER — CEPHALEXIN 500 MG PO CAPS
500.0000 mg | ORAL_CAPSULE | Freq: Once | ORAL | Status: AC
  Administered 2016-06-19: 500 mg via ORAL
  Filled 2016-06-19: qty 1

## 2016-06-19 MED ORDER — CEPHALEXIN 500 MG PO CAPS: 500.0000 mg | ORAL_CAPSULE | Freq: Four times a day (QID) | ORAL | 0 refills | Status: DC

## 2016-06-19 MED ORDER — TETANUS-DIPHTH-ACELL PERTUSSIS 5-2.5-18.5 LF-MCG/0.5 IM SUSP
0.5000 mL | Freq: Once | INTRAMUSCULAR | Status: AC
  Administered 2016-06-19: 0.5 mL via INTRAMUSCULAR
  Filled 2016-06-19: qty 0.5

## 2016-06-19 NOTE — ED Provider Notes (Signed)
[facility]-EMERGENCY DEPT Provider Note   CSN: [id number] Arrival date & time: 06/19/16  1220  By signing my name below, I, Modena Jansky, attest that this documentation has been prepared under the direction and in the presence of non-physician practitioner, Wynetta Emery, PA-C. Electronically Signed: Modena Jansky, Scribe. 06/19/2016. 1:52 PM.  History   Chief Complaint Chief Complaint  Patient presents with  . Extremity Laceration   The history is provided by the patient. No language interpreter was used.   HPI Comments: [name] [name] is a 75 y.o. male with a hx of DM who presents to the Emergency Department complaining of a left hand wound that occurred about 2 hours ago. He reports that cut his left palm on a metal fence. He states his tetanus is not UTD, he is right-handed, and he takes aspirin daily. He denies any Illness, weakness, reduced range of motion.  Past Medical History:  Diagnosis Date  . Benign prostatic hypertrophy with lower urinary tract symptoms (LUTS)   . Bilateral edema of lower extremity    IMPROVED W/ TED HOSE  . CAD (coronary artery disease)    cardiologist-  dr Verdis Prime--  s/p  cabg x6 in 1998  . Heart murmur   . Hiatal hernia   . History of kidney stones   . Hydronephrosis, left   . Hyperlipidemia   . Hypertension   . Hypothyroidism   . Left ureteral stone   . Nephrolithiasis    right side per ct  non-obstructive  . RBBB (right bundle branch block)   . 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 Sigmoid diverticulosis   . Sinus bradycardia   . Type 2 diabetes mellitus [facility])     Patient Active Problem List   Diagnosis Date Noted  . Mild mitral regurgitation 05/22/2015  . Chronic venous insufficiency 12/07/2014  . Type 2 diabetes mellitus with peripheral artery disease (HCC) 12/07/2014  . Peripheral vascular disease, unspecified (HCC) 11/17/2013  . Essential hypertension, benign 11/17/2013  . CAD (coronary artery disease) of artery bypass graft 11/17/2013   Class: Chronic  . Other and unspecified hyperlipidemia 11/17/2013  . Obesity, unspecified 11/17/2013  . Pure hypercholesterolemia 11/17/2013  . Unspecified hypothyroidism 11/17/2013  . Type II or unspecified type diabetes mellitus without mention of complication, not stated as uncontrolled 11/17/2013  . Right bundle branch block 11/17/2013  . Other symptoms involving cardiovascular system 11/17/2013  . Nonspecific abnormal electrocardiogram (ECG) (EKG) 11/17/2013  . Thyrotoxicosis without mention of goiter or other cause, without mention of thyrotoxic crisis or storm 11/17/2013  . Pain in joint, shoulder region 11/17/2013  . Abnormal nuclear stress test 06/16/2013  . Exertional angina (HCC) 06/16/2013    Past Surgical History:  Procedure Laterality Date  . CATARACT EXTRACTION W/ INTRAOCULAR LENS  IMPLANT, BILATERAL  2011  . CORONARY ARTERY BYPASS GRAFT  1998   CABG X 6 --   LIMA  to the LAD and  other undefined SVG's  . CYSTOSCOPY WITH BIOPSY N/A 11/29/2015   Procedure: CYSTOSCOPY WITH BIOPSY AND FULGERATION;  Surgeon: Sebastian Ache, MD;  Location: [facility];  Service: Urology;  Laterality: N/A;  . CYSTOSCOPY/RETROGRADE/URETEROSCOPY/STONE EXTRACTION WITH BASKET Left 11/29/2015   Procedure: CYSTOSCOPY/RETROGRADE/DIAGNOSTIC URETEROSCOPY/STONE EXTRACTION;  Surgeon: Sebastian Ache, MD;  Location[facility];  Service: Urology;  Laterality: Left;  . EXTRACORPOREAL SHOCK WAVE LITHOTRIPSY Left 08-12-2015  . LEFT HEART CATHETERIZATION WITH CORONARY/GRAFT ANGIOGRAM  06/16/2013   Procedure: LEFT HEART CATHETERIZATION WITH CORONARY/GRAFT ANGIOGRAM;  Surgeon: Lesleigh Noe, MD;  Location: [facility] C[facility] LAB;  Service: Cardiovascular; Native vessel occlusive disease w/ ostial total occlusion RCA, dLM, & heavy calicification LM LAD CX RCA/  SVG to diagonal patent, SVG to marginal 1 patent & 2 occluded, LIMA to dLAD patent/ dCFX collateral from obtuse marginal graft/  normal LVF    . TRANSTHORACIC ECHOCARDIOGRAM  05-23-2015  dr Sherilyn Cooter smith   mild LVH,  grade 1 diastolic dysfunction,  ef 55-060%,  mild AV calcifacation without stenosis,  mild AR and MR,  moderate LAE,  trivial PR and TR,  mild increase pulmonary pressure       Home Medications    Prior to Admission medications   Medication Sig Start Date End Date Taking? Authorizing Provider  amLODipine (NORVASC) 5 MG tablet TAKE 1 TABLET BY MOUTH EVERY DAY Patient taking differently: TAKE 5 MG  BY MOUTH EVERY MORNING    Lyn Records, MD  aspirin EC 325 MG tablet Take 325 mg by mouth daily.    Historical Provider, MD  atorvastatin (LIPITOR) 80 MG tablet Take 40 mg by mouth every morning.     Historical Provider, MD  finasteride (PROSCAR) 5 MG tablet Take 5 mg by mouth every morning.     Historical Provider, MD  Flaxseed, Linseed, (FLAX SEEDS PO) Take 1 capsule by mouth daily.    Historical Provider, MD  hydrochlorothiazide (HYDRODIURIL) 25 MG tablet Take 25 mg by mouth every morning.     Historical Provider, MD  levothyroxine (SYNTHROID, LEVOTHROID) 88 MCG tablet Take 88 mcg by mouth daily before breakfast.    Historical Provider, MD  lisinopril (PRINIVIL,ZESTRIL) 40 MG tablet Take 40 mg by mouth every morning.     Historical Provider, MD  metFORMIN (GLUCOPHAGE) 500 MG tablet Take 500 mg by mouth 2 (two) times daily with a meal.    Historical Provider, MD  metoprolol succinate (TOPROL-XL) 50 MG 24 hr tablet Take 50 mg by mouth every morning. Take with or immediately following a meal.    Historical Provider, MD  nitroGLYCERIN (NITROSTAT) 0.4 MG SL tablet Place 1 tablet (0.4 mg total) under the tongue every 5 (five) minutes as needed for chest pain. 03/09/16   Lyn Records, MD  oxyCODONE-acetaminophen (ROXICET) 5-325 MG tablet Take 1-2 tablets by mouth every 6 (six) hours as needed for moderate pain or severe pain. Post-operatively 11/29/15   Sebastian Ache, MD  potassium chloride SA (K-DUR,KLOR-CON) 20 MEQ tablet Take 20  mEq by mouth every morning.     Historical Provider, MD  senna-docusate (SENOKOT-S) 8.6-50 MG tablet Take 1 tablet by mouth 2 (two) times daily. While taking pain meds to prevent constipation 11/29/15   Sebastian Ache, MD  tamsulosin (FLOMAX) 0.4 MG CAPS capsule Take 0.4 mg by mouth daily. 03/09/16   Historical Provider, MD    Family History Family History  Problem Relation Age of Onset  . Tuberculosis Mother   . CAD Father     Social History Social History  Substance Use Topics  . Smoking status: Former Smoker    Years: 10.00    Types: Cigarettes    Quit date: 10/26/1966  . Smokeless tobacco: Never Used  . Alcohol use No     Allergies   Penicillins   Review of Systems Review of Systems 10 Systems reviewed and all are negative for acute change except as noted in the HPI.  Physical Exam Updated Vital Signs BP 125/74 (BP Location: Left Arm)   Pulse (!) 51  Temp 98 F (36.7 C) (Oral)   Resp 18   Ht 5\' 5"  (1.651 m)   Wt 205 lb (93 kg)   SpO2 97%   BMI 34.11 kg/m   Physical Exam  Musculoskeletal:       Hands: 1.5 cm full thickness flap-like laceration. Bleeding controlled and no gross contamination.      ED Treatments / Results  DIAGNOSTIC STUDIES: Oxygen Saturation is 97% on RA, normal by my interpretation.    COORDINATION OF CARE: 1:56 PM- Pt advised of plan for treatment, which includes a laceration repair in the ED, and pt agrees.  Labs (all labs ordered are listed, but only abnormal results are displayed) Labs Reviewed - No data to display  EKG  EKG Interpretation None       Radiology No results found.  Procedures .Marland KitchenLaceration Repair Date/Time: 06/19/2016 3:12 PM Performed by: Wynetta Emery Authorized by: Wynetta Emery   Consent:    Consent obtained:  Verbal   Consent given by:  Patient Laceration details:    Location:  Hand   Length (cm):  1.5 Repair type:    Repair type:  Simple Pre-procedure details:    Preparation:   Patient was prepped and draped in usual sterile fashion Exploration:    Wound exploration: wound explored through full range of motion and entire depth of wound probed and visualized     Contaminated: no   Treatment:    Area cleansed with:  Saline   Amount of cleaning:  Extensive   Irrigation solution:  Sterile saline   Irrigation volume:  2 L   Irrigation method:  Syringe   Visualized foreign bodies/material removed: no   Skin repair:    Repair method:  Sutures   Suture size:  5-0   Wound skin closure material used: Ethilon.   Suture technique:  Simple interrupted   Number of sutures:  2 Approximation:    Approximation:  Close   Vermilion border: well-aligned   Post-procedure details:    Dressing:  Antibiotic ointment   Patient tolerance of procedure:  Tolerated well, no immediate complications   (including critical care time)  Medications Ordered in ED Medications  lidocaine-EPINEPHrine (XYLOCAINE W/EPI) 2 %-1:100000 (with pres) injection 20 mL (not administered)  Tdap (BOOSTRIX) injection 0.5 mL (not administered)     Initial Impression / Assessment and Plan / ED Course  I have reviewed the triage vital signs and the nursing notes.  Pertinent labs & imaging results that were available during my care of the patient were reviewed by me and considered in my medical decision making (see chart for details).  Clinical Course    Vitals:   06/19/16 1241 06/19/16 1242  BP: 125/74   Pulse: (!) 51   Resp: 18   Temp: 98 F (36.7 C)   TempSrc: Oral   SpO2: 97%   Weight:  93 kg  Height:  5\' 5"  (1.651 m)    Medications  lidocaine-EPINEPHrine (XYLOCAINE W/EPI) 2 %-1:100000 (with pres) injection 20 mL (20 mLs Other Given 06/19/16 1449)  Tdap (BOOSTRIX) injection 0.5 mL (0.5 mLs Intramuscular Given 06/19/16 1449)  cephALEXin (KEFLEX) capsule 500 mg (500 mg Oral Given 06/19/16 1451)    [name] [name] is 75 y.o. male presenting with Laceration to nondominant hand, we'll  prophylactically start him on antibiotics given his diabetes and dirty wound, tetanus is updated, patient neurovascularly intact with no nerve or tendon involvement. Counseled on wound care and return precautions.  Evaluation does not show pathology  that would require ongoing emergent intervention or inpatient treatment. Pt is hemodynamically stable and mentating appropriately. Discussed findings and plan with patient/guardian, who agrees with care plan. All questions answered. Return precautions discussed and outpatient follow up given.      Final Clinical Impressions(s) / ED Diagnoses   Final diagnoses:  None    New Prescriptions New Prescriptions   No medications on file   I personally performed the services described in this documentation, which was scribed in my presence. The recorded information has been reviewed and is accurate.     Wynetta Emeryicole Jaydeen Odor, PA-C 06/19/16 1513    Lavera Guiseana Duo Liu, MD 06/19/16 2011

## 2016-06-19 NOTE — Discharge Instructions (Signed)
Keep wound dry and do not remove dressing for 24 hours if possible. After that, wash gently morning and night (every 12 hours) with soap and water. Use a topical antibiotic ointment and cover with a bandaid or gauze.    Do NOT use rubbing alcohol or hydrogen peroxide, do not soak the area   Present to your primary care doctor or the urgent care of your choice, or the ED for suture removal in 8-9 days.   Every attempt was made to remove foreign body (contaminants) from the wound.  However, there is always a chance that some may remain in the wound. This can  increase your risk of infection.   If you see signs of infection (warmth, redness, tenderness, pus, sharp increase in pain, fever, red streaking in the skin) immediately return to the emergency department.

## 2016-06-19 NOTE — ED Triage Notes (Addendum)
Pt complains of laceration to left palm. Pt states he cut his palm on a metal fence at ~1130AM today. Last tetanus shot unknown. Bleeding controlled at home. Pt takes aspirin daily.

## 2016-06-27 ENCOUNTER — Encounter (HOSPITAL_COMMUNITY): Payer: Self-pay | Admitting: Emergency Medicine

## 2016-06-27 ENCOUNTER — Emergency Department (HOSPITAL_COMMUNITY)
Admission: EM | Admit: 2016-06-27 | Discharge: 2016-06-27 | Disposition: A | Discharge status: Home / Self Care | Payer: PPO | Attending: Emergency Medicine | Admitting: Emergency Medicine

## 2016-06-27 DIAGNOSIS — I10 Essential (primary) hypertension: Secondary | ICD-10-CM | POA: Diagnosis not present

## 2016-06-27 DIAGNOSIS — Z4802 Encounter for removal of sutures: Secondary | ICD-10-CM | POA: Diagnosis not present

## 2016-06-27 DIAGNOSIS — Z79899 Other long term (current) drug therapy: Secondary | ICD-10-CM | POA: Insufficient documentation

## 2016-06-27 DIAGNOSIS — Z87891 Personal history of nicotine dependence: Secondary | ICD-10-CM | POA: Diagnosis not present

## 2016-06-27 DIAGNOSIS — E039 Hypothyroidism, unspecified: Secondary | ICD-10-CM | POA: Diagnosis not present

## 2016-06-27 DIAGNOSIS — E119 Type 2 diabetes mellitus without complications: Secondary | ICD-10-CM | POA: Insufficient documentation

## 2016-06-27 DIAGNOSIS — Z7984 Long term (current) use of oral hypoglycemic drugs: Secondary | ICD-10-CM | POA: Diagnosis not present

## 2016-06-27 DIAGNOSIS — Z951 Presence of aortocoronary bypass graft: Secondary | ICD-10-CM | POA: Insufficient documentation

## 2016-06-27 DIAGNOSIS — Z7982 Long term (current) use of aspirin: Secondary | ICD-10-CM | POA: Insufficient documentation

## 2016-06-27 DIAGNOSIS — I2510 Atherosclerotic heart disease of native coronary artery without angina pectoris: Secondary | ICD-10-CM | POA: Insufficient documentation

## 2016-06-27 NOTE — ED Provider Notes (Signed)
[facility]-EMERGENCY DEPT Provider Note   CSN: [id number] Arrival date & time: 06/27/16  1222  By signing my name below, I, Rosario AdieWilliam Andrew Hiatt, attest that this documentation has been prepared under the direction and in the presence of [facility] Lake[facility]ily Malaina Mortellaro, PA-C.  Electronically Signed: Rosario AdieWilliam Andrew Hiatt, ED Scribe. 06/27/16. 12:55 PM.  History   Chief Complaint Chief Complaint  Patient presents with  . Suture / Staple Removal   The history is provided by the patient. No language interpreter was used.   HPI Comments: [name] [name] is a right-hand dominant 75 y.o. male with a PMHx significant of DM2, who presents to the Emergency Department for suture removal s/p laceration that occurred to his left palmar area on 06/19/16 (8 days ago). Pt reports that his left hand came across the sharp edge of a metal fence, sustaining the laceration. 2 sutures were placed in the area after the time of incident during initial encounter in the ED. He states that the area has been healing well and denies pain, redness, bleeding, or discharge to the area. He has been applying Neosporin to the area since the laceration repair. Denies weakness, numbness, or fevers.   Past Medical History:  Diagnosis Date  . Benign prostatic hypertrophy with lower urinary tract symptoms (LUTS)   . Bilateral edema of lower extremity    IMPROVED W/ TED HOSE  . CAD (coronary artery disease)    cardiologist-  dr Verdis Primehenry smith--  s/p  cabg x6 in 1998  . Heart murmur   . Hiatal hernia   . History of kidney stones   . Hydronephrosis, left   . Hyperlipidemia   . Hypertension   . Hypothyroidism   . Left ureteral stone   . Nephrolithiasis    right side per ct  non-obstructive  . RBBB (right bundle branch block)   . 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 Sigmoid diverticulosis   . Sinus bradycardia   . Type 2 diabetes mellitus [facility](HCC)    Patient Active Problem List   Diagnosis Date Noted  . Mild mitral regurgitation 05/22/2015  . Chronic venous  insufficiency 12/07/2014  . Type 2 diabetes mellitus with peripheral artery disease (HCC) 12/07/2014  . Peripheral vascular disease, unspecified (HCC) 11/17/2013  . Essential hypertension, benign 11/17/2013  . CAD (coronary artery disease) of artery bypass graft 11/17/2013    Class: Chronic  . Other and unspecified hyperlipidemia 11/17/2013  . Obesity, unspecified 11/17/2013  . Pure hypercholesterolemia 11/17/2013  . Unspecified hypothyroidism 11/17/2013  . Type II or unspecified type diabetes mellitus without mention of complication, not stated as uncontrolled 11/17/2013  . Right bundle branch block 11/17/2013  . Other symptoms involving cardiovascular system 11/17/2013  . Nonspecific abnormal electrocardiogram (ECG) (EKG) 11/17/2013  . Thyrotoxicosis without mention of goiter or other cause, without mention of thyrotoxic crisis or storm 11/17/2013  . Pain in joint, shoulder region 11/17/2013  . Abnormal nuclear stress test 06/16/2013  . Exertional angina (HCC) 06/16/2013   Past Surgical History:  Procedure Laterality Date  . CATARACT EXTRACTION W/ INTRAOCULAR LENS  IMPLANT, BILATERAL  2011  . CORONARY ARTERY BYPASS GRAFT  1998   CABG X 6 --   LIMA  to the LAD and  other undefined SVG's  . CYSTOSCOPY WITH BIOPSY N/A 11/29/2015   Procedure: CYSTOSCOPY WITH BIOPSY AND FULGERATION;  Surgeon: Sebastian Acheheodore Manny, MD;  [facility];  Service: Urology;  Laterality: N/A;  . CYSTOSCOPY/RETROGRADE/URETEROSCOPY/STONE EXTRACTION WITH BASKET Left 11/29/2015  Procedure: CYSTOSCOPY/RETROGRADE/DIAGNOSTIC URETEROSCOPY/STONE EXTRACTION;  Surgeon: Sebastian Ache, MD;  Location: [facility];  Service: Urology;  Laterality: Left;  . EXTRACORPOREAL SHOCK WAVE LITHOTRIPSY Left 08-12-2015  . LEFT HEART CATHETERIZATION WITH CORONARY/GRAFT ANGIOGRAM  06/16/2013   Procedure: LEFT HEART CATHETERIZATION WITH Isabel Caprice;  Surgeon: Lesleigh Noe, MD;  Location: [facility]  CATH LAB;  Service: Cardiovascular; Native vessel occlusive disease w/ ostial total occlusion RCA, dLM, & heavy calicification LM LAD CX RCA/  SVG to diagonal patent, SVG to marginal 1 patent & 2 occluded, LIMA to dLAD patent/ dCFX collateral from obtuse marginal graft/  normal LVF  . TRANSTHORACIC ECHOCARDIOGRAM  05-23-2015  dr Sherilyn Cooter smith   mild LVH,  grade 1 diastolic dysfunction,  ef 55-060%,  mild AV calcifacation without stenosis,  mild AR and MR,  moderate LAE,  trivial PR and TR,  mild increase pulmonary pressure    Home Medications    Prior to Admission medications   Medication Sig Start Date End Date Taking? Authorizing Provider  amLODipine (NORVASC) 5 MG tablet TAKE 1 TABLET BY MOUTH EVERY DAY Patient taking differently: TAKE 5 MG  BY MOUTH EVERY MORNING    Lyn Records, MD  aspirin EC 325 MG tablet Take 325 mg by mouth daily.    Historical Provider, MD  atorvastatin (LIPITOR) 80 MG tablet Take 40 mg by mouth every morning.     Historical Provider, MD  cephALEXin (KEFLEX) 500 MG capsule Take 1 capsule (500 mg total) by mouth 4 (four) times daily. 06/19/16   Nicole Pisciotta, PA-C  finasteride (PROSCAR) 5 MG tablet Take 5 mg by mouth every morning.     Historical Provider, MD  Flaxseed, Linseed, (FLAX SEEDS PO) Take 1 capsule by mouth daily.    Historical Provider, MD  hydrochlorothiazide (HYDRODIURIL) 25 MG tablet Take 25 mg by mouth every morning.     Historical Provider, MD  levothyroxine (SYNTHROID, LEVOTHROID) 88 MCG tablet Take 88 mcg by mouth daily before breakfast.    Historical Provider, MD  lisinopril (PRINIVIL,ZESTRIL) 40 MG tablet Take 40 mg by mouth every morning.     Historical Provider, MD  metFORMIN (GLUCOPHAGE) 500 MG tablet Take 500 mg by mouth 2 (two) times daily with a meal.    Historical Provider, MD  metoprolol succinate (TOPROL-XL) 50 MG 24 hr tablet Take 50 mg by mouth every morning. Take with or immediately following a meal.    Historical Provider, MD    nitroGLYCERIN (NITROSTAT) 0.4 MG SL tablet Place 1 tablet (0.4 mg total) under the tongue every 5 (five) minutes as needed for chest pain. 03/09/16   Lyn Records, MD  oxyCODONE-acetaminophen (ROXICET) 5-325 MG tablet Take 1-2 tablets by mouth every 6 (six) hours as needed for moderate pain or severe pain. Post-operatively 11/29/15   Sebastian Ache, MD  potassium chloride SA (K-DUR,KLOR-CON) 20 MEQ tablet Take 20 mEq by mouth every morning.     Historical Provider, MD  senna-docusate (SENOKOT-S) 8.6-50 MG tablet Take 1 tablet by mouth 2 (two) times daily. While taking pain meds to prevent constipation 11/29/15   Sebastian Ache, MD  tamsulosin (FLOMAX) 0.4 MG CAPS capsule Take 0.4 mg by mouth daily. 03/09/16   Historical Provider, MD   Family History Family History  Problem Relation Age of Onset  . Tuberculosis Mother   . CAD Father    Social History Social History  Substance Use Topics  . Smoking status: Former Smoker    Years: 10.00  Types: Cigarettes    Quit date: 10/26/1966  . Smokeless tobacco: Never Used  . Alcohol use No   Allergies   Penicillins  Review of Systems Review of Systems  Constitutional: Negative for chills and fever.  Musculoskeletal: Negative for arthralgias and joint swelling.  Skin: Positive for wound.  Neurological: Negative for weakness and numbness.   Physical Exam Updated Vital Signs BP 121/62 (BP Location: Right Arm)   Pulse (!) 56   Temp 97.9 F (36.6 C) (Oral)   Resp 18   SpO2 95%   Physical Exam  Constitutional: He appears well-developed and well-nourished.  HENT:  Head: Normocephalic and atraumatic.  Neck: Neck supple.  Pulmonary/Chest: Effort normal.  Neurological: He is alert.  Skin: Skin is warm and dry. Capillary refill takes less than 2 seconds. No erythema.  Left palmar surface with healing laceration. Scab in place. No erythema, edema, warmth, discharge, or tenderness. Full active range of motion of all digits, strength 5/5,  sensation intact, capillary refill < 2 seconds.   Nursing note and vitals reviewed.  ED Treatments / Results  DIAGNOSTIC STUDIES: Oxygen Saturation is 95% on RA, normal by my interpretation.   COORDINATION OF CARE: 12:55 PM-Discussed next steps with pt including suture removal. Pt verbalized understanding and is agreeable with the plan.   Procedures Procedures (including critical care time)  Medications Ordered in ED Medications - No data to display  Initial Impression / Assessment and Plan / ED Course  I have reviewed the triage vital signs and the nursing notes.  Pertinent labs & imaging results that were available during my care of the patient were reviewed by me and considered in my medical decision making (see chart for details).  Clinical Course   SUTURE REMOVAL Performed by: Trixie Dredge  Consent: Verbal consent obtained. Patient identity confirmed: provided demographic data Time out: Immediately prior to procedure a "time out" was called to verify the correct patient, procedure, equipment, support staff and site/side marked as required.  Location details: left hand palm  Wound Appearance: clean  Sutures/Staples Removed: 2  Facility: sutures placed in this facility Patient tolerance: Patient tolerated the procedure well with no immediate complications.     Suture removal  Pt to ER for staple/suture removal and wound check as above. Procedure tolerated well. Vitals normal, no signs of infection. Scar minimization & return precautions given at d/c. Discussed result, findings, treatment, and follow up  with patient.  Pt given return precautions.  Pt verbalizes understanding and agrees with plan.      Final Clinical Impressions(s) / ED Diagnoses   Final diagnoses:  Visit for suture removal   New Prescriptions Discharge Medication List as of 06/27/2016 12:55 PM     I personally performed the services described in this documentation, which was scribed in my  presence. The recorded information has been reviewed and is accurate.     Trixie Dredge, PA-C 06/27/16 1407    Tilden Fossa, MD 06/28/16 [phone]

## 2016-06-27 NOTE — ED Triage Notes (Signed)
He states he is here for suture removal--stiches in left hand. He arrives with it wrapped and I do not disturb it in triage. He reports no problem(s) with sutured area.

## 2016-06-27 NOTE — Discharge Instructions (Signed)
Read the information below.  You may return to the Emergency Department at any time for worsening condition or any new symptoms that concern you.  If you develop redness, swelling, pus draining from the wound, or fevers greater than 100.4, return to the ER immediately for a recheck.   °

## 2016-09-01 DIAGNOSIS — I10 Essential (primary) hypertension: Secondary | ICD-10-CM | POA: Diagnosis not present

## 2016-09-01 DIAGNOSIS — I739 Peripheral vascular disease, unspecified: Secondary | ICD-10-CM | POA: Diagnosis not present

## 2016-09-01 DIAGNOSIS — E7800 Pure hypercholesterolemia, unspecified: Secondary | ICD-10-CM | POA: Diagnosis not present

## 2016-09-01 DIAGNOSIS — E1151 Type 2 diabetes mellitus with diabetic peripheral angiopathy without gangrene: Secondary | ICD-10-CM | POA: Diagnosis not present

## 2016-09-01 DIAGNOSIS — E039 Hypothyroidism, unspecified: Secondary | ICD-10-CM | POA: Diagnosis not present

## 2016-12-22 ENCOUNTER — Encounter: Payer: Self-pay | Admitting: Family

## 2016-12-28 ENCOUNTER — Ambulatory Visit (INDEPENDENT_AMBULATORY_CARE_PROVIDER_SITE_OTHER): Payer: PPO | Admitting: Family

## 2016-12-28 ENCOUNTER — Ambulatory Visit (HOSPITAL_COMMUNITY)
Admission: RE | Admit: 2016-12-28 | Discharge: 2016-12-28 | Disposition: A | Discharge status: Home / Self Care | Payer: PPO | Source: Ambulatory Visit | Attending: Family | Admitting: Family

## 2016-12-28 ENCOUNTER — Encounter: Payer: Self-pay | Admitting: Family

## 2016-12-28 VITALS — BP 156/82 | HR 60 | Temp 97.6°F | Resp 20 | Ht 65.0 in | Wt 203.0 lb

## 2016-12-28 DIAGNOSIS — E1151 Type 2 diabetes mellitus with diabetic peripheral angiopathy without gangrene: Secondary | ICD-10-CM | POA: Diagnosis not present

## 2016-12-28 DIAGNOSIS — R0989 Other specified symptoms and signs involving the circulatory and respiratory systems: Secondary | ICD-10-CM

## 2016-12-28 DIAGNOSIS — I739 Peripheral vascular disease, unspecified: Secondary | ICD-10-CM | POA: Diagnosis not present

## 2016-12-28 DIAGNOSIS — I872 Venous insufficiency (chronic) (peripheral): Secondary | ICD-10-CM | POA: Diagnosis not present

## 2016-12-28 DIAGNOSIS — I10 Essential (primary) hypertension: Secondary | ICD-10-CM | POA: Diagnosis not present

## 2016-12-28 NOTE — Patient Instructions (Signed)
Peripheral Vascular Disease Peripheral vascular disease (PVD) is a disease of the blood vessels that are not part of your heart and brain. A simple term for PVD is poor circulation. In most cases, PVD narrows the blood vessels that carry blood from your heart to the rest of your body. This can result in a decreased supply of blood to your arms, legs, and internal organs, like your stomach or kidneys. However, it most often affects a person's lower legs and feet. There are two types of PVD.  Organic PVD. This is the more common type. It is caused by damage to the structure of blood vessels.  Functional PVD. This is caused by conditions that make blood vessels contract and tighten (spasm).  Without treatment, PVD tends to get worse over time. PVD can also lead to acute ischemic limb. This is when an arm or limb suddenly has trouble getting enough blood. This is a medical emergency. Follow these instructions at home:  Take medicines only as told by your doctor.  Do not use any tobacco products, including cigarettes, chewing tobacco, or electronic cigarettes. If you need help quitting, ask your doctor.  Lose weight if you are overweight, and maintain a healthy weight as told by your doctor.  Eat a diet that is low in fat and cholesterol. If you need help, ask your doctor.  Exercise regularly. Ask your doctor for some good activities for you.  Take good care of your feet. ? Wear comfortable shoes that fit well. ? Check your feet often for any cuts or sores. Contact a doctor if:  You have cramps in your legs while walking.  You have leg pain when you are at rest.  You have coldness in a leg or foot.  Your skin changes.  You are unable to get or have an erection (erectile dysfunction).  You have cuts or sores on your feet that are not healing. Get help right away if:  Your arm or leg turns cold and blue.  Your arms or legs become red, warm, swollen, painful, or numb.  You have  chest pain or trouble breathing.  You suddenly have weakness in your face, arm, or leg.  You become very confused or you cannot speak.  You suddenly have a very bad headache.  You suddenly cannot see. This information is not intended to replace advice given to you by your health care provider. Make sure you discuss any questions you have with your health care provider. Document Released: 01/06/2010 Document Revised: 03/19/2016 Document Reviewed: 03/22/2014 Elsevier Interactive Patient Education  2017 [employer]      Chronic Venous Insufficiency Chronic venous insufficiency, also called venous stasis, is a condition that prevents blood from being pumped effectively through the veins in your legs. Blood may no longer be pumped effectively from the legs back to the heart. This condition can range from mild to severe. With proper treatment, you should be able to continue with an active life. What are the causes? Chronic venous insufficiency occurs when the vein walls become stretched, weakened, or damaged, or when valves within the vein are damaged. Some common causes of this include:  High blood pressure inside the veins (venous hypertension).  Increased blood pressure in the leg veins from long periods of sitting or standing.  A blood clot that blocks blood flow in a vein (deep vein thrombosis, DVT).  Inflammation of a vein (phlebitis) that causes a blood clot to form.  Tumors in the pelvis that cause blood   to back up.  What increases the risk? The following factors may make you more likely to develop this condition:  Having a family history of this condition.  Obesity.  Pregnancy.  Living without enough physical activity or exercise (sedentary lifestyle).  Smoking.  Having a job that requires long periods of standing or sitting in one place.  Being a certain age. Women in their 40s and 50s and men in their 70s are more likely to develop this condition.  What are  the signs or symptoms? Symptoms of this condition include:  Veins that are enlarged, bulging, or twisted (varicose veins).  Skin breakdown or ulcers.  Reddened or discolored skin on the front of the leg.  Brown, smooth, tight, and painful skin just above the ankle, usually on the inside of the leg (lipodermatosclerosis).  Swelling.  How is this diagnosed? This condition may be diagnosed based on:  Your medical history.  A physical exam.  Tests, such as: ? A procedure that creates an image of a blood vessel and nearby organs and provides information about blood flow through the blood vessel (duplex ultrasound). ? A procedure that tests blood flow (plethysmography). ? A procedure to look at the veins using X-ray and dye (venogram).  How is this treated? The goals of treatment are to help you return to an active life and to minimize pain or disability. Treatment depends on the severity of your condition, and it may include:  Wearing compression stockings. These can help relieve symptoms and help prevent your condition from getting worse. However, they do not cure the condition.  Sclerotherapy. This is a procedure involving an injection of a material that "dissolves" damaged veins.  Surgery. This may involve: ? Removing a diseased vein (vein stripping). ? Cutting off blood flow through the vein (laser ablation surgery). ? Repairing a valve.  Follow these instructions at home:  Wear compression stockings as told by your health care provider. These stockings help to prevent blood clots and reduce swelling in your legs.  Take over-the-counter and prescription medicines only as told by your health care provider.  Stay active by exercising, walking, or doing different activities. Ask your health care provider what activities are safe for you and how much exercise you need.  Drink enough fluid to keep your urine clear or pale yellow.  Do not use any products that contain nicotine  or tobacco, such as cigarettes and e-cigarettes. If you need help quitting, ask your health care provider.  Keep all follow-up visits as told by your health care provider. This is important. Contact a health care provider if:  You have redness, swelling, or more pain in the affected area.  You see a red streak or line that extends up or down from the affected area.  You have skin breakdown or a loss of skin in the affected area, even if the breakdown is small.  You get an injury in the affected area. Get help right away if:  You get an injury and an open wound in the affected area.  You have severe pain that does not get better with medicine.  You have sudden numbness or weakness in the foot or ankle below the affected area, or you have trouble moving your foot or ankle.  You have a fever and you have worse or persistent symptoms.  You have chest pain.  You have shortness of breath. Summary  Chronic venous insufficiency, also called venous stasis, is a condition that prevents blood from being   pumped effectively through the veins in your legs.  Chronic venous insufficiency occurs when the vein walls become stretched, weakened, or damaged, or when valves within the vein are damaged.  Treatment for this condition depends on how severe your condition is, and it may involve wearing compression stockings or having a procedure.  Make sure you stay active by exercising, walking, or doing different activities. Ask your health care provider what activities are safe for you and how much exercise you need. This information is not intended to replace advice given to you by your health care provider. Make sure you discuss any questions you have with your health care provider. Document Released: 02/15/2007 Document Revised: 08/31/2016 Document Reviewed: 08/31/2016 Elsevier Interactive Patient Education  2017 Elsevier Inc.  

## 2016-12-28 NOTE — Progress Notes (Signed)
[facility] [place]   CC: Follow up peripheral artery occlusive disease  History of Present Illness [name] [name] is a 76 y.o. male patient of Dr. Imogene Burnhen who presents  For follow up of mild chronic venous insufficiency and mild PAD.  Pt is a known diabetic that was found on routine examination by his PCP to have decreased pulses in his L leg. The patient denies any intermittent claudication or rest pain though he notes his ambulation is limited. His primary complaint was right leg swelling with extended standing or ambulation. The patient had bilateral GSV harvested for his 6 vessel CABG in 1998.  The patient has had no history of DVT, known history of varicose vein, no history of venous stasis ulcers, no history oflymphedema and known history of skin changes in lower legs. There is no family history of venous disorders. Dr. Imogene Burnhen advised compressive therapy for pt's chromic venous insufficiency and pt has been wearing knee high graduated compression hose with excellent results.  He and his wife exercise at a gym 5 days/week, he also stays active at his home.  The patient denies New Medical or Surgical History.  Pt Diabetic: Yes, does not know what his last A1C was, seems to be in good control  Pt smoker: former smoker, quit in 1964  Pt meds include: Statin :Yes Betablocker: Yes ASA: Yes Other anticoagulants/antiplatelets: no     Past Medical History:  Diagnosis Date  . Benign prostatic hypertrophy with lower urinary tract symptoms (LUTS)   . Bilateral edema of lower extremity    IMPROVED W/ TED HOSE  . CAD (coronary artery disease)    cardiologist-  dr Verdis Primehenry smith--  s/p  cabg x6 in 1998  . Heart murmur   . Hiatal hernia   . History of kidney stones   . Hydronephrosis, left   . Hyperlipidemia   . Hypertension   . Hypothyroidism   . Left ureteral stone   . Nephrolithiasis    right side per ct  non-obstructive  . RBBB (right bundle branch block)    . 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 Sigmoid diverticulosis   . Sinus bradycardia   . Type 2 diabetes mellitus (HCC)     Social History Social History  Substance Use Topics  . Smoking status: Former Smoker    Years: 10.00    Types: Cigarettes    Quit date: 10/26/1966  . Smokeless tobacco: Never Used  . Alcohol use No    Family History Family History  Problem Relation Age of Onset  . Tuberculosis Mother   . CAD Father     Past Surgical History:  Procedure Laterality Date  . CATARACT EXTRACTION W/ INTRAOCULAR LENS  IMPLANT, BILATERAL  2011  . CORONARY ARTERY BYPASS GRAFT  1998   CABG X 6 --   LIMA  to the LAD and  other undefined SVG's  . CYSTOSCOPY WITH BIOPSY N/A 11/29/2015   Procedure: CYSTOSCOPY WITH BIOPSY AND FULGERATION;  Surgeon: Sebastian Acheheodore Manny, MD;  [facility];  Service: Urology;  Laterality: N/A;  . CYSTOSCOPY/RETROGRADE/URETEROSCOPY/STONE EXTRACTION WITH BASKET Left 11/29/2015   Procedure: CYSTOSCOPY/RETROGRADE/DIAGNOSTIC URETEROSCOPY/STONE EXTRACTION;  Surgeon: Sebastian Acheheodore Manny, MD;  [facility];  Service: Urology;  Laterality: Left;  . EXTRACORPOREAL SHOCK WAVE LITHOTRIPSY Left 08-12-2015  . LEFT HEART CATHETERIZATION WITH CORONARY/GRAFT ANGIOGRAM  06/16/2013   Procedure: LEFT HEART CATHETERIZATION WITH Isabel CapriceORONARY/GRAFT ANGIOGRAM;  Surgeon: Lesleigh NoeHenry W Smith III, MD;  [facility];  Service: Cardiovascular; Native vessel occlusive disease w/ ostial total occlusion RCA, dLM, & heavy calicification LM LAD CX RCA/  SVG to diagonal patent, SVG to marginal 1 patent & 2 occluded, LIMA to dLAD patent/ dCFX collateral from obtuse marginal graft/  normal LVF  . TRANSTHORACIC ECHOCARDIOGRAM  05-23-2015  dr Sherilyn Cooter smith   mild LVH,  grade 1 diastolic dysfunction,  ef 55-060%,  mild AV calcifacation without stenosis,  mild AR and MR,  moderate LAE,  trivial PR and TR,  mild increase pulmonary pressure    Allergies  Allergen Reactions  .  Penicillins Rash    Has patient had a PCN reaction causing immediate rash, facial/tongue/throat swelling, SOB or lightheadedness with hypotension:no Has patient had a PCN reaction causing severe rash involving mucus membranes or skin necrosis: no Has patient had a PCN reaction that required hospitalization no Has patient had a PCN reaction occurring within the last 10 years: no If all of the above answers are "NO", then may proceed with Cephalosporin use.     Current Outpatient Prescriptions  Medication Sig Dispense Refill  . amLODipine (NORVASC) 5 MG tablet TAKE 1 TABLET BY MOUTH EVERY DAY (Patient taking differently: TAKE 5 MG  BY MOUTH EVERY MORNING) 90 tablet 0  . aspirin EC 325 MG tablet Take 325 mg by mouth daily.    Marland Kitchen atorvastatin (LIPITOR) 80 MG tablet Take 40 mg by mouth every morning.     . finasteride (PROSCAR) 5 MG tablet Take 5 mg by mouth every morning.     . Flaxseed, Linseed, (FLAX SEEDS PO) Take 1 capsule by mouth daily.    . hydrochlorothiazide (HYDRODIURIL) 25 MG tablet Take 25 mg by mouth every morning.     Marland Kitchen levothyroxine (SYNTHROID, LEVOTHROID) 88 MCG tablet Take 88 mcg by mouth daily before breakfast.    . lisinopril (PRINIVIL,ZESTRIL) 40 MG tablet Take 40 mg by mouth every morning.     . metFORMIN (GLUCOPHAGE) 500 MG tablet Take 500 mg by mouth 2 (two) times daily with a meal.    . metoprolol succinate (TOPROL-XL) 50 MG 24 hr tablet Take 50 mg by mouth every morning. Take with or immediately following a meal.    . nitroGLYCERIN (NITROSTAT) 0.4 MG SL tablet Place 1 tablet (0.4 mg total) under the tongue every 5 (five) minutes as needed for chest pain. 25 tablet 1  . potassium chloride SA (K-DUR,KLOR-CON) 20 MEQ tablet Take 20 mEq by mouth every morning.     . senna-docusate (SENOKOT-S) 8.6-50 MG tablet Take 1 tablet by mouth 2 (two) times daily. While taking pain meds to prevent constipation 30 tablet 0  . tamsulosin (FLOMAX) 0.4 MG CAPS capsule Take 0.4 mg by mouth  daily.     No current facility-administered medications for this visit.     ROS: See HPI for pertinent positives and negatives.   Physical Examination  Vitals:   12/28/16 0921 12/28/16 0926  BP: (!) 150/80 (!) 156/82  Pulse: (!) 55 60  Resp: 20   Temp: 97.6 F (36.4 C)   TempSrc: Oral   SpO2: 98%   Weight: 203 lb (92.1 kg)   Height: 5\' 5"  (1.651 m)    Body mass index is 33.78 kg/m.  General: A&O x 3, WDWN, obese male  Pulmonary: Sym exp, respirations are non labored, good air movt, CTAB, no rales, rhonchi, or wheezing  Cardiac: RRR, Nl S1, S2, no detected murmur  Vascular: Vessel Right Left  Radial Palpable Palpable  Brachial Palpable Palpable  Carotid Palpable, with bruit Palpable, without bruit  Aorta Not palpable N/A  Femoral Palpable Palpable  Popliteal Not palpable Not palpable  PT  not Palpable Faintly Palpable  DP Faintly Palpable Faintly Palpable   Gastrointestinal: soft, NTND, -G/R, - HSM, - palpable masses, - CVAT B, unable to palpate aorta due to pannus  Musculoskeletal: M/S 5/5 throughout , Extremities without ischemic changes,  B healed GSV harvest incisions, R LDS, L spider and varicose veins. Trace pitting edema in right ankle, no edema in left ankle     ASSESSMENT: [name] [name] is a 76 y.o. male  who presents with: B leg chronic venous insufficiency (C4), non-hemodynamically significant calcific peripheral arterial disease c/w diabetic complications.   He no longer has swelling in either lower leg since he has been wearing his knee high graduated compression hose during the day. He is more active, has no claudication sx's, exercises regularly, has no signs of ischemia in his feet/legs, has no chronic venous stasis dermatitis.    He has a right carotid bruit with no history of stroke or TIA; see Plan.   DATA 12-28-16 Bilateral ABI's remain supra systolic and non compressible  which  indicates medial calcification.  Normal bilateral tibial artery Doppler waveforms and toe brachial indices. No significant change compared to the last ABI exam on 12-27-15.   BLE Venous Insufficiency Duplex (Date: 12/21/2014):   RLE: No DVT and SVT, No GSV: harvested, +deep venous reflux: PTV, incompetent calf perforator  LLE: No DVT and SVT, No GSV: harvested, +deep venous reflux: SFJ, incompetent calf perforator    PLAN:  Based on the patient's vascular studies and examination, pt will return to clinic in 1 year with ABI's and carotid duplex.   I discussed in depth with the patient the nature of atherosclerosis, and emphasized the importance of maximal medical management including strict control of blood pressure, blood glucose, and lipid levels, obtaining regular exercise, and continued cessation of smoking.  The patient is aware that without maximal medical management the underlying atherosclerotic disease process will progress, limiting the benefit of any interventions.  The patient was given information about PAD including signs, symptoms, treatment, what symptoms should prompt the patient to seek immediate medical care, and risk reduction measures to take.  Charisse March, RN, MSN, FNP-C [facility] Phone: [phone]  Clinic MD: Randie Heinz on call  12/28/16 9:32 AM

## 2017-01-04 NOTE — Addendum Note (Signed)
Addended by: Burton ApleyPETTY, Liani Caris A on: 01/04/2017 09:24 AM   Modules accepted: Orders

## 2017-02-01 DIAGNOSIS — R3912 Poor urinary stream: Secondary | ICD-10-CM | POA: Diagnosis not present

## 2017-02-01 DIAGNOSIS — N401 Enlarged prostate with lower urinary tract symptoms: Secondary | ICD-10-CM | POA: Diagnosis not present

## 2017-02-01 DIAGNOSIS — N200 Calculus of kidney: Secondary | ICD-10-CM | POA: Diagnosis not present

## 2017-03-31 DIAGNOSIS — I739 Peripheral vascular disease, unspecified: Secondary | ICD-10-CM | POA: Diagnosis not present

## 2017-03-31 DIAGNOSIS — Z1389 Encounter for screening for other disorder: Secondary | ICD-10-CM | POA: Diagnosis not present

## 2017-03-31 DIAGNOSIS — I2510 Atherosclerotic heart disease of native coronary artery without angina pectoris: Secondary | ICD-10-CM | POA: Diagnosis not present

## 2017-03-31 DIAGNOSIS — E1151 Type 2 diabetes mellitus with diabetic peripheral angiopathy without gangrene: Secondary | ICD-10-CM | POA: Diagnosis not present

## 2017-03-31 DIAGNOSIS — E039 Hypothyroidism, unspecified: Secondary | ICD-10-CM | POA: Diagnosis not present

## 2017-03-31 DIAGNOSIS — E7800 Pure hypercholesterolemia, unspecified: Secondary | ICD-10-CM | POA: Diagnosis not present

## 2017-03-31 DIAGNOSIS — Z0000 Encounter for general adult medical examination without abnormal findings: Secondary | ICD-10-CM | POA: Diagnosis not present

## 2017-03-31 DIAGNOSIS — I10 Essential (primary) hypertension: Secondary | ICD-10-CM | POA: Diagnosis not present

## 2017-04-05 DIAGNOSIS — E039 Hypothyroidism, unspecified: Secondary | ICD-10-CM | POA: Diagnosis not present

## 2017-04-27 DIAGNOSIS — H04121 Dry eye syndrome of right lacrimal gland: Secondary | ICD-10-CM | POA: Diagnosis not present

## 2017-04-27 DIAGNOSIS — Z961 Presence of intraocular lens: Secondary | ICD-10-CM | POA: Diagnosis not present

## 2017-04-27 DIAGNOSIS — H04122 Dry eye syndrome of left lacrimal gland: Secondary | ICD-10-CM | POA: Diagnosis not present

## 2017-04-27 DIAGNOSIS — H18413 Arcus senilis, bilateral: Secondary | ICD-10-CM | POA: Diagnosis not present

## 2017-05-20 DIAGNOSIS — E039 Hypothyroidism, unspecified: Secondary | ICD-10-CM | POA: Diagnosis not present

## 2017-06-01 ENCOUNTER — Ambulatory Visit (INDEPENDENT_AMBULATORY_CARE_PROVIDER_SITE_OTHER): Payer: PPO | Admitting: Interventional Cardiology

## 2017-06-01 ENCOUNTER — Encounter: Payer: Self-pay | Admitting: Interventional Cardiology

## 2017-06-01 VITALS — BP 164/74 | HR 50 | Ht 65.0 in | Wt 205.6 lb

## 2017-06-01 DIAGNOSIS — I10 Essential (primary) hypertension: Secondary | ICD-10-CM

## 2017-06-01 DIAGNOSIS — I4510 Unspecified right bundle-branch block: Secondary | ICD-10-CM

## 2017-06-01 DIAGNOSIS — I208 Other forms of angina pectoris: Secondary | ICD-10-CM | POA: Diagnosis not present

## 2017-06-01 DIAGNOSIS — E7800 Pure hypercholesterolemia, unspecified: Secondary | ICD-10-CM

## 2017-06-01 DIAGNOSIS — I25812 Atherosclerosis of bypass graft of coronary artery of transplanted heart without angina pectoris: Secondary | ICD-10-CM

## 2017-06-01 MED ORDER — SPIRONOLACTONE 25 MG PO TABS: 12.5000 mg | ORAL_TABLET | Freq: Every day | ORAL | 3 refills | Status: DC

## 2017-06-01 MED ORDER — ASPIRIN EC 81 MG PO TBEC: 81.0000 mg | DELAYED_RELEASE_TABLET | Freq: Every day | ORAL | 3 refills | Status: DC

## 2017-06-01 NOTE — Progress Notes (Signed)
Cardiology Office Note    Date:  06/01/2017   ID:  GAR GLANCE, DOB 08-Jan-1941, MRN [id number]  PCP:  Renford Dills, MD  Cardiologist: Lesleigh Noe, MD   Chief Complaint  Patient presents with  . Coronary Artery Disease  . Congestive Heart Failure    History of Present Illness:  [name] [name] is a 76 y.o. male for follow-up of CAD, history of coronary artery bypass grafting with angina pectoris, type 2 diabetes, peripheral arterial disease, essential hypertension, and diabetes mellitus  [name] [name] is doing well. He has stable angina that is most bothersome in the winter when he is active on cold days. Nitroglycerin relieves the discomfort. He has not had nitroglycerin since the winter time.  Lower extremity swelling is been treated with compression stockings with significant improvement. He denies orthopnea, PND, and dyspnea on exertion.   Past Medical History:  Diagnosis Date  . Benign prostatic hypertrophy with lower urinary tract symptoms (LUTS)   . Bilateral edema of lower extremity    IMPROVED W/ TED HOSE  . CAD (coronary artery disease)    cardiologist-  dr [name] [name]--  s/p  cabg x6 in 1998  . Heart murmur   . Hiatal hernia   . History of kidney stones   . Hydronephrosis, left   . Hyperlipidemia   . Hypertension   . Hypothyroidism   . Left ureteral stone   . Nephrolithiasis    right side per ct  non-obstructive  . RBBB (right bundle branch block)   . 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 Sigmoid diverticulosis   . Sinus bradycardia   . Type 2 diabetes mellitus (HCC)     Past Surgical History:  Procedure Laterality Date  . CATARACT EXTRACTION W/ INTRAOCULAR LENS  IMPLANT, BILATERAL  2011  . CORONARY ARTERY BYPASS GRAFT  1998   CABG X 6 --   LIMA  to the LAD and  other undefined SVG's  . CYSTOSCOPY WITH BIOPSY N/A 11/29/2015   Procedure: CYSTOSCOPY WITH BIOPSY AND FULGERATION;  Surgeon: [name] Ache, MD;  Location: [facility];  Service: Urology;   Laterality: N/A;  . CYSTOSCOPY/RETROGRADE/URETEROSCOPY/STONE EXTRACTION WITH BASKET Left 11/29/2015   Procedure: CYSTOSCOPY/RETROGRADE/DIAGNOSTIC URETEROSCOPY/STONE EXTRACTION;  Surgeon: [name] Ache, MD;  [facility];  Service: Urology;  Laterality: Left;  . EXTRACORPOREAL SHOCK WAVE LITHOTRIPSY Left 08-12-2015  . LEFT HEART CATHETERIZATION WITH CORONARY/GRAFT ANGIOGRAM  06/16/2013   Procedure: LEFT HEART CATHETERIZATION WITH Isabel Caprice;  Surgeon: Lesleigh Noe, MD;  Location: [facility] CATH LAB;  Service: Cardiovascular; Native vessel occlusive disease w/ ostial total occlusion RCA, dLM, & heavy calicification LM LAD CX RCA/  SVG to diagonal patent, SVG to marginal 1 patent & 2 occluded, LIMA to dLAD patent/ dCFX collateral from obtuse marginal graft/  normal LVF  . TRANSTHORACIC ECHOCARDIOGRAM  05-23-2015  dr [name] [name]   mild LVH,  grade 1 diastolic dysfunction,  ef 55-060%,  mild AV calcifacation without stenosis,  mild AR and MR,  moderate LAE,  trivial PR and TR,  mild increase pulmonary pressure    Current Medications: Outpatient Medications Prior to Visit  Medication Sig Dispense Refill  . aspirin EC 325 MG tablet Take 325 mg by mouth daily.    [name] [name] atorvastatin (LIPITOR) 80 MG tablet Take 40 mg by mouth every morning.     . finasteride (PROSCAR) 5 MG tablet Take 5 mg by mouth every morning.     [name] [name]  Flaxseed, Linseed, (FLAX SEEDS PO) Take 1 capsule by mouth daily.    . hydrochlorothiazide (HYDRODIURIL) 25 MG tablet Take 25 mg by mouth every morning.     [name] [name] lisinopril (PRINIVIL,ZESTRIL) 40 MG tablet Take 40 mg by mouth every morning.     . metFORMIN (GLUCOPHAGE) 500 MG tablet Take 500 mg by mouth 2 (two) times daily with a meal.    . metoprolol succinate (TOPROL-XL) 50 MG 24 hr tablet Take 50 mg by mouth every morning. Take with or immediately following a meal.    . nitroGLYCERIN (NITROSTAT) 0.4 MG SL tablet Place 1 tablet (0.4 mg total) under the tongue  every 5 (five) minutes as needed for chest pain. 25 tablet 1  . potassium chloride SA (K-DUR,KLOR-CON) 20 MEQ tablet Take 20 mEq by mouth every morning.     . senna-docusate (SENOKOT-S) 8.6-50 MG tablet Take 1 tablet by mouth 2 (two) times daily. While taking pain meds to prevent constipation 30 tablet 0  . tamsulosin (FLOMAX) 0.4 MG CAPS capsule Take 0.4 mg by mouth daily.    [name] [name] amLODipine (NORVASC) 5 MG tablet TAKE 1 TABLET BY MOUTH EVERY DAY (Patient not taking: Reported on 06/01/2017) 90 tablet 0  . levothyroxine (SYNTHROID, LEVOTHROID) 88 MCG tablet Take 88 mcg by mouth daily before breakfast.     No facility-administered medications prior to visit.      Allergies:   Penicillins   Social History   Social History  . Marital status: Married    Spouse name: N/A  . Number of children: N/A  . Years of education: N/A   Social History Main Topics  . Smoking status: Former Smoker    Years: 10.00    Types: Cigarettes    Quit date: 10/26/1966  . Smokeless tobacco: Never Used  . Alcohol use No  . Drug use: No  . Sexual activity: Not Asked   Other Topics Concern  . None   Social History Narrative  . None     Family History:  The patient's family history includes CAD in his father; Tuberculosis in his mother.   ROS:   Please see the history of present illness.    Snoring is an issue. He denies excessive daytime sleepiness. Wife has not observed apnea. He does not have excessive daytime sleepiness.  All other systems reviewed and are negative.   PHYSICAL EXAM:   VS:  BP (!) 164/74 (BP Location: Left Arm)   Pulse (!) 50   Ht 5\' 5"  (1.651 m)   Wt 205 lb 9.6 oz (93.3 kg)   BMI 34.21 kg/m    GEN: Well nourished, well developed, in no acute distress  HEENT: normal  Neck: no JVD, carotid bruits, or masses Cardiac: RRR; no murmurs, rubs, or gallops,no edema  Respiratory:  clear to auscultation bilaterally, normal work of breathing GI: soft, nontender, nondistended, + BS MS: no  deformity or atrophy  Skin: warm and dry, no rash Neuro:  Alert and Oriented x 3, Strength and sensation are intact Psych: euthymic mood, full affect  Wt Readings from Last 3 Encounters:  06/01/17 205 lb 9.6 oz (93.3 kg)  12/28/16 203 lb (92.1 kg)  06/19/16 205 lb (93 kg)      Studies/Labs Reviewed:   EKG:  EKG  Sinus bradycardia, right bundle branch block with old inferior Q waves. When compared to the prior tracing, no significant changes noted.  Recent Labs: No results found for requested labs within last 8760 hours.   Lipid  Panel No results found for: CHOL, TRIG, HDL, CHOLHDL, VLDL, LDLCALC, LDLDIRECT  Additional studies/ records that were reviewed today include:  No new data    ASSESSMENT:    1. Coronary artery disease involving bypass graft of transplanted heart without angina pectoris   2. Pure hypercholesterolemia   3. Exertional angina (HCC)   4. Essential hypertension, benign   5. Right bundle branch block      PLAN:  In order of problems listed above:  1. Stable angina pectoris. Last LDL cholesterol was 81 in June. Target is 70. 2. LDL is near target. Was 3681 in June. 3. Stable 4. Elevated. Target should be 130/90 mmHg are less. Significantly reduce salt intake to 2000 mg per day. Weight loss as discussed. Aldactone 12.5 mg per day as prescribed. Blood pressure clinic follow-up and basic metabolic panel on the same day. 5. Sinus bradycardia with right bundle branch block. Heart rate is slower than on prior EKG. No changes recommended today.  Clinical follow-up in one year. Decrease salt in diet and add low-dose Aldactone. Basic metabolic panel and blood pressure clinic follow-up in 3-4 weeks. Did not increase metoprolol dose because of resting heart rate of 50.  Medication Adjustments/Labs and Tests Ordered: Current medicines are reviewed at length with the patient today.  Concerns regarding medicines are outlined above.  Medication changes, Labs and Tests  ordered today are listed in the Patient Instructions below. There are no Patient Instructions on file for this visit.   Signed, Lesleigh NoeHenry W Moise Friday III, MD  06/01/2017 11:25 AM    [facility] [address], [place], [place]  [address] Phone: [phone]; Fax: [phone]) [phone]

## 2017-06-01 NOTE — Patient Instructions (Signed)
Medication Instructions:  1) START Spironolactone 12.5mg  once daily 2) DECREASE Aspirin to 81mg  once daily.  Labwork: Your physician recommends that you return for lab work in: 3-4 weeks at time of Hypertension Clinic appointment (BMET)   Testing/Procedures: None  Follow-Up: Your physician recommends that you schedule a follow-up appointment in: 3-4 weeks with Hypertension Clinic.  Your physician wants you to follow-up in: 1 year with Dr. Katrinka BlazingSmith.  You will receive a reminder letter in the mail two months in advance. If you don't receive a letter, please call our office to schedule the follow-up appointment.    Any Other Special Instructions Will Be Listed Below (If Applicable).     If you need a refill on your cardiac medications before your next appointment, please call your pharmacy.

## 2017-06-14 DIAGNOSIS — Z7984 Long term (current) use of oral hypoglycemic drugs: Secondary | ICD-10-CM | POA: Diagnosis not present

## 2017-06-14 DIAGNOSIS — E119 Type 2 diabetes mellitus without complications: Secondary | ICD-10-CM | POA: Diagnosis not present

## 2017-06-26 ENCOUNTER — Other Ambulatory Visit: Payer: Self-pay | Admitting: Interventional Cardiology

## 2017-06-26 DIAGNOSIS — I2510 Atherosclerotic heart disease of native coronary artery without angina pectoris: Secondary | ICD-10-CM

## 2017-06-28 NOTE — Progress Notes (Signed)
Patient ID: DYLYN [name]                 DOB: 1940-11-29                      MRN: [id number]     HPI: [name] [name] is a 76 y.o. male patient of Dr. Katrinka Blazing  who presents today for hypertension evaluation.  PMH includes CAD, history of coronary artery bypass grafting with angina pectoris, type 2 diabetes, peripheral arterial disease, essential hypertension, and diabetes mellitus. At his most recent appt with Dr. Katrinka Blazing, he was started on spironolactone 12.5mg  daily.   He presents today with his wife. He reports that he has not noticed any side effects with the new medication. He denies dizziness, SOB, chest pain. He and his wife state that his blood pressure has been better than before, but still running high at home. They report systolic BP in 150s. They endorse using a wrist cuff that is older.   Pt reports he has been taking of potassium rather than as the tablet is difficult to swallow.   Current HTN meds:  Spironolactone 12.5mg  daily Amlodipine 5mg  daily  Hydrochlorothiazide 25mg  daily  Lisinopril 40mg  daily Metoprolol succinate 50mg  daily   Previously tried: he does not recall trying any additional medications or these medications at higher doses.   BP goal: <130/90 per Dr. Michaelle Copas note on 06/01/17  Family History: CAD in his father  Social History: Former smoker - Quit in [place]. Denies other tobacco products and alcohol.   Diet: Most meals prepared from home. Has cut salt out since last visit. Drinks 6-7 cups of coffee per day throughout the day. Drinks pepsi 2 cans per day. Rarely has tea.   Exercise: Go to gym - [employer]. He generally does treadmill and bike usually 1 hour per day for 5 days per week.   Home BP readings: Wrist cuff - fairly old. Has not monitored in the last 2 weeks.   Wt Readings from Last 3 Encounters:  06/01/17 205 lb 9.6 oz (93.3 kg)  12/28/16 203 lb (92.1 kg)  06/19/16 205 lb (93 kg)   BP Readings from Last 3 Encounters:  06/29/17  130/68  06/01/17 (!) 164/74  12/28/16 (!) 156/82   Pulse Readings from Last 3 Encounters:  06/29/17 (!) 46  06/01/17 (!) 50  12/28/16 60    Renal function: CrCl cannot be calculated (Unknown ideal weight.).  Past Medical History:  Diagnosis Date  . Benign prostatic hypertrophy with lower urinary tract symptoms (LUTS)   . Bilateral edema of lower extremity    IMPROVED W/ TED HOSE  . CAD (coronary artery disease)    cardiologist-  dr Verdis Prime--  s/p  cabg x6 in 1998  . Heart murmur   . Hiatal hernia   . History of kidney stones   . Hydronephrosis, left   . Hyperlipidemia   . Hypertension   . Hypothyroidism   . Left ureteral stone   . Nephrolithiasis    right side per ct  non-obstructive  . RBBB (right bundle branch block)   . 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 Sigmoid diverticulosis   . Sinus bradycardia   . Type 2 diabetes mellitus (HCC)     Current Outpatient Prescriptions on File Prior to Visit  Medication Sig Dispense Refill  . amLODipine (NORVASC) 5 MG tablet Take 5 mg by mouth daily.    Marland Kitchen aspirin  EC 81 MG tablet Take 1 tablet (81 mg total) by mouth daily. 90 tablet 3  . atorvastatin (LIPITOR) 80 MG tablet Take 40 mg by mouth every morning.     . finasteride (PROSCAR) 5 MG tablet Take 5 mg by mouth every morning.     . Flaxseed, Linseed, (FLAX SEEDS PO) Take 1 capsule by mouth daily.    . hydrochlorothiazide (HYDRODIURIL) 25 MG tablet Take 25 mg by mouth every morning.     Marland Kitchen. levothyroxine (SYNTHROID, LEVOTHROID) 100 MCG tablet Take 100 mcg by mouth daily before breakfast.    . lisinopril (PRINIVIL,ZESTRIL) 40 MG tablet Take 40 mg by mouth every morning.     . metFORMIN (GLUCOPHAGE) 500 MG tablet Take 500 mg by mouth 2 (two) times daily with a meal.    . metoprolol succinate (TOPROL-XL) 50 MG 24 hr tablet Take 50 mg by mouth every morning. Take with or immediately following a meal.    . potassium chloride SA (K-DUR,KLOR-CON) 20 MEQ tablet Take 10 mEq by mouth every  morning.     . senna-docusate (SENOKOT-S) 8.6-50 MG tablet Take 1 tablet by mouth 2 (two) times daily. While taking pain meds to prevent constipation 30 tablet 0  . spironolactone (ALDACTONE) 25 MG tablet Take 0.5 tablets (12.5 mg total) by mouth daily. 45 tablet 3  . tamsulosin (FLOMAX) 0.4 MG CAPS capsule Take 0.4 mg by mouth daily.    . nitroGLYCERIN (NITROSTAT) 0.4 MG SL tablet DISSOLVE ONE TABLET UNDER THE TONGUE EVERY 5 MINUTES AS NEEDED FOR CHEST PAIN 25 tablet 5   No current facility-administered medications on file prior to visit.     Allergies  Allergen Reactions  . Penicillins Rash    Has patient had a PCN reaction causing immediate rash, facial/tongue/throat swelling, SOB or lightheadedness with hypotension:no Has patient had a PCN reaction causing severe rash involving mucus membranes or skin necrosis: no Has patient had a PCN reaction that required hospitalization no Has patient had a PCN reaction occurring within the last 10 years: no If all of the above answers are "NO", then may proceed with Cephalosporin use.     Blood pressure 130/68, pulse (!) 46.   Assessment/Plan: Hypertension: BMET today after starting spironolactone. BP is borderline controlled. Will plan to continue current regimen and follow up in 4 weeks. Have asked pt to bring log and cuff with him to next visit for verification.   BMET returned WNL. Continue as above.    Thank you, Freddie ApleyKelley M. Cleatis PolkaAuten, PharmD  [facility]  06/29/2017 9:48 PM

## 2017-06-29 ENCOUNTER — Encounter: Payer: Self-pay | Admitting: Pharmacist

## 2017-06-29 ENCOUNTER — Ambulatory Visit (INDEPENDENT_AMBULATORY_CARE_PROVIDER_SITE_OTHER): Payer: PPO | Admitting: Pharmacist

## 2017-06-29 ENCOUNTER — Other Ambulatory Visit: Payer: PPO | Admitting: *Deleted

## 2017-06-29 VITALS — BP 130/68 | HR 46

## 2017-06-29 DIAGNOSIS — I10 Essential (primary) hypertension: Secondary | ICD-10-CM

## 2017-06-29 DIAGNOSIS — I25812 Atherosclerosis of bypass graft of coronary artery of transplanted heart without angina pectoris: Secondary | ICD-10-CM

## 2017-06-29 LAB — BASIC METABOLIC PANEL
BUN / CREAT RATIO: 20 (ref 10–24)
BUN: 17 mg/dL (ref 8–27)
CALCIUM: 9.7 mg/dL (ref 8.6–10.2)
CO2: 23 mmol/L (ref 20–29)
CREATININE: 0.86 mg/dL (ref 0.76–1.27)
Chloride: 101 mmol/L (ref 96–106)
GFR calc non Af Amer: 84 mL/min/{1.73_m2} (ref >59)
GFR, EST AFRICAN AMERICAN: 97 mL/min/{1.73_m2} (ref >59)
GLUCOSE: 94 mg/dL (ref 65–99)
POTASSIUM: 4.9 mmol/L (ref 3.5–5.2)
SODIUM: 141 mmol/L (ref 134–144)

## 2017-06-29 NOTE — Patient Instructions (Addendum)
Return for a follow up appointment in 4 weeks  Check your blood pressure at home daily (if able) and keep record of the readings.  Take your BP meds as follows: CONTINUE all the same  - we will call you with results of blood work  Try to cut down on coffee intake.   Bring all of your meds, your BP cuff and your record of home blood pressures to your next appointment.  Exercise as you're able, try to walk approximately 30 minutes per day.  Keep salt intake to a minimum, especially watch canned and prepared boxed foods.  Eat more fresh fruits and vegetables and fewer canned items.  Avoid eating in fast food restaurants.    HOW TO TAKE YOUR BLOOD PRESSURE: . Rest 5 minutes before taking your blood pressure. .  Don't smoke or drink caffeinated beverages for at least 30 minutes before. . Take your blood pressure before (not after) you eat. . Sit comfortably with your back supported and both feet on the floor (don't cross your legs). . Elevate your arm to heart level on a table or a desk. . Use the proper sized cuff. It should fit smoothly and snugly around your bare upper arm. There should be enough room to slip a fingertip under the cuff. The bottom edge of the cuff should be 1 inch above the crease of the elbow. . Ideally, take 3 measurements at one sitting and record the average.

## 2017-07-15 ENCOUNTER — Telehealth: Payer: Self-pay | Admitting: Interventional Cardiology

## 2017-07-15 DIAGNOSIS — T7840XA Allergy, unspecified, initial encounter: Secondary | ICD-10-CM | POA: Diagnosis not present

## 2017-07-15 DIAGNOSIS — R21 Rash and other nonspecific skin eruption: Secondary | ICD-10-CM | POA: Diagnosis not present

## 2017-07-15 NOTE — Telephone Encounter (Signed)
Has BP and LE swelling improved on spironolactone? Stop spironolactone. Let us know what happens to rash and f/u with PCP.

## 2017-07-15 NOTE — Telephone Encounter (Signed)
Spoke with wife, [name] Lucendia Herrlich),  DPR on file.  Wife states she noticed a rash all over pt's back yesterday and pt was nauseous all day.  Pt went to PCP this morning and advised that they call our office because they believe the Spironolactone is the cause of the rash.  Pt started Spironolactone on 06/01/17.  Advised I would send message to Dr. Katrinka Blazing for review and advisement.

## 2017-07-15 NOTE — Telephone Encounter (Signed)
New Message  Pt c/o medication issue:  1. Name of Medication: Spironolactone  2. How are you currently taking this medication (dosage and times per day)?    3. Are you having a reaction (difficulty breathing--STAT)? Pt allergic  4. What is your medication issue? Pt wife would like to further discuss

## 2017-07-15 NOTE — Telephone Encounter (Signed)
Spoke with wife and advised her of recommendations per Dr. Katrinka Blazing.  Pt will d/c Spironolactone and f/u with PCP.  Wife states LE swelling has improved some with Cleda Daub and compression stockings.  States recent BP's are as follows: 9/4 at HTN clinic- 130/68 110/52 142/55 108/49 94/38 Today at PCP office 126/70 Will route to Dr. Katrinka Blazing.

## 2017-07-19 DIAGNOSIS — R21 Rash and other nonspecific skin eruption: Secondary | ICD-10-CM | POA: Diagnosis not present

## 2017-07-27 ENCOUNTER — Ambulatory Visit (INDEPENDENT_AMBULATORY_CARE_PROVIDER_SITE_OTHER): Payer: PPO | Admitting: Pharmacist

## 2017-07-27 VITALS — BP 120/60

## 2017-07-27 DIAGNOSIS — I10 Essential (primary) hypertension: Secondary | ICD-10-CM | POA: Diagnosis not present

## 2017-07-27 NOTE — Progress Notes (Signed)
Patient ID: [name] [name]                 DOB: [birth year]                      MRN: [id number]     HPI: [name] [name] is a 76 y.o. male patient of Dr. Katrinka Blazing  who presents today for hypertension follow up.  PMH includes CAD, CABG, angina, DM2, PAD, HTN, and DM. At his most recent appt with Dr. Katrinka Blazing, he was started on spironolactone 12.5mg  daily. BP was controlled at f/u appt and pt was continued on his BP regimen. His wife called clinic 2 weeks later with rash on pt's back and nausea. He was advised to stop spironolactone on 9/20 (6 weeks after initiation of therapy).   Today, patient and wife present for follow up. Report that rash has almost completely resolved since stopping spironolactone. Patient has drastically cut down on caffeine intake and is now only drinking 3 cups of coffee/day +/- 1 pepsi/day. Was previously drinking 6-7 cups of coffee/day + 2 pepsis/day. Patient denies syncope, dizziness, chest pain.   Wife brings in home BP cuff (wrist). By its reading, BP is 126/54. Not holding hand at heart level for this measurement as he does not do this at home. Brings in log book of BP readings, much improved from previous.   Current HTN meds:  Amlodipine  daily  Hydrochlorothiazide  daily  Lisinopril  daily Metoprolol succinate  daily   Previously tried: spironolactone - rash on back  BP goal: <130/69mmHg  Family History: CAD in his father  Social History: Former smoker - Quit in [place]. Denies other tobacco products and alcohol.   Diet: Most meals prepared from home. Has cut salt out since last visit. Has cut down on caffeine 3 cups of coffee/day +/- 1 pepsi/day  Exercise: Go to gym - [employer]. He generally does treadmill and bike usually 1 hour per day for 5 days per week.   Home BP readings: Wrist cuff - fairly old. 110s-120s/40s-70s, several DBP < 60 however not holding wrist at heart level.   Wt Readings from Last 3 Encounters:  06/01/17 205 lb 9.6  oz (93.3 kg)  12/28/16 203 lb (92.1 kg)  06/19/16 205 lb (93 kg)   BP Readings from Last 3 Encounters:  07/27/17 120/60  06/29/17 130/68  06/01/17 (!) 164/74   Pulse Readings from Last 3 Encounters:  06/29/17 (!) 46  06/01/17 (!) 50  12/28/16 60    Renal function: CrCl cannot be calculated (Patient's most recent lab result is older than the maximum 21 days allowed.).  Past Medical History:  Diagnosis Date  . Benign prostatic hypertrophy with lower urinary tract symptoms (LUTS)   . Bilateral edema of lower extremity    IMPROVED W/ TED HOSE  . CAD (coronary artery disease)    cardiologist-  dr Verdis Prime--  s/p  cabg x6 in 1998  . Heart murmur   . Hiatal hernia   . History of kidney stones   . Hydronephrosis, left   . Hyperlipidemia   . Hypertension   . Hypothyroidism   . Left ureteral stone   . Nephrolithiasis    right side per ct  non-obstructive  . RBBB (right bundle branch block)   . 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 Sigmoid diverticulosis   . Sinus bradycardia   . Type 2 diabetes mellitus (HCC)  Current Outpatient Prescriptions on File Prior to Visit  Medication Sig Dispense Refill  . amLODipine (NORVASC) 5 MG tablet Take 5 mg by mouth daily.    Marland Kitchen aspirin EC 81 MG tablet Take 1 tablet (81 mg total) by mouth daily. 90 tablet 3  . atorvastatin (LIPITOR) 80 MG tablet Take 40 mg by mouth every morning.     . finasteride (PROSCAR) 5 MG tablet Take 5 mg by mouth every morning.     . Flaxseed, Linseed, (FLAX SEEDS PO) Take 1 capsule by mouth daily.    . hydrochlorothiazide (HYDRODIURIL) 25 MG tablet Take 25 mg by mouth every morning.     Marland Kitchen levothyroxine (SYNTHROID, LEVOTHROID) 100 MCG tablet Take 100 mcg by mouth daily before breakfast.    . lisinopril (PRINIVIL,ZESTRIL) 40 MG tablet Take 40 mg by mouth every morning.     . metFORMIN (GLUCOPHAGE) 500 MG tablet Take 500 mg by mouth 2 (two) times daily with a meal.    . metoprolol succinate (TOPROL-XL) 50 MG 24 hr tablet  Take 50 mg by mouth every morning. Take with or immediately following a meal.    . nitroGLYCERIN (NITROSTAT) 0.4 MG SL tablet DISSOLVE ONE TABLET UNDER THE TONGUE EVERY 5 MINUTES AS NEEDED FOR CHEST PAIN 25 tablet 5  . potassium chloride SA (K-DUR,KLOR-CON) 20 MEQ tablet Take 10 mEq by mouth every morning.     . senna-docusate (SENOKOT-S) 8.6-50 MG tablet Take 1 tablet by mouth 2 (two) times daily. While taking pain meds to prevent constipation 30 tablet 0  . spironolactone (ALDACTONE) 25 MG tablet Take 0.5 tablets (12.5 mg total) by mouth daily. 45 tablet 3  . tamsulosin (FLOMAX) 0.4 MG CAPS capsule Take 0.4 mg by mouth daily.     No current facility-administered medications on file prior to visit.     Allergies  Allergen Reactions  . Penicillins Rash    Has patient had a PCN reaction causing immediate rash, facial/tongue/throat swelling, SOB or lightheadedness with hypotension:no Has patient had a PCN reaction causing severe rash involving mucus membranes or skin necrosis: no Has patient had a PCN reaction that required hospitalization no Has patient had a PCN reaction occurring within the last 10 years: no If all of the above answers are "NO", then may proceed with Cephalosporin use.      Assessment/Plan:  1. Hypertension - BP greatly improved with decrease in caffeine intake and now at goal of < 130/80. Rash nearly resolved with spironolactone d/c. Though some DBP in home log are low, no concern for in-office diastolic hypotension. Home BP wrist cuff seems to be fairly accurate, however technique less than ideal.  -No medication changes, continue current regimen -Continue with current caffeine intake or less -Counseled on wrist cuff technique -Follow up with Rx Clinic as needed   Pt seen with Devota Pace, PGY2.  Nahomy Limburg E. Owin Vignola, PharmD, CPP, BCACP [facility] [address], [place], [place] [address] Phone: [phone]; Fax: [phone] 07/27/2017 11:24 AM

## 2017-07-27 NOTE — Patient Instructions (Signed)
Thanks for coming to see [place] today!   Your blood pressure goal is less than 130/80. You have done a great job cutting down on the caffeine! Keep up the good work.   Please follow up with [place] only when needed.

## 2017-09-27 DIAGNOSIS — E1151 Type 2 diabetes mellitus with diabetic peripheral angiopathy without gangrene: Secondary | ICD-10-CM | POA: Diagnosis not present

## 2017-09-27 DIAGNOSIS — E7800 Pure hypercholesterolemia, unspecified: Secondary | ICD-10-CM | POA: Diagnosis not present

## 2017-09-27 DIAGNOSIS — I739 Peripheral vascular disease, unspecified: Secondary | ICD-10-CM | POA: Diagnosis not present

## 2017-09-27 DIAGNOSIS — I10 Essential (primary) hypertension: Secondary | ICD-10-CM | POA: Diagnosis not present

## 2017-09-27 DIAGNOSIS — I2510 Atherosclerotic heart disease of native coronary artery without angina pectoris: Secondary | ICD-10-CM | POA: Diagnosis not present

## 2017-09-27 DIAGNOSIS — E039 Hypothyroidism, unspecified: Secondary | ICD-10-CM | POA: Diagnosis not present

## 2017-12-23 DIAGNOSIS — I10 Essential (primary) hypertension: Secondary | ICD-10-CM | POA: Diagnosis not present

## 2017-12-23 DIAGNOSIS — E1151 Type 2 diabetes mellitus with diabetic peripheral angiopathy without gangrene: Secondary | ICD-10-CM | POA: Diagnosis not present

## 2017-12-23 DIAGNOSIS — E039 Hypothyroidism, unspecified: Secondary | ICD-10-CM | POA: Diagnosis not present

## 2017-12-23 DIAGNOSIS — E119 Type 2 diabetes mellitus without complications: Secondary | ICD-10-CM | POA: Diagnosis not present

## 2017-12-23 DIAGNOSIS — N400 Enlarged prostate without lower urinary tract symptoms: Secondary | ICD-10-CM | POA: Diagnosis not present

## 2017-12-23 DIAGNOSIS — I2510 Atherosclerotic heart disease of native coronary artery without angina pectoris: Secondary | ICD-10-CM | POA: Diagnosis not present

## 2017-12-23 DIAGNOSIS — Z7984 Long term (current) use of oral hypoglycemic drugs: Secondary | ICD-10-CM | POA: Diagnosis not present

## 2017-12-23 DIAGNOSIS — E782 Mixed hyperlipidemia: Secondary | ICD-10-CM | POA: Diagnosis not present

## 2017-12-31 ENCOUNTER — Ambulatory Visit (INDEPENDENT_AMBULATORY_CARE_PROVIDER_SITE_OTHER)
Admission: RE | Admit: 2017-12-31 | Discharge: 2017-12-31 | Disposition: A | Discharge status: Home / Self Care | Payer: PPO | Source: Ambulatory Visit | Attending: Family | Admitting: Family

## 2017-12-31 ENCOUNTER — Ambulatory Visit (HOSPITAL_COMMUNITY)
Admission: RE | Admit: 2017-12-31 | Discharge: 2017-12-31 | Disposition: A | Discharge status: Home / Self Care | Payer: PPO | Source: Ambulatory Visit | Attending: Family | Admitting: Family

## 2017-12-31 ENCOUNTER — Ambulatory Visit: Payer: PPO | Admitting: Family

## 2017-12-31 ENCOUNTER — Encounter: Payer: Self-pay | Admitting: Family

## 2017-12-31 VITALS — BP 120/65 | HR 51 | Temp 97.0°F | Resp 16 | Ht 65.0 in | Wt 194.0 lb

## 2017-12-31 DIAGNOSIS — I872 Venous insufficiency (chronic) (peripheral): Secondary | ICD-10-CM

## 2017-12-31 DIAGNOSIS — I10 Essential (primary) hypertension: Secondary | ICD-10-CM | POA: Insufficient documentation

## 2017-12-31 DIAGNOSIS — E1151 Type 2 diabetes mellitus with diabetic peripheral angiopathy without gangrene: Secondary | ICD-10-CM

## 2017-12-31 DIAGNOSIS — R0989 Other specified symptoms and signs involving the circulatory and respiratory systems: Secondary | ICD-10-CM | POA: Insufficient documentation

## 2017-12-31 DIAGNOSIS — Z87891 Personal history of nicotine dependence: Secondary | ICD-10-CM | POA: Insufficient documentation

## 2017-12-31 DIAGNOSIS — B351 Tinea unguium: Secondary | ICD-10-CM

## 2017-12-31 DIAGNOSIS — I6523 Occlusion and stenosis of bilateral carotid arteries: Secondary | ICD-10-CM

## 2017-12-31 NOTE — Patient Instructions (Signed)
Stroke Prevention Some health problems and behaviors may make it more likely for you to have a stroke. Below are ways to lessen your risk of having a stroke.  Be active for at least 30 minutes on most or all days.  Do not smoke. Try not to be around others who smoke.  Do not drink too much alcohol. ? Do not have more than 2 drinks a day if you are a man. ? Do not have more than 1 drink a day if you are a woman and are not pregnant.  Eat healthy foods, such as fruits and vegetables. If you were put on a specific diet, follow the diet as told.  Keep your cholesterol levels under control through diet and medicines. Look for foods that are low in saturated fat, trans fat, cholesterol, and are high in fiber.  If you have diabetes, follow all diet plans and take your medicine as told.  Ask your doctor if you need treatment to lower your blood pressure. If you have high blood pressure (hypertension), follow all diet plans and take your medicine as told by your doctor.  If you are 20-64 years old, have your blood pressure checked every 3-5 years. If you are age 23 or older, have your blood pressure checked every year.  Keep a healthy weight. Eat foods that are low in calories, salt, saturated fat, trans fat, and cholesterol.  Do not take drugs.  Avoid birth control pills, if this applies. Talk to your doctor about the risks of taking birth control pills.  Talk to your doctor if you have sleep problems (sleep apnea).  Take all medicine as told by your doctor. ? You may be told to take aspirin or blood thinner medicine. Take this medicine as told by your doctor. ? Understand your medicine instructions.  Make sure any other conditions you have are being taken care of.  Get help right away if:  You suddenly lose feeling (you feel numb) or have weakness in your face, arm, or leg.  Your face or eyelid hangs down to one side.  You suddenly feel confused.  You have trouble talking  (aphasia) or understanding what people are saying.  You suddenly have trouble seeing in one or both eyes.  You suddenly have trouble walking.  You are dizzy.  You lose your balance or your movements are clumsy (uncoordinated).  You suddenly have a very bad headache and you do not know the cause.  You have new chest pain.  Your heart feels like it is fluttering or skipping a beat (irregular heartbeat). Do not wait to see if the symptoms above go away. Get help right away. Call your local emergency services (911 in [place] Do not drive yourself to the hospital. This information is not intended to replace advice given to you by your health care provider. Make sure you discuss any questions you have with your health care provider. Document Released: 04/12/2012 Document Revised: 03/19/2016 Document Reviewed: 04/14/2013 Elsevier Interactive Patient Education  2018 [employer].     Chronic Venous Insufficiency Chronic venous insufficiency, also called venous stasis, is a condition that prevents blood from being pumped effectively through the veins in your legs. Blood may no longer be pumped effectively from the legs back to the heart. This condition can range from mild to severe. With proper treatment, you should be able to continue with an active life. What are the causes? Chronic venous insufficiency occurs when the vein walls become stretched, weakened, or  damaged, or when valves within the vein are damaged. Some common causes of this include:  High blood pressure inside the veins (venous hypertension).  Increased blood pressure in the leg veins from long periods of sitting or standing.  A blood clot that blocks blood flow in a vein (deep vein thrombosis, DVT).  Inflammation of a vein (phlebitis) that causes a blood clot to form.  Tumors in the pelvis that cause blood to back up.  What increases the risk? The following factors may make you more likely to develop this  condition:  Having a family history of this condition.  Obesity.  Pregnancy.  Living without enough physical activity or exercise (sedentary lifestyle).  Smoking.  Having a job that requires long periods of standing or sitting in one place.  Being a certain age. Women in their 60s and 53s and men in their 40s are more likely to develop this condition.  What are the signs or symptoms? Symptoms of this condition include:  Veins that are enlarged, bulging, or twisted (varicose veins).  Skin breakdown or ulcers.  Reddened or discolored skin on the front of the leg.  Brown, smooth, tight, and painful skin just above the ankle, usually on the inside of the leg (lipodermatosclerosis).  Swelling.  How is this diagnosed? This condition may be diagnosed based on:  Your medical history.  A physical exam.  Tests, such as: ? A procedure that creates an image of a blood vessel and nearby organs and provides information about blood flow through the blood vessel (duplex ultrasound). ? A procedure that tests blood flow (plethysmography). ? A procedure to look at the veins using X-ray and dye (venogram).  How is this treated? The goals of treatment are to help you return to an active life and to minimize pain or disability. Treatment depends on the severity of your condition, and it may include:  Wearing compression stockings. These can help relieve symptoms and help prevent your condition from getting worse. However, they do not cure the condition.  Sclerotherapy. This is a procedure involving an injection of a material that "dissolves" damaged veins.  Surgery. This may involve: ? Removing a diseased vein (vein stripping). ? Cutting off blood flow through the vein (laser ablation surgery). ? Repairing a valve.  Follow these instructions at home:  Wear compression stockings as told by your health care provider. These stockings help to prevent blood clots and reduce swelling in  your legs.  Take over-the-counter and prescription medicines only as told by your health care provider.  Stay active by exercising, walking, or doing different activities. Ask your health care provider what activities are safe for you and how much exercise you need.  Drink enough fluid to keep your urine clear or pale yellow.  Do not use any products that contain nicotine or tobacco, such as cigarettes and e-cigarettes. If you need help quitting, ask your health care provider.  Keep all follow-up visits as told by your health care provider. This is important. Contact a health care provider if:  You have redness, swelling, or more pain in the affected area.  You see a red streak or line that extends up or down from the affected area.  You have skin breakdown or a loss of skin in the affected area, even if the breakdown is small.  You get an injury in the affected area. Get help right away if:  You get an injury and an open wound in the affected area.  You  have severe pain that does not get better with medicine.  You have sudden numbness or weakness in the foot or ankle below the affected area, or you have trouble moving your foot or ankle.  You have a fever and you have worse or persistent symptoms.  You have chest pain.  You have shortness of breath. Summary  Chronic venous insufficiency, also called venous stasis, is a condition that prevents blood from being pumped effectively through the veins in your legs.  Chronic venous insufficiency occurs when the vein walls become stretched, weakened, or damaged, or when valves within the vein are damaged.  Treatment for this condition depends on how severe your condition is, and it may involve wearing compression stockings or having a procedure.  Make sure you stay active by exercising, walking, or doing different activities. Ask your health care provider what activities are safe for you and how much exercise you need. This  information is not intended to replace advice given to you by your health care provider. Make sure you discuss any questions you have with your health care provider. Document Released: 02/15/2007 Document Revised: 08/31/2016 Document Reviewed: 08/31/2016 Elsevier Interactive Patient Education  2017 [employer]      Peripheral Vascular Disease Peripheral vascular disease (PVD) is a disease of the blood vessels that are not part of your heart and brain. A simple term for PVD is poor circulation. In most cases, PVD narrows the blood vessels that carry blood from your heart to the rest of your body. This can result in a decreased supply of blood to your arms, legs, and internal organs, like your stomach or kidneys. However, it most often affects a person's lower legs and feet. There are two types of PVD.  Organic PVD. This is the more common type. It is caused by damage to the structure of blood vessels.  Functional PVD. This is caused by conditions that make blood vessels contract and tighten (spasm).  Without treatment, PVD tends to get worse over time. PVD can also lead to acute ischemic limb. This is when an arm or limb suddenly has trouble getting enough blood. This is a medical emergency. Follow these instructions at home:  Take medicines only as told by your doctor.  Do not use any tobacco products, including cigarettes, chewing tobacco, or electronic cigarettes. If you need help quitting, ask your doctor.  Lose weight if you are overweight, and maintain a healthy weight as told by your doctor.  Eat a diet that is low in fat and cholesterol. If you need help, ask your doctor.  Exercise regularly. Ask your doctor for some good activities for you.  Take good care of your feet. ? Wear comfortable shoes that fit well. ? Check your feet often for any cuts or sores. Contact a doctor if:  You have cramps in your legs while walking.  You have leg pain when you are at rest.  You  have coldness in a leg or foot.  Your skin changes.  You are unable to get or have an erection (erectile dysfunction).  You have cuts or sores on your feet that are not healing. Get help right away if:  Your arm or leg turns cold and blue.  Your arms or legs become red, warm, swollen, painful, or numb.  You have chest pain or trouble breathing.  You suddenly have weakness in your face, arm, or leg.  You become very confused or you cannot speak.  You suddenly have a very  bad headache.  You suddenly cannot see. This information is not intended to replace advice given to you by your health care provider. Make sure you discuss any questions you have with your health care provider. Document Released: 01/06/2010 Document Revised: 03/19/2016 Document Reviewed: 03/22/2014 Elsevier Interactive Patient Education  2017 [employer].

## 2017-12-31 NOTE — Progress Notes (Signed)
[facility] [place]   CC: Follow up peripheral artery occlusive disease  History of Present Illness [name] [name] is a 77 y.o. male whom Dr. Imogene Burn has monitored for mild chronic venous insufficiency and mild PAD.  Pt is a known diabetic that was found on routine examination by his PCP to have decreased pulses in his L leg. The patient denies any intermittent claudication or rest pain though he notes his ambulation is limited. His primary complaint was right leg swelling with extended standing or ambulation. The patient had bilateral GSV harvested for his 6 vessel CABG in 1998.  The patient has had no history of DVT, known history of varicose vein, no history of venous stasis ulcers, no history oflymphedema and known history of skin changes in lower legs. There is no family history of venous disorders. Dr. Imogene Burn advised compressive therapy for pt's chromic venous insufficiency and pt has been wearing knee high graduated compression hose with excellent results.   He and his wife exercise at a gym 5 days/week, he also stays active at his home.   Pt Diabetic: Yes, does not know what his last A1C was, seems to be in good control  Pt smoker: former smoker, quit in 1964  Pt meds include: Statin :Yes Betablocker: Yes ASA: Yes Other anticoagulants/antiplatelets: no    Past Medical History:  Diagnosis Date  . Benign prostatic hypertrophy with lower urinary tract symptoms (LUTS)   . Bilateral edema of lower extremity    IMPROVED W/ TED HOSE  . CAD (coronary artery disease)    cardiologist-  dr Verdis Prime--  s/p  cabg x6 in 1998  . Heart murmur   . Hiatal hernia   . History of kidney stones   . Hydronephrosis, left   . Hyperlipidemia   . Hypertension   . Hypothyroidism   . Left ureteral stone   . Nephrolithiasis    right side per ct  non-obstructive  . RBBB (right bundle branch block)   . 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 Sigmoid diverticulosis   . Sinus  bradycardia   . Type 2 diabetes mellitus (HCC)     Social History Social History   Tobacco Use  . Smoking status: Former Smoker    Years: 10.00    Types: Cigarettes    Last attempt to quit: 10/26/1966    Years since quitting: 51.2  . Smokeless tobacco: Never Used  Substance Use Topics  . Alcohol use: No    Alcohol/week: 0.0 oz  . Drug use: No    Family History Family History  Problem Relation Age of Onset  . Tuberculosis Mother   . CAD Father     Past Surgical History:  Procedure Laterality Date  . CATARACT EXTRACTION W/ INTRAOCULAR LENS  IMPLANT, BILATERAL  2011  . CORONARY ARTERY BYPASS GRAFT  1998   CABG X 6 --   LIMA  to the LAD and  other undefined SVG's  . CYSTOSCOPY WITH BIOPSY N/A 11/29/2015   Procedure: CYSTOSCOPY WITH BIOPSY AND FULGERATION;  Surgeon: Sebastian Ache, MD;  Location: [facility];  Service: Urology;  Laterality: N/A;  . CYSTOSCOPY/RETROGRADE/URETEROSCOPY/STONE EXTRACTION WITH BASKET Left 11/29/2015   Procedure: CYSTOSCOPY/RETROGRADE/DIAGNOSTIC URETEROSCOPY/STONE EXTRACTION;  Surgeon: Sebastian Ache, MD;  Location: [facility];  Service: Urology;  Laterality: Left;  . EXTRACORPOREAL SHOCK WAVE LITHOTRIPSY Left 08-12-2015  . LEFT HEART CATHETERIZATION WITH CORONARY/GRAFT ANGIOGRAM  06/16/2013   Procedure: LEFT HEART CATHETERIZATION WITH CORONARY/GRAFT  Rosalin Hawking;  Surgeon: Lesleigh Noe, MD;  Location: [facility] C[facility] LAB;  Service: Cardiovascular; Native vessel occlusive disease w/ ostial total occlusion RCA, dLM, & heavy calicification LM LAD CX RCA/  SVG to diagonal patent, SVG to marginal 1 patent & 2 occluded, LIMA to dLAD patent/ dCFX collateral from obtuse marginal graft/  normal LVF  . TRANSTHORACIC ECHOCARDIOGRAM  05-23-2015  dr Sherilyn Cooter smith   mild LVH,  grade 1 diastolic dysfunction,  ef 55-060%,  mild AV calcifacation without stenosis,  mild AR and MR,  moderate LAE,  trivial PR and TR,  mild increase pulmonary pressure     Allergies  Allergen Reactions  . Spironolactone Rash  . Penicillins Rash    Has patient had a PCN reaction causing immediate rash, facial/tongue/throat swelling, SOB or lightheadedness with hypotension:no Has patient had a PCN reaction causing severe rash involving mucus membranes or skin necrosis: no Has patient had a PCN reaction that required hospitalization no Has patient had a PCN reaction occurring within the last 10 years: no If all of the above answers are "NO", then may proceed with Cephalosporin use.     Current Outpatient Medications  Medication Sig Dispense Refill  . amLODipine (NORVASC) 5 MG tablet Take 5 mg by mouth daily.    Marland Kitchen aspirin EC 81 MG tablet Take 1 tablet (81 mg total) by mouth daily. 90 tablet 3  . atorvastatin (LIPITOR) 80 MG tablet Take 40 mg by mouth every morning.     . finasteride (PROSCAR) 5 MG tablet Take 5 mg by mouth every morning.     . Flaxseed, Linseed, (FLAX SEEDS PO) Take 1 capsule by mouth daily.    . hydrochlorothiazide (HYDRODIURIL) 25 MG tablet Take 25 mg by mouth every morning.     Marland Kitchen levothyroxine (SYNTHROID, LEVOTHROID) 100 MCG tablet Take 100 mcg by mouth daily before breakfast.    . lisinopril (PRINIVIL,ZESTRIL) 40 MG tablet Take 40 mg by mouth every morning.     . metFORMIN (GLUCOPHAGE) 500 MG tablet Take 500 mg by mouth 2 (two) times daily with a meal.    . metoprolol succinate (TOPROL-XL) 50 MG 24 hr tablet Take 50 mg by mouth every morning. Take with or immediately following a meal.    . nitroGLYCERIN (NITROSTAT) 0.4 MG SL tablet DISSOLVE ONE TABLET UNDER THE TONGUE EVERY 5 MINUTES AS NEEDED FOR CHEST PAIN 25 tablet 5  . potassium chloride SA (K-DUR,KLOR-CON) 20 MEQ tablet Take 10 mEq by mouth every morning.     . senna-docusate (SENOKOT-S) 8.6-50 MG tablet Take 1 tablet by mouth 2 (two) times daily. While taking pain meds to prevent constipation 30 tablet 0  . tamsulosin (FLOMAX) 0.4 MG CAPS capsule Take 0.4 mg by mouth daily.      No current facility-administered medications for this visit.     ROS: See HPI for pertinent positives and negatives.   Physical Examination  Vitals:   12/31/17 1126 12/31/17 1130  BP: 109/64 120/65  Pulse: (!) 51   Resp: 16   Temp: (!) 97 F (36.1 C)   TempSrc: Oral   SpO2: 97%   Weight: 194 lb (88 kg)   Height: 5\' 5"  (1.651 m)    Body mass index is 32.28 kg/m.  General: A&O x 3, WDWN, obese male Gait: normal HENT: No gross abnormalities  Eyes: PERRLA Pulmonary: Sym exp, respirations are non labored, good air movt, CTAB, no rales, rhonchi, or wheezing Cardiac: RRR, Nl S1, S2, no detected murmur  Vascular: Vessel Right Left  Radial Palpable Palpable  Carotid Palpable, without bruit Palpable, without bruit  Aorta Not palpable N/A  Femoral 2+Palpable 2+Palpable  Popliteal Not palpable Not palpable  PT not Palpable Not Palpable  DP Faintly Palpable Faintly Palpable   Gastrointestinal: soft, NTND, -G/R, - HSM, moderate sized asymptomatic reducible ventral hernia, - CVAT B, unable to palpate aorta due to pannus Musculoskeletal: M/S 5/5 throughout , Extremities without ischemic changes, B healed GSV harvest incisions, R LDS, L spider and varicose veins. No peripheral edema. Thick toenails. Skin: no rashes, no cellulitis, no ulcers noted. Neurologic: A&O X 3; appropriate affect, Sensation is normal; MOTOR FUNCTION:  moving all extremities equally, motor strength 5/5 throughout. Speech is fluent/normal. CN 2-12 intact. Psychiatric: Thought content is normal, mood appropriate for clinical situation.     ASSESSMENT: [name] [name] is a 77 y.o. male who presents with: B leg chronic venous insufficiency (C4), non-hemodynamically significant calcific peripheral arterial disease c/w diabetic complications.   He no longer has swelling in either lower leg since he has been wearing his knee high graduated compression hose during  the day. He is more active, has no claudication sx's, exercises regularly, has no signs of ischemia in his feet/legs, has no chronic venous stasis dermatitis.    He has no history of stroke or TIA.  DATA  Carotid Duplex (12/31/17): Right ICA: 1-39% stenosis Left ICA: 40-59% stenosis Bilateral vertebral artery flow is antegrade.  Bilateral subclavian artery waveforms are normal.  No previous exam for comparison.    ABI (Date: 12/31/2017):  R:   ABI: [place] (was [place] on 12-28-16),   PT: tri  DP: tri  TBI:  0.65 (was 0.70)  L:   ABI: [place] (was [place]),   PT: tri  DP: tri  TBI: 0.54 (was 0.75)  Vessels remain non compressible, bilateral waveforms are normal and triphasic. Slight decline in bilateral TBI.    BLE Venous Insufficiency Duplex (Date: 12/21/2014):   RLE: No DVT and SVT, No GSV: harvested, +deep venous reflux: PTV, incompetent calf perforator  LLE: No DVT and SVT, No GSV: harvested, +deep venous reflux: SFJ, incompetent calf perforator     PLAN:  Referral to podiatrist for regular trimming of thick toenails and diabetic foot exam, [facility].  Based on the patient's vascular studies and examination, pt will return to clinic in 1 year with ABI's and carotid duplex.    I discussed in depth with the patient the nature of atherosclerosis, and emphasized the importance of maximal medical management including strict control of blood pressure, blood glucose, and lipid levels, obtaining regular exercise, and continued cessation of smoking.  The patient is aware that without maximal medical management the underlying atherosclerotic disease process will progress, limiting the benefit of any interventions.  The patient was given information about PAD including signs, symptoms, treatment, what symptoms should prompt the patient to seek immediate medical care, and risk reduction measures to take.  Charisse MarchSuzanne Azlyn Wingler, RN, MSN, FNP-C [facility]  [place] Office Phone: [phone]  Clinic MD: Randie HeinzCain  12/31/17 11:35 AM

## 2018-02-01 ENCOUNTER — Ambulatory Visit: Payer: PPO | Admitting: Podiatry

## 2018-02-01 ENCOUNTER — Encounter: Payer: Self-pay | Admitting: Podiatry

## 2018-02-01 VITALS — BP 110/56 | HR 51

## 2018-02-01 DIAGNOSIS — B351 Tinea unguium: Secondary | ICD-10-CM

## 2018-02-01 DIAGNOSIS — M79675 Pain in left toe(s): Secondary | ICD-10-CM | POA: Diagnosis not present

## 2018-02-01 DIAGNOSIS — M79674 Pain in right toe(s): Secondary | ICD-10-CM | POA: Diagnosis not present

## 2018-02-01 DIAGNOSIS — E1159 Type 2 diabetes mellitus with other circulatory complications: Secondary | ICD-10-CM | POA: Diagnosis not present

## 2018-02-01 NOTE — Progress Notes (Signed)
This patient presents to the office with chief complaint of long thick nails and diabetic feet.  This patient  says he is having no pain and discomfort in his feet.  This patient says he has long thick painful nails both great toes..  These nails are painful walking and wearing hshoes.  Patient has a history of all his toenails removed but the toenails on his big toes have regrown thick and long..  This patient presents the office today for treatment of the  long nails and a foot evaluation due to history of  diabetes.  General Appearance  Alert, conversant and in no acute stress.  Vascular  Dorsalis pedis and posterior tibial  pulses are weakly  palpable  bilaterally.  Capillary return is within normal limits  bilaterally. Temperature is within normal limits  bilaterally.  Neurologic  Senn-Weinstein monofilament wire test within normal limits  bilaterally. Muscle power within normal limits bilaterally.  Nails Thick disfigured discolored nails with subungual debris  Hallux  bilaterally. No evidence of bacterial infection or drainage bilaterally.  Orthopedic  No limitations of motion of motion feet .  No crepitus or effusions noted.  HAV  B/L.  Skin  normotropic skin with no porokeratosis noted bilaterally.  No signs of infections or ulcers noted.     Onychomycosis  Diabetes with diminished pulses both feet.  IE  Debride nails x 10.  A diabetic foot exam was performed and there is diminished pulses found both feet.Marland Kitchen.   RTC 3 months.   Helane GuntherGregory Emmet Messer DPM

## 2018-02-08 DIAGNOSIS — N401 Enlarged prostate with lower urinary tract symptoms: Secondary | ICD-10-CM | POA: Diagnosis not present

## 2018-02-08 DIAGNOSIS — N200 Calculus of kidney: Secondary | ICD-10-CM | POA: Diagnosis not present

## 2018-02-08 DIAGNOSIS — R3912 Poor urinary stream: Secondary | ICD-10-CM | POA: Diagnosis not present

## 2018-04-06 ENCOUNTER — Ambulatory Visit: Payer: PPO | Admitting: Podiatry

## 2018-04-24 DIAGNOSIS — E782 Mixed hyperlipidemia: Secondary | ICD-10-CM | POA: Diagnosis not present

## 2018-04-24 DIAGNOSIS — I2510 Atherosclerotic heart disease of native coronary artery without angina pectoris: Secondary | ICD-10-CM | POA: Diagnosis not present

## 2018-04-24 DIAGNOSIS — I10 Essential (primary) hypertension: Secondary | ICD-10-CM | POA: Diagnosis not present

## 2018-04-24 DIAGNOSIS — N400 Enlarged prostate without lower urinary tract symptoms: Secondary | ICD-10-CM | POA: Diagnosis not present

## 2018-04-24 DIAGNOSIS — E039 Hypothyroidism, unspecified: Secondary | ICD-10-CM | POA: Diagnosis not present

## 2018-04-24 DIAGNOSIS — E1151 Type 2 diabetes mellitus with diabetic peripheral angiopathy without gangrene: Secondary | ICD-10-CM | POA: Diagnosis not present

## 2018-04-24 DIAGNOSIS — Z7984 Long term (current) use of oral hypoglycemic drugs: Secondary | ICD-10-CM | POA: Diagnosis not present

## 2018-04-24 DIAGNOSIS — E119 Type 2 diabetes mellitus without complications: Secondary | ICD-10-CM | POA: Diagnosis not present

## 2018-05-03 DIAGNOSIS — H02834 Dermatochalasis of left upper eyelid: Secondary | ICD-10-CM | POA: Diagnosis not present

## 2018-05-03 DIAGNOSIS — H04123 Dry eye syndrome of bilateral lacrimal glands: Secondary | ICD-10-CM | POA: Diagnosis not present

## 2018-05-03 DIAGNOSIS — H18413 Arcus senilis, bilateral: Secondary | ICD-10-CM | POA: Diagnosis not present

## 2018-05-03 DIAGNOSIS — H02831 Dermatochalasis of right upper eyelid: Secondary | ICD-10-CM | POA: Diagnosis not present

## 2018-05-03 DIAGNOSIS — Z961 Presence of intraocular lens: Secondary | ICD-10-CM | POA: Diagnosis not present

## 2018-05-03 DIAGNOSIS — I10 Essential (primary) hypertension: Secondary | ICD-10-CM | POA: Diagnosis not present

## 2018-05-06 DIAGNOSIS — Z0000 Encounter for general adult medical examination without abnormal findings: Secondary | ICD-10-CM | POA: Diagnosis not present

## 2018-05-06 DIAGNOSIS — E1151 Type 2 diabetes mellitus with diabetic peripheral angiopathy without gangrene: Secondary | ICD-10-CM | POA: Diagnosis not present

## 2018-05-06 DIAGNOSIS — Z1389 Encounter for screening for other disorder: Secondary | ICD-10-CM | POA: Diagnosis not present

## 2018-05-06 DIAGNOSIS — I10 Essential (primary) hypertension: Secondary | ICD-10-CM | POA: Diagnosis not present

## 2018-05-06 DIAGNOSIS — I2510 Atherosclerotic heart disease of native coronary artery without angina pectoris: Secondary | ICD-10-CM | POA: Diagnosis not present

## 2018-05-06 DIAGNOSIS — E039 Hypothyroidism, unspecified: Secondary | ICD-10-CM | POA: Diagnosis not present

## 2018-05-06 DIAGNOSIS — R413 Other amnesia: Secondary | ICD-10-CM | POA: Diagnosis not present

## 2018-05-06 DIAGNOSIS — N400 Enlarged prostate without lower urinary tract symptoms: Secondary | ICD-10-CM | POA: Diagnosis not present

## 2018-05-06 DIAGNOSIS — Z7984 Long term (current) use of oral hypoglycemic drugs: Secondary | ICD-10-CM | POA: Diagnosis not present

## 2018-05-06 DIAGNOSIS — E7800 Pure hypercholesterolemia, unspecified: Secondary | ICD-10-CM | POA: Diagnosis not present

## 2018-05-06 DIAGNOSIS — I739 Peripheral vascular disease, unspecified: Secondary | ICD-10-CM | POA: Diagnosis not present

## 2018-05-09 DIAGNOSIS — E538 Deficiency of other specified B group vitamins: Secondary | ICD-10-CM | POA: Diagnosis not present

## 2018-05-16 DIAGNOSIS — E538 Deficiency of other specified B group vitamins: Secondary | ICD-10-CM | POA: Diagnosis not present

## 2018-05-23 DIAGNOSIS — E538 Deficiency of other specified B group vitamins: Secondary | ICD-10-CM | POA: Diagnosis not present

## 2018-05-30 DIAGNOSIS — E538 Deficiency of other specified B group vitamins: Secondary | ICD-10-CM | POA: Diagnosis not present

## 2018-06-09 ENCOUNTER — Encounter: Payer: Self-pay | Admitting: Interventional Cardiology

## 2018-07-04 ENCOUNTER — Ambulatory Visit: Payer: PPO | Admitting: Interventional Cardiology

## 2018-07-04 ENCOUNTER — Encounter: Payer: Self-pay | Admitting: Interventional Cardiology

## 2018-07-04 VITALS — BP 144/74 | HR 53 | Ht 65.0 in | Wt 199.2 lb

## 2018-07-04 DIAGNOSIS — I739 Peripheral vascular disease, unspecified: Secondary | ICD-10-CM

## 2018-07-04 DIAGNOSIS — E7800 Pure hypercholesterolemia, unspecified: Secondary | ICD-10-CM

## 2018-07-04 DIAGNOSIS — E1151 Type 2 diabetes mellitus with diabetic peripheral angiopathy without gangrene: Secondary | ICD-10-CM | POA: Diagnosis not present

## 2018-07-04 DIAGNOSIS — D518 Other vitamin B12 deficiency anemias: Secondary | ICD-10-CM | POA: Diagnosis not present

## 2018-07-04 DIAGNOSIS — I25812 Atherosclerosis of bypass graft of coronary artery of transplanted heart without angina pectoris: Secondary | ICD-10-CM | POA: Diagnosis not present

## 2018-07-04 DIAGNOSIS — I10 Essential (primary) hypertension: Secondary | ICD-10-CM | POA: Diagnosis not present

## 2018-07-04 NOTE — Progress Notes (Signed)
Cardiology Office Note:    Date:  07/04/2018   ID:  [name] [name], DOB Jan 12, 1941, MRN [id number]  PCP:  Renford Dills, MD  Cardiologist:  No primary care provider on file.   Referring MD: Renford Dills, MD   Chief Complaint  Patient presents with  . Coronary Artery Disease    History of Present Illness:    [name] [name] is a 77 y.o. male with a hx of for follow-up of CAD, history of coronary artery bypass grafting, type 2 diabetes, peripheral arterial disease, essential hypertension, and diabetes mellitus.  Overall doing well.  Has intermittent angina when he does moderate to heavy physical activity such as mowing his grass.  Cold weather can also precipitate episodes.  This pattern of angina is been present for greater than 3 years.  Rest relieves the discomfort.  He has not needed to use nitroglycerin.  He denies shortness of breath palpitations   Past Medical History:  Diagnosis Date  . Benign prostatic hypertrophy with lower urinary tract symptoms (LUTS)   . Bilateral edema of lower extremity    IMPROVED W/ TED HOSE  . CAD (coronary artery disease)    cardiologist-  dr Verdis Prime--  s/p  cabg x6 in 1998  . Heart murmur   . Hiatal hernia   . History of kidney stones   . Hydronephrosis, left   . Hyperlipidemia   . Hypertension   . Hypothyroidism   . Left ureteral stone   . Nephrolithiasis    right side per ct  non-obstructive  . RBBB (right bundle branch block)   . 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 Sigmoid diverticulosis   . Sinus bradycardia   . Type 2 diabetes mellitus (HCC)     Past Surgical History:  Procedure Laterality Date  . CATARACT EXTRACTION W/ INTRAOCULAR LENS  IMPLANT, BILATERAL  2011  . CORONARY ARTERY BYPASS GRAFT  1998   CABG X 6 --   LIMA  to the LAD and  other undefined SVG's  . CYSTOSCOPY WITH BIOPSY N/A 11/29/2015   Procedure: CYSTOSCOPY WITH BIOPSY AND FULGERATION;  Surgeon: Sebastian Ache, MD;  Location: [facility];  Service:  Urology;  Laterality: N/A;  . CYSTOSCOPY/RETROGRADE/URETEROSCOPY/STONE EXTRACTION WITH BASKET Left 11/29/2015   Procedure: CYSTOSCOPY/RETROGRADE/DIAGNOSTIC URETEROSCOPY/STONE EXTRACTION;  Surgeon: Sebastian Ache, MD;  Location: [facility];  Service: Urology;  Laterality: Left;  . EXTRACORPOREAL SHOCK WAVE LITHOTRIPSY Left 08-12-2015  . LEFT HEART CATHETERIZATION WITH CORONARY/GRAFT ANGIOGRAM  06/16/2013   Procedure: LEFT HEART CATHETERIZATION WITH Isabel Caprice;  Surgeon: Lesleigh Noe, MD;  Location: [facility] CATH LAB;  Service: Cardiovascular; Native vessel occlusive disease w/ ostial total occlusion RCA, dLM, & heavy calicification LM LAD CX RCA/  SVG to diagonal patent, SVG to marginal 1 patent & 2 occluded, LIMA to dLAD patent/ dCFX collateral from obtuse marginal graft/  normal LVF  . TRANSTHORACIC ECHOCARDIOGRAM  05-23-2015  dr Sherilyn Cooter Izreal Kock   mild LVH,  grade 1 diastolic dysfunction,  ef 55-060%,  mild AV calcifacation without stenosis,  mild AR and MR,  moderate LAE,  trivial PR and TR,  mild increase pulmonary pressure    Current Medications: Current Meds  Medication Sig  . amLODipine (NORVASC) 5 MG tablet Take 5 mg by mouth daily.  Marland Kitchen aspirin EC 81 MG tablet Take 1 tablet (81 mg total) by mouth daily.  Marland Kitchen atorvastatin (LIPITOR) 80 MG tablet Take 40 mg by mouth every morning.   Marland Kitchen  Cyanocobalamin (B-12 IJ) Use as directed monthly.  . finasteride (PROSCAR) 5 MG tablet Take 5 mg by mouth every morning.   . Flaxseed, Linseed, (FLAX SEEDS PO) Take 1 capsule by mouth daily.  . hydrochlorothiazide (HYDRODIURIL) 25 MG tablet Take 25 mg by mouth every morning.   Marland Kitchen levothyroxine (SYNTHROID, LEVOTHROID) 100 MCG tablet Take 100 mcg by mouth daily before breakfast.  . lisinopril (PRINIVIL,ZESTRIL) 40 MG tablet Take 40 mg by mouth every morning.   . metFORMIN (GLUCOPHAGE) 500 MG tablet Take 500 mg by mouth 2 (two) times daily with a meal.  . metoprolol succinate (TOPROL-XL) 50 MG  24 hr tablet Take 50 mg by mouth every morning. Take with or immediately following a meal.  . nitroGLYCERIN (NITROSTAT) 0.4 MG SL tablet DISSOLVE ONE TABLET UNDER THE TONGUE EVERY 5 MINUTES AS NEEDED FOR CHEST PAIN  . potassium chloride SA (K-DUR,KLOR-CON) 20 MEQ tablet Take 10 mEq by mouth every morning.   . senna-docusate (SENOKOT-S) 8.6-50 MG tablet Take 1 tablet by mouth 2 (two) times daily. While taking pain meds to prevent constipation  . tamsulosin (FLOMAX) 0.4 MG CAPS capsule Take 0.4 mg by mouth daily.     Allergies:   Spironolactone and Penicillins   Social History   Socioeconomic History  . Marital status: Married    Spouse name: Not on file  . Number of children: Not on file  . Years of education: Not on file  . Highest education level: Not on file  Occupational History  . Not on file  Social Needs  . Financial resource strain: Not on file  . Food insecurity:    Worry: Not on file    Inability: Not on file  . Transportation needs:    Medical: Not on file    Non-medical: Not on file  Tobacco Use  . Smoking status: Former Smoker    Years: 10.00    Types: Cigarettes    Last attempt to quit: 10/26/1966    Years since quitting: 51.7  . Smokeless tobacco: Never Used  Substance and Sexual Activity  . Alcohol use: No    Alcohol/week: 0.0 standard drinks  . Drug use: No  . Sexual activity: Not on file  Lifestyle  . Physical activity:    Days per week: Not on file    Minutes per session: Not on file  . Stress: Not on file  Relationships  . Social connections:    Talks on phone: Not on file    Gets together: Not on file    Attends religious service: Not on file    Active member of club or organization: Not on file    Attends meetings of clubs or organizations: Not on file    Relationship status: Not on file  Other Topics Concern  . Not on file  Social History Narrative  . Not on file     Family History: The patient's family history includes CAD in his father;  Tuberculosis in his mother.  ROS:   Please see the history of present illness.    He snores.  Has joint discomfort and muscle pain involving his knees.  All other systems reviewed and are negative.  EKGs/Labs/Other Studies Reviewed:    The following studies were reviewed today: None  EKG:  EKG is  ordered today.  The ekg ordered today demonstrates sinus bradycardia, right bundle branch block, inferior infarct, old.  No change when compared to prior tracings.  Recent Labs: No results found for requested labs  within last 8760 hours.  Recent Lipid Panel No results found for: CHOL, TRIG, HDL, CHOLHDL, VLDL, LDLCALC, LDLDIRECT  Physical Exam:    VS:  BP (!) 144/74   Pulse (!) 53   Ht 5\' 5"  (1.651 m)   Wt 199 lb 3.2 oz (90.4 kg)   BMI 33.15 kg/m     Wt Readings from Last 3 Encounters:  07/04/18 199 lb 3.2 oz (90.4 kg)  12/31/17 194 lb (88 kg)  06/01/17 205 lb 9.6 oz (93.3 kg)     GEN: Moderate obesity.  Well nourished, well developed in no acute distress HEENT: Normal NECK: No JVD. LYMPHATICS: No lymphadenopathy CARDIAC: RRR, 2/6 right upper sternal border systolic murmur, no gallop, no edema. VASCULAR: 2+ radial and dorsalis pedis bilateral pulses.  No bruits. RESPIRATORY:  Clear to auscultation without rales, wheezing or rhonchi  ABDOMEN: Soft, non-tender, non-distended, No pulsatile mass, MUSCULOSKELETAL: No deformity  SKIN: Warm and dry NEUROLOGIC:  Alert and oriented x 3 PSYCHIATRIC:  Normal affect   ASSESSMENT:    1. Coronary artery disease involving bypass graft of transplanted heart without angina pectoris   2. Essential hypertension, benign   3. Pure hypercholesterolemia   4. PVD (peripheral vascular disease) (HCC)   5. Type 2 diabetes mellitus with peripheral artery disease (HCC)    PLAN:    In order of problems listed above:  1. Stable angina.  Known chronic coronary disease.  Encouraged continued active lifestyle and notify us of change in pattern i.e.  prolonged episodes, easier provocation, poor response to nitroglycerin, etc. 2. Adequate current blood pressure control although target is 130/80 or less.  We discussed salt restriction and diet. 3. LDL target less than 70.  Last LDL was 85. 4. Walking will improve lower extremity collateralization.  Encouraged physical activity. 5. Hemoglobin A1c target less than 7.  Long discussion concerning consideration of plant-based transition from carbohydrate heavy diet.  Decrease animal fats in diet.  Increase aerobic activity attempting to achieve 150 minutes of moderate exercise per week.  Call if anginal symptoms worsen.   Medication Adjustments/Labs and Tests Ordered: Current medicines are reviewed at length with the patient today.  Concerns regarding medicines are outlined above.  Orders Placed This Encounter  Procedures  . EKG 12-Lead   No orders of the defined types were placed in this encounter.   Patient Instructions  Medication Instructions:  Your physician recommends that you continue on your current medications as directed. Please refer to the Current Medication list given to you today.   Labwork: none  Testing/Procedures: none  Follow-Up: Your physician wants you to follow-up in: 12 months.  You will receive a reminder letter in the mail two months in advance. If you don't receive a letter, please call our office to schedule the follow-up appointment.   Any Other Special Instructions Will Be Listed Below (If Applicable).     If you need a refill on your cardiac medications before your next appointment, please call your pharmacy.      Signed, Lesleigh Noe, MD  07/04/2018 5:56 PM    [facility]

## 2018-07-04 NOTE — Patient Instructions (Signed)

## 2018-08-04 DIAGNOSIS — E538 Deficiency of other specified B group vitamins: Secondary | ICD-10-CM | POA: Diagnosis not present

## 2018-09-05 DIAGNOSIS — E538 Deficiency of other specified B group vitamins: Secondary | ICD-10-CM | POA: Diagnosis not present

## 2018-10-06 DIAGNOSIS — D518 Other vitamin B12 deficiency anemias: Secondary | ICD-10-CM | POA: Diagnosis not present

## 2018-11-07 DIAGNOSIS — I359 Nonrheumatic aortic valve disorder, unspecified: Secondary | ICD-10-CM | POA: Diagnosis not present

## 2018-11-07 DIAGNOSIS — E538 Deficiency of other specified B group vitamins: Secondary | ICD-10-CM | POA: Diagnosis not present

## 2018-11-07 DIAGNOSIS — I2510 Atherosclerotic heart disease of native coronary artery without angina pectoris: Secondary | ICD-10-CM | POA: Diagnosis not present

## 2018-11-07 DIAGNOSIS — I739 Peripheral vascular disease, unspecified: Secondary | ICD-10-CM | POA: Diagnosis not present

## 2018-11-07 DIAGNOSIS — R413 Other amnesia: Secondary | ICD-10-CM | POA: Diagnosis not present

## 2018-11-07 DIAGNOSIS — E039 Hypothyroidism, unspecified: Secondary | ICD-10-CM | POA: Diagnosis not present

## 2018-11-07 DIAGNOSIS — I10 Essential (primary) hypertension: Secondary | ICD-10-CM | POA: Diagnosis not present

## 2018-11-07 DIAGNOSIS — E1151 Type 2 diabetes mellitus with diabetic peripheral angiopathy without gangrene: Secondary | ICD-10-CM | POA: Diagnosis not present

## 2018-11-07 DIAGNOSIS — E7800 Pure hypercholesterolemia, unspecified: Secondary | ICD-10-CM | POA: Diagnosis not present

## 2018-12-08 DIAGNOSIS — R413 Other amnesia: Secondary | ICD-10-CM | POA: Diagnosis not present

## 2019-01-09 DIAGNOSIS — E538 Deficiency of other specified B group vitamins: Secondary | ICD-10-CM | POA: Diagnosis not present

## 2019-01-20 ENCOUNTER — Ambulatory Visit: Payer: PPO | Admitting: Family

## 2019-01-20 ENCOUNTER — Encounter (HOSPITAL_COMMUNITY): Payer: PPO

## 2019-02-10 DIAGNOSIS — E538 Deficiency of other specified B group vitamins: Secondary | ICD-10-CM | POA: Diagnosis not present

## 2019-02-13 DIAGNOSIS — N200 Calculus of kidney: Secondary | ICD-10-CM | POA: Diagnosis not present

## 2019-02-13 DIAGNOSIS — R3912 Poor urinary stream: Secondary | ICD-10-CM | POA: Diagnosis not present

## 2019-02-13 DIAGNOSIS — N401 Enlarged prostate with lower urinary tract symptoms: Secondary | ICD-10-CM | POA: Diagnosis not present

## 2019-03-14 DIAGNOSIS — R413 Other amnesia: Secondary | ICD-10-CM | POA: Diagnosis not present

## 2019-03-23 ENCOUNTER — Other Ambulatory Visit: Payer: Self-pay

## 2019-03-23 DIAGNOSIS — I872 Venous insufficiency (chronic) (peripheral): Secondary | ICD-10-CM

## 2019-03-27 ENCOUNTER — Ambulatory Visit (HOSPITAL_COMMUNITY)
Admission: RE | Admit: 2019-03-27 | Discharge: 2019-03-27 | Disposition: A | Discharge status: Home / Self Care | Payer: PPO | Source: Ambulatory Visit | Attending: Family | Admitting: Family

## 2019-03-27 ENCOUNTER — Encounter: Payer: Self-pay | Admitting: Family

## 2019-03-27 ENCOUNTER — Ambulatory Visit (INDEPENDENT_AMBULATORY_CARE_PROVIDER_SITE_OTHER): Payer: PPO | Admitting: Family

## 2019-03-27 ENCOUNTER — Other Ambulatory Visit: Payer: Self-pay

## 2019-03-27 ENCOUNTER — Ambulatory Visit (INDEPENDENT_AMBULATORY_CARE_PROVIDER_SITE_OTHER)
Admission: RE | Admit: 2019-03-27 | Discharge: 2019-03-27 | Disposition: A | Discharge status: Home / Self Care | Payer: PPO | Source: Ambulatory Visit | Attending: Family | Admitting: Family

## 2019-03-27 VITALS — BP 160/71 | HR 47 | Temp 97.7°F | Resp 18 | Ht 65.0 in | Wt 190.0 lb

## 2019-03-27 DIAGNOSIS — I6523 Occlusion and stenosis of bilateral carotid arteries: Secondary | ICD-10-CM

## 2019-03-27 DIAGNOSIS — B351 Tinea unguium: Secondary | ICD-10-CM

## 2019-03-27 DIAGNOSIS — I872 Venous insufficiency (chronic) (peripheral): Secondary | ICD-10-CM

## 2019-03-27 DIAGNOSIS — E1151 Type 2 diabetes mellitus with diabetic peripheral angiopathy without gangrene: Secondary | ICD-10-CM

## 2019-03-27 NOTE — Patient Instructions (Signed)
Peripheral Vascular Disease  Peripheral vascular disease (PVD) is a disease of the blood vessels that are not part of your heart and brain. A simple term for PVD is poor circulation. In most cases, PVD narrows the blood vessels that carry blood from your heart to the rest of your body. This can reduce the supply of blood to your arms, legs, and internal organs, like your stomach or kidneys. However, PVD most often affects a person's lower legs and feet. Without treatment, PVD tends to get worse. PVD can also lead to acute ischemic limb. This is when an arm or leg suddenly cannot get enough blood. This is a medical emergency. Follow these instructions at home: Lifestyle  Do not use any products that contain nicotine or tobacco, such as cigarettes and e-cigarettes. If you need help quitting, ask your doctor.  Lose weight if you are overweight. Or, stay at a healthy weight as told by your doctor.  Eat a diet that is low in fat and cholesterol. If you need help, ask your doctor.  Exercise regularly. Ask your doctor for activities that are right for you. General instructions  Take over-the-counter and prescription medicines only as told by your doctor.  Take good care of your feet: ? Wear comfortable shoes that fit well. ? Check your feet often for any cuts or sores.  Keep all follow-up visits as told by your doctor This is important. Contact a doctor if:  You have cramps in your legs when you walk.  You have leg pain when you are at rest.  You have coldness in a leg or foot.  Your skin changes.  You are unable to get or have an erection (erectile dysfunction).  You have cuts or sores on your feet that do not heal. Get help right away if:  Your arm or leg turns cold, numb, and blue.  Your arms or legs become red, warm, swollen, painful, or numb.  You have chest pain.  You have trouble breathing.  You suddenly have weakness in your face, arm, or leg.  You become very  confused or you cannot speak.  You suddenly have a very bad headache.  You suddenly cannot see. Summary  Peripheral vascular disease (PVD) is a disease of the blood vessels.  A simple term for PVD is poor circulation. Without treatment, PVD tends to get worse.  Treatment may include exercise, low fat and low cholesterol diet, and quitting smoking. This information is not intended to replace advice given to you by your health care provider. Make sure you discuss any questions you have with your health care provider. Document Released: 01/06/2010 Document Revised: 11/19/2016 Document Reviewed: 11/19/2016 Elsevier Interactive Patient Education  2019 [employer] Inc[employer]     Stroke Prevention Some medical conditions and lifestyle choices can lead to a higher risk for a stroke. You can help to prevent a stroke by making nutrition, lifestyle, and other changes. What nutrition changes can be made?   Eat healthy foods. ? Choose foods that are high in fiber. These include:  Fresh fruits.  Fresh vegetables.  Whole grains. ? Eat at least 5 or more servings of fruits and vegetables each day. Try to fill half of your plate at each meal with fruits and vegetables. ? Choose lean protein foods. These include:  Lowfat (lean) cuts of meat.  Chicken without skin.  Fish.  Tofu.  Beans.  Nuts. ? Eat low-fat dairy products. ? Avoid foods that:  Are high in salt (sodium).    Have saturated fat.  Have trans fat.  Have cholesterol.  Are processed.  Are premade.  Follow eating guidelines as told by your doctor. These may include: ? Reducing how many calories you eat and drink each day. ? Limiting how much salt you eat or drink each day to 1,500 milligrams (mg). ? Using only healthy fats for cooking. These include:  Olive oil.  Canola oil.  Sunflower oil. ? Counting how many carbohydrates you eat and drink each day. What lifestyle changes can be made?  Try to stay at a healthy  weight. Talk to your doctor about what a good weight is for you.  Get at least 30 minutes of moderate physical activity at least 5 days a week. This can include: ? Fast walking. ? Biking. ? Swimming.  Do not use any products that have nicotine or tobacco. This includes cigarettes and e-cigarettes. If you need help quitting, ask your doctor. Avoid being around tobacco smoke in general.  Limit how much alcohol you drink to no more than 1 drink a day for nonpregnant women and 2 drinks a day for men. One drink equals 12 oz of beer, 5 oz of wine, or 1 oz of hard liquor.  Do not use drugs.  Avoid taking birth control pills. Talk to your doctor about the risks of taking birth control pills if: ? You are over 35 years old. ? You smoke. ? You get migraines. ? You have had a blood clot. What other changes can be made?  Manage your cholesterol. ? It is important to eat a healthy diet. ? If your cholesterol cannot be managed through your diet, you may also need to take medicines. Take medicines as told by your doctor.  Manage your diabetes. ? It is important to eat a healthy diet and to exercise regularly. ? If your blood sugar cannot be managed through diet and exercise, you may need to take medicines. Take medicines as told by your doctor.  Control your high blood pressure (hypertension). ? Try to keep your blood pressure below 130/80. This can help lower your risk of stroke. ? It is important to eat a healthy diet and to exercise regularly. ? If your blood pressure cannot be managed through diet and exercise, you may need to take medicines. Take medicines as told by your doctor. ? Ask your doctor if you should check your blood pressure at home. ? Have your blood pressure checked every year. Do this even if your blood pressure is normal.  Talk to your doctor about getting checked for a sleep disorder. Signs of this can include: ? Snoring a lot. ? Feeling very tired.  Take  over-the-counter and prescription medicines only as told by your doctor. These may include aspirin or blood thinners (antiplatelets or anticoagulants).  Make sure that any other medical conditions you have are managed. Where to find more information  American Stroke Association: www.strokeassociation.org  National Stroke Association: www.stroke.org Get help right away if:  You have any symptoms of stroke. "BE FAST" is an easy way to remember the main warning signs: ? B - Balance. Signs are dizziness, sudden trouble walking, or loss of balance. ? E - Eyes. Signs are trouble seeing or a sudden change in how you see. ? F - Face. Signs are sudden weakness or loss of feeling of the face, or the face or eyelid drooping on one side. ? A - Arms. Signs are weakness or loss of feeling in an arm. This happens   suddenly and usually on one side of the body. ? S - Speech. Signs are sudden trouble speaking, slurred speech, or trouble understanding what people say. ? T - Time. Time to call emergency services. Write down what time symptoms started.  You have other signs of stroke, such as: ? A sudden, very bad headache with no known cause. ? Feeling sick to your stomach (nausea). ? Throwing up (vomiting). ? Jerky movements you cannot control (seizure). These symptoms may represent a serious problem that is an emergency. Do not wait to see if the symptoms will go away. Get medical help right away. Call your local emergency services (911 in the [place] Do not drive yourself to the hospital. Summary  You can prevent a stroke by eating healthy, exercising, not smoking, drinking less alcohol, and treating other health problems, such as diabetes, high blood pressure, or high cholesterol.  Do not use any products that contain nicotine or tobacco, such as cigarettes and e-cigarettes.  Get help right away if you have any signs or symptoms of a stroke. This information is not intended to replace advice given to  you by your health care provider. Make sure you discuss any questions you have with your health care provider. Document Released: 04/12/2012 Document Revised: 01/13/2017 Document Reviewed: 01/13/2017 Elsevier Interactive Patient Education  2019 [employer].     To decrease swelling in your feet and legs: Elevate feet above slightly bent knees, feet above heart, overnight and 3-4 times per day for 20 minutes.   Chronic Venous Insufficiency Chronic venous insufficiency, also called venous stasis, is a condition that prevents blood from being pumped effectively through the veins in your legs. Blood may no longer be pumped effectively from the legs back to the heart. This condition can range from mild to severe. With proper treatment, you should be able to continue with an active life. What are the causes? Chronic venous insufficiency occurs when the vein walls become stretched, weakened, or damaged, or when valves within the vein are damaged. Some common causes of this include:  High blood pressure inside the veins (venous hypertension).  Increased blood pressure in the leg veins from long periods of sitting or standing.  A blood clot that blocks blood flow in a vein (deep vein thrombosis, DVT).  Inflammation of a vein (phlebitis) that causes a blood clot to form.  Tumors in the pelvis that cause blood to back up. What increases the risk? The following factors may make you more likely to develop this condition:  Having a family history of this condition.  Obesity.  Pregnancy.  Living without enough physical activity or exercise (sedentary lifestyle).  Smoking.  Having a job that requires long periods of standing or sitting in one place.  Being a certain age. Women in their 91s and 88s and men in their 19s are more likely to develop this condition. What are the signs or symptoms? Symptoms of this condition include:  Veins that are enlarged, bulging, or twisted (varicose  veins).  Skin breakdown or ulcers.  Reddened or discolored skin on the front of the leg.  Brown, smooth, tight, and painful skin just above the ankle, usually on the inside of the leg (lipodermatosclerosis).  Swelling. How is this diagnosed? This condition may be diagnosed based on:  Your medical history.  A physical exam.  Tests, such as: ? A procedure that creates an image of a blood vessel and nearby organs and provides information about blood flow through the blood vessel (  duplex ultrasound). ? A procedure that tests blood flow (plethysmography). ? A procedure to look at the veins using X-ray and dye (venogram). How is this treated? The goals of treatment are to help you return to an active life and to minimize pain or disability. Treatment depends on the severity of your condition, and it may include:  Wearing compression stockings. These can help relieve symptoms and help prevent your condition from getting worse. However, they do not cure the condition.  Sclerotherapy. This is a procedure involving an injection of a material that "dissolves" damaged veins.  Surgery. This may involve: ? Removing a diseased vein (vein stripping). ? Cutting off blood flow through the vein (laser ablation surgery). ? Repairing a valve. Follow these instructions at home:      Wear compression stockings as told by your health care provider. These stockings help to prevent blood clots and reduce swelling in your legs.  Take over-the-counter and prescription medicines only as told by your health care provider.  Stay active by exercising, walking, or doing different activities. Ask your health care provider what activities are safe for you and how much exercise you need.  Drink enough fluid to keep your urine clear or pale yellow.  Do not use any products that contain nicotine or tobacco, such as cigarettes and e-cigarettes. If you need help quitting, ask your health care provider.  Keep  all follow-up visits as told by your health care provider. This is important. Contact a health care provider if:  You have redness, swelling, or more pain in the affected area.  You see a red streak or line that extends up or down from the affected area.  You have skin breakdown or a loss of skin in the affected area, even if the breakdown is small.  You get an injury in the affected area. Get help right away if:  You get an injury and an open wound in the affected area.  You have severe pain that does not get better with medicine.  You have sudden numbness or weakness in the foot or ankle below the affected area, or you have trouble moving your foot or ankle.  You have a fever and you have worse or persistent symptoms.  You have chest pain.  You have shortness of breath. Summary  Chronic venous insufficiency, also called venous stasis, is a condition that prevents blood from being pumped effectively through the veins in your legs.  Chronic venous insufficiency occurs when the vein walls become stretched, weakened, or damaged, or when valves within the vein are damaged.  Treatment for this condition depends on how severe your condition is, and it may involve wearing compression stockings or having a procedure.  Make sure you stay active by exercising, walking, or doing different activities. Ask your health care provider what activities are safe for you and how much exercise you need. This information is not intended to replace advice given to you by your health care provider. Make sure you discuss any questions you have with your health care provider. Document Released: 02/15/2007 Document Revised: 08/31/2016 Document Reviewed: 08/31/2016 Elsevier Interactive Patient Education  2019 [employer]

## 2019-03-27 NOTE — Progress Notes (Signed)
[facility] [place] HISTORY AND PHYSICAL   CC: Follow up extracranial carotid artery stenosis, mild PAD, and mild chronic venous insufficiency    History of Present Illness:   [name] [name] is a 78 y.o. male whom Dr. Imogene Burn has monitored for mild chronic venous insufficiency, mild PAD, and extracranial carotid artery stenosis.   He has no history of stroke or TIA.   Pt is a known diabetic that was found on routine examination by his PCP to have decreased pulses in his L leg. The patient denies any intermittent claudication or rest pain though he notes his ambulation is limited. His primary complaint was right leg swelling with extended standing or ambulation. The patient had bilateral GSV harvested for his 6 vessel CABG in 1998.  The patient has had no history of DVT, known history of varicose vein, no history of venous stasis ulcers, no history oflymphedema and known history of skin changes in lower legs. There is no family history of venous disorders. Dr. Imogene Burn advised compressive therapy for pt's chromic venous insufficiency and pt has been wearing knee high graduated compression hose with excellent results.   He and his wife were exercising at a gym 5 days/week when the gym was open, he also stays active at his home.  Diabetic: Yes,does not know what his last A1C was, seems to be in good control Tobacco use: former smoker, quit in 1964, smoked x 10 years  Pt meds include: Statin :Yes Betablocker: Yes ASA: Yes Other anticoagulants/antiplatelets: no  Current Outpatient Medications  Medication Sig Dispense Refill  . amLODipine (NORVASC) 5 MG tablet Take 5 mg by mouth daily.    Marland Kitchen aspirin EC 81 MG tablet Take 1 tablet (81 mg total) by mouth daily. 90 tablet 3  . atorvastatin (LIPITOR) 80 MG tablet Take 40 mg by mouth every morning.     . Cyanocobalamin (B-12 IJ) Use as directed monthly.    . finasteride (PROSCAR) 5 MG tablet Take 5 mg by mouth every  morning.     . Flaxseed, Linseed, (FLAX SEEDS PO) Take 1 capsule by mouth daily.    . hydrochlorothiazide (HYDRODIURIL) 25 MG tablet Take 25 mg by mouth every morning.     Marland Kitchen levothyroxine (SYNTHROID, LEVOTHROID) 100 MCG tablet Take 100 mcg by mouth daily before breakfast.    . lisinopril (PRINIVIL,ZESTRIL) 40 MG tablet Take 40 mg by mouth every morning.     . metFORMIN (GLUCOPHAGE) 500 MG tablet Take 500 mg by mouth 2 (two) times daily with a meal.    . metoprolol succinate (TOPROL-XL) 50 MG 24 hr tablet Take 50 mg by mouth every morning. Take with or immediately following a meal.    . potassium chloride SA (K-DUR,KLOR-CON) 20 MEQ tablet Take 10 mEq by mouth every morning.     . senna-docusate (SENOKOT-S) 8.6-50 MG tablet Take 1 tablet by mouth 2 (two) times daily. While taking pain meds to prevent constipation 30 tablet 0  . tamsulosin (FLOMAX) 0.4 MG CAPS capsule Take 0.4 mg by mouth daily.    . nitroGLYCERIN (NITROSTAT) 0.4 MG SL tablet DISSOLVE ONE TABLET UNDER THE TONGUE EVERY 5 MINUTES AS NEEDED FOR CHEST PAIN (Patient not taking: Reported on 03/27/2019) 25 tablet 5   No current facility-administered medications for this visit.     Past Medical History:  Diagnosis Date  . Benign prostatic hypertrophy with lower urinary tract symptoms (LUTS)   . Bilateral edema of lower extremity    IMPROVED W/  TED HOSE  . CAD (coronary artery disease)    cardiologist-  dr Verdis Prime--  s/p  cabg x6 in 1998  . Heart murmur   . Hiatal hernia   . History of kidney stones   . Hydronephrosis, left   . Hyperlipidemia   . Hypertension   . Hypothyroidism   . Left ureteral stone   . Nephrolithiasis    right side per ct  non-obstructive  . RBBB (right bundle branch block)   . 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 Sigmoid diverticulosis   . Sinus bradycardia   . Type 2 diabetes mellitus (HCC)     Social History Social History   Tobacco Use  . Smoking status: Former Smoker    Years: 10.00    Types:  Cigarettes    Last attempt to quit: 10/26/1966    Years since quitting: 52.4  . Smokeless tobacco: Never Used  Substance Use Topics  . Alcohol use: No    Alcohol/week: 0.0 standard drinks  . Drug use: No    Family History Family History  Problem Relation Age of Onset  . Tuberculosis Mother   . CAD Father     Surgical History Past Surgical History:  Procedure Laterality Date  . CATARACT EXTRACTION W/ INTRAOCULAR LENS  IMPLANT, BILATERAL  2011  . CORONARY ARTERY BYPASS GRAFT  1998   CABG X 6 --   LIMA  to the LAD and  other undefined SVG's  . CYSTOSCOPY WITH BIOPSY N/A 11/29/2015   Procedure: CYSTOSCOPY WITH BIOPSY AND FULGERATION;  Surgeon: Sebastian Ache, MD;  Location[facility];  Service: Urology;  Laterality: N/A;  . CYSTOSCOPY/RETROGRADE/URETEROSCOPY/STONE EXTRACTION WITH BASKET Left 11/29/2015   Procedure: CYSTOSCOPY/RETROGRADE/DIAGNOSTIC URETEROSCOPY/STONE EXTRACTION;  Surgeon: Sebastian Ache, MD;  Location: [facility];  Service: Urology;  Laterality: Left;  . EXTRACORPOREAL SHOCK WAVE LITHOTRIPSY Left 08-12-2015  . LEFT HEART CATHETERIZATION WITH CORONARY/GRAFT ANGIOGRAM  06/16/2013   Procedure: LEFT HEART CATHETERIZATION WITH Isabel Caprice;  Surgeon: Lesleigh Noe, MD;  Location: [facility] CATH LAB;  Service: Cardiovascular; Native vessel occlusive disease w/ ostial total occlusion RCA, dLM, & heavy calicification LM LAD CX RCA/  SVG to diagonal patent, SVG to marginal 1 patent & 2 occluded, LIMA to dLAD patent/ dCFX collateral from obtuse marginal graft/  normal LVF  . TRANSTHORACIC ECHOCARDIOGRAM  05-23-2015  dr Sherilyn Cooter smith   mild LVH,  grade 1 diastolic dysfunction,  ef 55-060%,  mild AV calcifacation without stenosis,  mild AR and MR,  moderate LAE,  trivial PR and TR,  mild increase pulmonary pressure    Allergies  Allergen Reactions  . Spironolactone Rash  . Penicillins Rash    Has patient had a PCN reaction causing immediate rash,  facial/tongue/throat swelling, SOB or lightheadedness with hypotension:no Has patient had a PCN reaction causing severe rash involving mucus membranes or skin necrosis: no Has patient had a PCN reaction that required hospitalization no Has patient had a PCN reaction occurring within the last 10 years: no If all of the above answers are "NO", then may proceed with Cephalosporin use.     Current Outpatient Medications  Medication Sig Dispense Refill  . amLODipine (NORVASC) 5 MG tablet Take 5 mg by mouth daily.    Marland Kitchen aspirin EC 81 MG tablet Take 1 tablet (81 mg total) by mouth daily. 90 tablet 3  . atorvastatin (LIPITOR) 80 MG tablet Take 40 mg by mouth every morning.     Marland Kitchen  Cyanocobalamin (B-12 IJ) Use as directed monthly.    . finasteride (PROSCAR) 5 MG tablet Take 5 mg by mouth every morning.     . Flaxseed, Linseed, (FLAX SEEDS PO) Take 1 capsule by mouth daily.    . hydrochlorothiazide (HYDRODIURIL) 25 MG tablet Take 25 mg by mouth every morning.     Marland Kitchen levothyroxine (SYNTHROID, LEVOTHROID) 100 MCG tablet Take 100 mcg by mouth daily before breakfast.    . lisinopril (PRINIVIL,ZESTRIL) 40 MG tablet Take 40 mg by mouth every morning.     . metFORMIN (GLUCOPHAGE) 500 MG tablet Take 500 mg by mouth 2 (two) times daily with a meal.    . metoprolol succinate (TOPROL-XL) 50 MG 24 hr tablet Take 50 mg by mouth every morning. Take with or immediately following a meal.    . potassium chloride SA (K-DUR,KLOR-CON) 20 MEQ tablet Take 10 mEq by mouth every morning.     . senna-docusate (SENOKOT-S) 8.6-50 MG tablet Take 1 tablet by mouth 2 (two) times daily. While taking pain meds to prevent constipation 30 tablet 0  . tamsulosin (FLOMAX) 0.4 MG CAPS capsule Take 0.4 mg by mouth daily.    . nitroGLYCERIN (NITROSTAT) 0.4 MG SL tablet DISSOLVE ONE TABLET UNDER THE TONGUE EVERY 5 MINUTES AS NEEDED FOR CHEST PAIN (Patient not taking: Reported on 03/27/2019) 25 tablet 5   No current facility-administered  medications for this visit.      REVIEW OF SYSTEMS: See HPI for pertinent positives and negatives.  Physical Examination Vitals:   03/27/19 1548 03/27/19 1552  BP: (!) 153/74 (!) 160/71  Pulse: (!) 47 (!) 47  Resp: 18   Temp: 97.7 F (36.5 C)   TempSrc: Temporal   SpO2: 97%   Weight: 190 lb (86.2 kg)   Height: 5\' 5"  (1.651 m)    Body mass index is 31.62 kg/m.  General:  WDWN obese male in NAD Gait: Normal HENT: WNL Eyes: Pupils equal Pulmonary: normal non-labored breathing, good air movement in all fields, CTAB, without rales, rhonchi,  wheezing Cardiac: Regular rhythm, bradycardic (on a beta blocker), + low grade murmur Abdomen: soft, NT, no masses palpated Skin: no rashes, no ulcers, no cellulitis.   VASCULAR EXAM  Carotid Bruits Right Left   Negative Negative      Radial pulses are 2+ palpable bilaterally   Adominal aortic pulse is not palpable                      VASCULAR EXAM: Extremities without ischemic changes, without Gangrene; without open wounds. 1+ non pitting and trace pitting edema in both ankles and lower pretibial. Thick toenails of both great toes.                                                                                                        LE Pulses Right Left       FEMORAL  1+ palpable  2+ palpable        POPLITEAL  2+ palpable   1+ palpable  POSTERIOR TIBIAL  not palpable   not palpable        DORSALIS PEDIS      ANTERIOR TIBIAL 2+ palpable  1+ palpable     Musculoskeletal: no muscle wasting or atrophy; no peripheral edema  Neurologic:  A&O X 3; appropriate affect, sensation is normal; speech is normal, CN 2-12 intact, pain and light touch intact in extremities, motor exam as listed above. Psychiatric: Normal thought content, mood appropriate to clinical situation.    DATA  Carotid Duplex (03-27-19): Right Carotid: Velocities in the right ICA are consistent with a 1-39% stenosis. Left Carotid: Velocities in the left  ICA are consistent with a 40-59% stenosis,               calcific plaque may obscure higher velocity.. Vertebrals:  Bilateral vertebral arteries demonstrate antegrade flow. Subclavians: Normal flow hemodynamics were seen in bilateral subclavian              arteries. No change from 12-31-17.     ABI (Date: 03/27/2019): ABI Findings: +---------+------------------+-----+---------+--------+ Right    Rt Pressure (mmHg)IndexWaveform Comment  +---------+------------------+-----+---------+--------+ Brachial 150                                      +---------+------------------+-----+---------+--------+ PTA      254               1.67 triphasic         +---------+------------------+-----+---------+--------+ DP       254               1.67 triphasic         +---------+------------------+-----+---------+--------+ Great Toe94                0.62 Abnormal          +---------+------------------+-----+---------+--------+  +---------+------------------+-----+--------+-------+ Left     Lt Pressure (mmHg)IndexWaveformComment +---------+------------------+-----+--------+-------+ Brachial 152                                    +---------+------------------+-----+--------+-------+ PTA      254               1.67 biphasic        +---------+------------------+-----+--------+-------+ DP       254               1.67 biphasic        +---------+------------------+-----+--------+-------+ Great Toe245               1.61 Abnormal        +---------+------------------+-----+--------+-------+ +-------+-----------+-----------+------------+------------+ ABI/TBIToday's ABIToday's TBIPrevious ABIPrevious TBI +-------+-----------+-----------+------------+------------+ Right  [place]         0.62       [place]          0.65         +-------+-----------+-----------+------------+------------+ Left   [place]         1.61                 0.54          +-------+-----------+-----------+------------+------------+ Summary: Right: Resting right ankle-brachial index indicates noncompressible right lower extremity arteries.The right toe-brachial index is abnormal. Left: Resting left ankle-brachial index indicates noncompressible left lower extremity arteries. TBIs are unreliable due to calcified arteries.    BLE Venous Insufficiency Duplex (Date: 12/21/2014):   RLE: No DVT and SVT, No  GSV: harvested, +deep venous reflux: PTV, incompetent calf perforator  LLE: No DVT and SVT, No GSV: harvested, +deep venous reflux: SFJ, incompetent calf perforator   ASSESSMENT:  [name] [name] is a 78 y.o. male who presents with: B leg chronic venous insufficiency (C4), non-hemodynamically significant calcific peripheral arterial disease c/w diabetic complications.   He has significantly less swelling in either lower leg since he has been wearing his knee high graduated compression hose during the day. He is more active, has no claudication sx's, exercises regularly, has no signs of ischemia in his feet/legs, has no chronic venous stasis dermatitis. His bilateral popliteal and pedal pulses are palpable.   ABI's today indicate continued non compressible vessels, triphasic waveforms in the right, biphasic in the left. No change in the degree of extracranial carotid artery stenosis compared to March 2019; minimal stenosis of the right, mild/moderate in the left.   He has no history of stroke or TIA.  His atherosclerotic risk factors include seemingly well controlled DM, remote hx of 10 years smoking, CAD, and obesity.  He takes a daily stain and ASA.    PLAN:   Based on today's exam and non-invasive vascular lab results, the patient will follow up in 1 year with the following tests: carotid duplex and ABI's.   Graduated walking program, to total 30 minutes daily, in a safe environment.   Continue follow up with his podiatrist to trim his thick  toenails of both great toes, and feet exams since he has DM and PAD.   I discussed in depth with the patient the nature of atherosclerosis, and emphasized the importance of maximal medical management including strict control of blood pressure, blood glucose, and lipid levels, obtaining regular exercise, and cessation of smoking.  The patient is aware that without maximal medical management the underlying atherosclerotic disease process will progress, limiting the benefit of any interventions.  The patient was given information about stroke prevention and what symptoms should prompt the patient to seek immediate medical care.  The patient was given information about PAD including signs, symptoms, treatment, what symptoms should prompt the patient to seek immediate medical care, and risk reduction measures to take.  Thank you for allowing [place] to participate in this patient's care.  Charisse March, RN, MSN, FNP-C [facility] Office: [phone]  Clinic MD: Myra Gianotti 03/27/2019 4:23 PM

## 2019-04-17 DIAGNOSIS — R413 Other amnesia: Secondary | ICD-10-CM | POA: Diagnosis not present

## 2019-04-24 DIAGNOSIS — N400 Enlarged prostate without lower urinary tract symptoms: Secondary | ICD-10-CM | POA: Diagnosis not present

## 2019-04-24 DIAGNOSIS — I10 Essential (primary) hypertension: Secondary | ICD-10-CM | POA: Diagnosis not present

## 2019-04-24 DIAGNOSIS — E119 Type 2 diabetes mellitus without complications: Secondary | ICD-10-CM | POA: Diagnosis not present

## 2019-04-24 DIAGNOSIS — E7800 Pure hypercholesterolemia, unspecified: Secondary | ICD-10-CM | POA: Diagnosis not present

## 2019-04-24 DIAGNOSIS — E782 Mixed hyperlipidemia: Secondary | ICD-10-CM | POA: Diagnosis not present

## 2019-04-24 DIAGNOSIS — E1151 Type 2 diabetes mellitus with diabetic peripheral angiopathy without gangrene: Secondary | ICD-10-CM | POA: Diagnosis not present

## 2019-04-24 DIAGNOSIS — I2510 Atherosclerotic heart disease of native coronary artery without angina pectoris: Secondary | ICD-10-CM | POA: Diagnosis not present

## 2019-04-24 DIAGNOSIS — E039 Hypothyroidism, unspecified: Secondary | ICD-10-CM | POA: Diagnosis not present

## 2019-05-09 DIAGNOSIS — H04123 Dry eye syndrome of bilateral lacrimal glands: Secondary | ICD-10-CM | POA: Diagnosis not present

## 2019-05-09 DIAGNOSIS — H18413 Arcus senilis, bilateral: Secondary | ICD-10-CM | POA: Diagnosis not present

## 2019-05-09 DIAGNOSIS — H02831 Dermatochalasis of right upper eyelid: Secondary | ICD-10-CM | POA: Diagnosis not present

## 2019-05-09 DIAGNOSIS — Z961 Presence of intraocular lens: Secondary | ICD-10-CM | POA: Diagnosis not present

## 2019-05-18 DIAGNOSIS — E538 Deficiency of other specified B group vitamins: Secondary | ICD-10-CM | POA: Diagnosis not present

## 2019-06-01 DIAGNOSIS — I10 Essential (primary) hypertension: Secondary | ICD-10-CM | POA: Diagnosis not present

## 2019-06-01 DIAGNOSIS — E1151 Type 2 diabetes mellitus with diabetic peripheral angiopathy without gangrene: Secondary | ICD-10-CM | POA: Diagnosis not present

## 2019-06-01 DIAGNOSIS — I2510 Atherosclerotic heart disease of native coronary artery without angina pectoris: Secondary | ICD-10-CM | POA: Diagnosis not present

## 2019-06-01 DIAGNOSIS — E039 Hypothyroidism, unspecified: Secondary | ICD-10-CM | POA: Diagnosis not present

## 2019-06-01 DIAGNOSIS — E7800 Pure hypercholesterolemia, unspecified: Secondary | ICD-10-CM | POA: Diagnosis not present

## 2019-06-01 DIAGNOSIS — E119 Type 2 diabetes mellitus without complications: Secondary | ICD-10-CM | POA: Diagnosis not present

## 2019-06-01 DIAGNOSIS — N400 Enlarged prostate without lower urinary tract symptoms: Secondary | ICD-10-CM | POA: Diagnosis not present

## 2019-06-01 DIAGNOSIS — E782 Mixed hyperlipidemia: Secondary | ICD-10-CM | POA: Diagnosis not present

## 2019-06-06 DIAGNOSIS — N476 Balanoposthitis: Secondary | ICD-10-CM | POA: Diagnosis not present

## 2019-06-09 DIAGNOSIS — N400 Enlarged prostate without lower urinary tract symptoms: Secondary | ICD-10-CM | POA: Diagnosis not present

## 2019-06-09 DIAGNOSIS — R413 Other amnesia: Secondary | ICD-10-CM | POA: Diagnosis not present

## 2019-06-09 DIAGNOSIS — I2510 Atherosclerotic heart disease of native coronary artery without angina pectoris: Secondary | ICD-10-CM | POA: Diagnosis not present

## 2019-06-09 DIAGNOSIS — Z1389 Encounter for screening for other disorder: Secondary | ICD-10-CM | POA: Diagnosis not present

## 2019-06-09 DIAGNOSIS — Z0000 Encounter for general adult medical examination without abnormal findings: Secondary | ICD-10-CM | POA: Diagnosis not present

## 2019-06-09 DIAGNOSIS — I10 Essential (primary) hypertension: Secondary | ICD-10-CM | POA: Diagnosis not present

## 2019-06-09 DIAGNOSIS — E663 Overweight: Secondary | ICD-10-CM | POA: Diagnosis not present

## 2019-06-09 DIAGNOSIS — I739 Peripheral vascular disease, unspecified: Secondary | ICD-10-CM | POA: Diagnosis not present

## 2019-06-09 DIAGNOSIS — E1151 Type 2 diabetes mellitus with diabetic peripheral angiopathy without gangrene: Secondary | ICD-10-CM | POA: Diagnosis not present

## 2019-06-09 DIAGNOSIS — E538 Deficiency of other specified B group vitamins: Secondary | ICD-10-CM | POA: Diagnosis not present

## 2019-06-09 DIAGNOSIS — E7800 Pure hypercholesterolemia, unspecified: Secondary | ICD-10-CM | POA: Diagnosis not present

## 2019-06-09 DIAGNOSIS — I359 Nonrheumatic aortic valve disorder, unspecified: Secondary | ICD-10-CM | POA: Diagnosis not present

## 2019-06-20 DIAGNOSIS — R413 Other amnesia: Secondary | ICD-10-CM | POA: Diagnosis not present

## 2019-07-19 ENCOUNTER — Telehealth: Payer: Self-pay | Admitting: Interventional Cardiology

## 2019-07-19 NOTE — Telephone Encounter (Signed)
  Wife is calling to say she needs to come with the patient to his appt on 9/28 with Dr Tamala Julian because he has a memory issue

## 2019-07-19 NOTE — Telephone Encounter (Signed)
I spoke with pt's wife who reports pt has some short term memory issues and he likes for her to go to appointments with him.  I explained the reasoning for the no visitor policy to the pt's wife and she voices understanding.  She is agreeable to pt coming alone into the appointment and being contacted by speaker phone during the appointment so she can participate over the phone.

## 2019-07-23 NOTE — Progress Notes (Signed)
Cardiology Office Note:    Date:  07/24/2019   ID:  [name] [name], DOB [birth year], MRN [id number]  PCP:  Renford Dills, MD  Cardiologist:  Lesleigh Noe, MD   Referring MD: Renford Dills, MD   Chief Complaint  Patient presents with  . Coronary Artery Disease    History of Present Illness:    [name] [name] is a 78 y.o. male with a hx of CAD, history of coronary artery bypass grafting, type 2 diabetes, peripheral arterial disease, essential hypertension, and diabetes mellitus.  He admits that he will have angina doing physical activity and cold weather.  A nitroglycerin relieves the discomfort.  He has had none since last winter.  He is otherwise doing well.  He manages the care of his own yard.  He gets more than 150 minutes of moderate activity per week.  He denies orthopnea, PND, palpitations, and syncope.  No medication side effects that he is aware of.  Most recent laboratory data were excellent.  Past Medical History:  Diagnosis Date  . Benign prostatic hypertrophy with lower urinary tract symptoms (LUTS)   . Bilateral edema of lower extremity    IMPROVED W/ TED HOSE  . CAD (coronary artery disease)    cardiologist-  dr Verdis Prime--  s/p  cabg x6 in 1998  . Heart murmur   . Hiatal hernia   . History of kidney stones   . Hydronephrosis, left   . Hyperlipidemia   . Hypertension   . Hypothyroidism   . Left ureteral stone   . Nephrolithiasis    right side per ct  non-obstructive  . RBBB (right bundle branch block)   . 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 Sigmoid diverticulosis   . Sinus bradycardia   . Type 2 diabetes mellitus (HCC)     Past Surgical History:  Procedure Laterality Date  . CATARACT EXTRACTION W/ INTRAOCULAR LENS  IMPLANT, BILATERAL  2011  . CORONARY ARTERY BYPASS GRAFT  1998   CABG X 6 --   LIMA  to the LAD and  other undefined SVG's  . CYSTOSCOPY WITH BIOPSY N/A 11/29/2015   Procedure: CYSTOSCOPY WITH BIOPSY AND FULGERATION;  Surgeon: Sebastian Ache, MD;   Location[facility];  Service: Urology;  Laterality: N/A;  . CYSTOSCOPY/RETROGRADE/URETEROSCOPY/STONE EXTRACTION WITH BASKET Left 11/29/2015   Procedure: CYSTOSCOPY/RETROGRADE/DIAGNOSTIC URETEROSCOPY/STONE EXTRACTION;  Surgeon: Sebastian Ache, MD;  Location: [facility];  Service: Urology;  Laterality: Left;  . EXTRACORPOREAL SHOCK WAVE LITHOTRIPSY Left 08-12-2015  . LEFT HEART CATHETERIZATION WITH CORONARY/GRAFT ANGIOGRAM  06/16/2013   Procedure: LEFT HEART CATHETERIZATION WITH Isabel Caprice;  Surgeon: Lesleigh Noe, MD;  Location: [facility] CATH LAB;  Service: Cardiovascular; Native vessel occlusive disease w/ ostial total occlusion RCA, dLM, & heavy calicification LM LAD CX RCA/  SVG to diagonal patent, SVG to marginal 1 patent & 2 occluded, LIMA to dLAD patent/ dCFX collateral from obtuse marginal graft/  normal LVF  . TRANSTHORACIC ECHOCARDIOGRAM  05-23-2015  dr Sherilyn Cooter [place]   mild LVH,  grade 1 diastolic dysfunction,  ef 55-060%,  mild AV calcifacation without stenosis,  mild AR and MR,  moderate LAE,  trivial PR and TR,  mild increase pulmonary pressure    Current Medications: Current Meds  Medication Sig  . amLODipine (NORVASC) 5 MG tablet Take 5 mg by mouth daily.  Marland Kitchen aspirin EC 81 MG tablet Take 1 tablet (81 mg total) by mouth daily.  Marland Kitchen  atorvastatin (LIPITOR) 80 MG tablet Take 40 mg by mouth every morning.   . Cyanocobalamin (B-12 IJ) Use as directed monthly.  . finasteride (PROSCAR) 5 MG tablet Take 5 mg by mouth every morning.   . Flaxseed, Linseed, (FLAX SEEDS PO) Take 1 capsule by mouth daily.  . hydrochlorothiazide (HYDRODIURIL) 25 MG tablet Take 25 mg by mouth every morning.   Marland Kitchen. levothyroxine (SYNTHROID, LEVOTHROID) 100 MCG tablet Take 100 mcg by mouth daily before breakfast.  . lisinopril (PRINIVIL,ZESTRIL) 40 MG tablet Take 40 mg by mouth every morning.   . metFORMIN (GLUCOPHAGE) 500 MG tablet Take 500 mg by mouth 2 (two) times daily with a  meal.  . metoprolol succinate (TOPROL-XL) 50 MG 24 hr tablet Take 50 mg by mouth every morning. Take with or immediately following a meal.  . nitroGLYCERIN (NITROSTAT) 0.4 MG SL tablet DISSOLVE ONE TABLET UNDER THE TONGUE EVERY 5 MINUTES AS NEEDED FOR CHEST PAIN  . potassium chloride SA (K-DUR,KLOR-CON) 20 MEQ tablet Take 10 mEq by mouth every morning.   . senna-docusate (SENOKOT-S) 8.6-50 MG tablet Take 1 tablet by mouth 2 (two) times daily. While taking pain meds to prevent constipation  . tamsulosin (FLOMAX) 0.4 MG CAPS capsule Take 0.4 mg by mouth daily.     Allergies:   Spironolactone and Penicillins   Social History   Socioeconomic History  . Marital status: Married    Spouse name: Not on file  . Number of children: Not on file  . Years of education: Not on file  . Highest education level: Not on file  Occupational History  . Not on file  Social Needs  . Financial resource strain: Not on file  . Food insecurity    Worry: Not on file    Inability: Not on file  . Transportation needs    Medical: Not on file    Non-medical: Not on file  Tobacco Use  . Smoking status: Former Smoker    Years: 10.00    Types: Cigarettes    Quit date: 10/26/1966    Years since quitting: 52.7  . Smokeless tobacco: Never Used  Substance and Sexual Activity  . Alcohol use: No    Alcohol/week: 0.0 standard drinks  . Drug use: No  . Sexual activity: Not on file  Lifestyle  . Physical activity    Days per week: Not on file    Minutes per session: Not on file  . Stress: Not on file  Relationships  . Social Musicianconnections    Talks on phone: Not on file    Gets together: Not on file    Attends religious service: Not on file    Active member of club or organization: Not on file    Attends meetings of clubs or organizations: Not on file    Relationship status: Not on file  Other Topics Concern  . Not on file  Social History Narrative  . Not on file     Family History: The patient's family  history includes CAD in his father; Tuberculosis in his mother.  ROS:   Please see the history of present illness.    Has some arthritic complaints and both knees.  Some decrease in memory according to his wife.  All other systems reviewed and are negative.  EKGs/Labs/Other Studies Reviewed:    The following studies were reviewed today: No recent functional data  EKG:  EKG sinus bradycardia 54 bpm, right bundle branch block, probable old inferior infarct.  No changes noted  when compared to September 2019  Recent Labs: No results found for requested labs within last 8760 hours.  Recent Lipid Panel No results found for: CHOL, TRIG, HDL, CHOLHDL, VLDL, LDLCALC, LDLDIRECT  Physical Exam:    VS:  BP 118/68   Pulse (!) 54   Ht 5\' 5"  (1.651 m)   Wt 196 lb 6.4 oz (89.1 kg)   SpO2 97%   BMI 32.68 kg/m     Wt Readings from Last 3 Encounters:  07/24/19 196 lb 6.4 oz (89.1 kg)  03/27/19 190 lb (86.2 kg)  07/04/18 199 lb 3.2 oz (90.4 kg)     GEN: Mild obesity. No acute distress HEENT: Normal NECK: No JVD. LYMPHATICS: No lymphadenopathy CARDIAC:  RRR with soft short duration right upper sternal systolic but no diastolic murmur.  No gallop, or edema. VASCULAR:  Normal Pulses. No bruits. RESPIRATORY:  Clear to auscultation without rales, wheezing or rhonchi  ABDOMEN: Soft, non-tender, non-distended, No pulsatile mass, MUSCULOSKELETAL: No deformity  SKIN: Warm and dry NEUROLOGIC:  Alert and oriented x 3 PSYCHIATRIC:  Normal affect   ASSESSMENT:    1. Coronary artery disease involving bypass graft of transplanted heart without angina pectoris   2. Essential hypertension, benign   3. Pure hypercholesterolemia   4. Type 2 diabetes mellitus with peripheral artery disease ([place])   5. PVD (peripheral vascular disease) ([place])   6. Educated About Covid-19 Virus Infection    PLAN:    In order of problems listed above:  1. Stable angina occurring mostly in cold weather.  No  nitroglycerin use since last summer.  Remains active and able to perform all physical duties required in his lifestyle as reported 1 year ago.  Secondary prevention is discussed in detail. 2. 130/80 mmHg or less 3. Target LDL less than 70.  Recent blood work demonstrated an LDL of 68. 4. Hemoglobin A1c less than 7, target is being achieved. 5. Denies claudication. 6. Social distancing, mask wearing, and handwashing is stressed.  Overall education and awareness concerning primary/secondary risk prevention was discussed in detail: LDL less than 70, hemoglobin A1c less than 7, blood pressure target less than 130/80 mmHg, >150 minutes of moderate aerobic activity per week, avoidance of smoking, weight control (via diet and exercise), and continued surveillance/management of/for obstructive sleep apnea.    Medication Adjustments/Labs and Tests Ordered: Current medicines are reviewed at length with the patient today.  Concerns regarding medicines are outlined above.  Orders Placed This Encounter  Procedures  . EKG 12-Lead   No orders of the defined types were placed in this encounter.   Patient Instructions  Medication Instructions:  Your physician recommends that you continue on your current medications as directed. Please refer to the Current Medication list given to you today.  If you need a refill on your cardiac medications before your next appointment, please call your pharmacy.   Lab work: None If you have labs (blood work) drawn today and your tests are completely normal, you will receive your results only by: Marland Kitchen MyChart Message (if you have MyChart) OR . A paper copy in the mail If you have any lab test that is abnormal or we need to change your treatment, we will call you to review the results.  Testing/Procedures: None  Follow-Up: At [facility], you and your health needs are our priority.  As part of our continuing mission to provide you with exceptional heart care, we  have created designated Provider Care Teams.  These Care Teams  include your primary Cardiologist (physician) and Advanced Practice Providers (APPs -  Physician Assistants and Nurse Practitioners) who all work together to provide you with the care you need, when you need it. You will need a follow up appointment in 12 months.  Please call our office 2 months in advance to schedule this appointment.  You may see Lesleigh Noe, MD or one of the following Advanced Practice Providers on your designated Care Team:   Norma Fredrickson, NP Nada Boozer, NP . Georgie Chard, NP  Any Other Special Instructions Will Be Listed Below (If Applicable).       Signed, Lesleigh Noe, MD  07/24/2019 12:02 PM    [facility]

## 2019-07-24 ENCOUNTER — Other Ambulatory Visit: Payer: Self-pay

## 2019-07-24 ENCOUNTER — Encounter: Payer: Self-pay | Admitting: Interventional Cardiology

## 2019-07-24 ENCOUNTER — Ambulatory Visit: Payer: PPO | Admitting: Interventional Cardiology

## 2019-07-24 ENCOUNTER — Encounter (INDEPENDENT_AMBULATORY_CARE_PROVIDER_SITE_OTHER): Payer: Self-pay

## 2019-07-24 VITALS — BP 118/68 | HR 54 | Ht 65.0 in | Wt 196.4 lb

## 2019-07-24 DIAGNOSIS — I25812 Atherosclerosis of bypass graft of coronary artery of transplanted heart without angina pectoris: Secondary | ICD-10-CM | POA: Diagnosis not present

## 2019-07-24 DIAGNOSIS — E1151 Type 2 diabetes mellitus with diabetic peripheral angiopathy without gangrene: Secondary | ICD-10-CM

## 2019-07-24 DIAGNOSIS — Z7189 Other specified counseling: Secondary | ICD-10-CM

## 2019-07-24 DIAGNOSIS — I10 Essential (primary) hypertension: Secondary | ICD-10-CM | POA: Diagnosis not present

## 2019-07-24 DIAGNOSIS — E7800 Pure hypercholesterolemia, unspecified: Secondary | ICD-10-CM

## 2019-07-24 DIAGNOSIS — I739 Peripheral vascular disease, unspecified: Secondary | ICD-10-CM | POA: Diagnosis not present

## 2019-07-24 NOTE — Patient Instructions (Signed)

## 2019-07-26 DIAGNOSIS — E538 Deficiency of other specified B group vitamins: Secondary | ICD-10-CM | POA: Diagnosis not present

## 2019-08-28 DIAGNOSIS — E538 Deficiency of other specified B group vitamins: Secondary | ICD-10-CM | POA: Diagnosis not present

## 2019-09-28 DIAGNOSIS — E538 Deficiency of other specified B group vitamins: Secondary | ICD-10-CM | POA: Diagnosis not present

## 2019-10-24 DIAGNOSIS — E119 Type 2 diabetes mellitus without complications: Secondary | ICD-10-CM | POA: Diagnosis not present

## 2019-10-24 DIAGNOSIS — I10 Essential (primary) hypertension: Secondary | ICD-10-CM | POA: Diagnosis not present

## 2019-10-24 DIAGNOSIS — N400 Enlarged prostate without lower urinary tract symptoms: Secondary | ICD-10-CM | POA: Diagnosis not present

## 2019-10-24 DIAGNOSIS — E7800 Pure hypercholesterolemia, unspecified: Secondary | ICD-10-CM | POA: Diagnosis not present

## 2019-10-24 DIAGNOSIS — E039 Hypothyroidism, unspecified: Secondary | ICD-10-CM | POA: Diagnosis not present

## 2019-10-24 DIAGNOSIS — E782 Mixed hyperlipidemia: Secondary | ICD-10-CM | POA: Diagnosis not present

## 2019-10-24 DIAGNOSIS — I2510 Atherosclerotic heart disease of native coronary artery without angina pectoris: Secondary | ICD-10-CM | POA: Diagnosis not present

## 2019-10-24 DIAGNOSIS — E1151 Type 2 diabetes mellitus with diabetic peripheral angiopathy without gangrene: Secondary | ICD-10-CM | POA: Diagnosis not present

## 2019-11-01 DIAGNOSIS — E538 Deficiency of other specified B group vitamins: Secondary | ICD-10-CM | POA: Diagnosis not present

## 2019-12-04 DIAGNOSIS — E538 Deficiency of other specified B group vitamins: Secondary | ICD-10-CM | POA: Diagnosis not present

## 2019-12-12 DIAGNOSIS — E7800 Pure hypercholesterolemia, unspecified: Secondary | ICD-10-CM | POA: Diagnosis not present

## 2019-12-12 DIAGNOSIS — I10 Essential (primary) hypertension: Secondary | ICD-10-CM | POA: Diagnosis not present

## 2019-12-12 DIAGNOSIS — E039 Hypothyroidism, unspecified: Secondary | ICD-10-CM | POA: Diagnosis not present

## 2019-12-12 DIAGNOSIS — I2510 Atherosclerotic heart disease of native coronary artery without angina pectoris: Secondary | ICD-10-CM | POA: Diagnosis not present

## 2019-12-12 DIAGNOSIS — R413 Other amnesia: Secondary | ICD-10-CM | POA: Diagnosis not present

## 2019-12-12 DIAGNOSIS — E1151 Type 2 diabetes mellitus with diabetic peripheral angiopathy without gangrene: Secondary | ICD-10-CM | POA: Diagnosis not present

## 2020-01-04 DIAGNOSIS — E538 Deficiency of other specified B group vitamins: Secondary | ICD-10-CM | POA: Diagnosis not present

## 2020-01-11 ENCOUNTER — Encounter: Payer: Self-pay | Admitting: *Deleted

## 2020-01-15 ENCOUNTER — Other Ambulatory Visit: Payer: Self-pay

## 2020-01-15 ENCOUNTER — Encounter: Payer: Self-pay | Admitting: Diagnostic Neuroimaging

## 2020-01-15 ENCOUNTER — Ambulatory Visit: Payer: PPO | Admitting: Diagnostic Neuroimaging

## 2020-01-15 ENCOUNTER — Telehealth: Payer: Self-pay | Admitting: Diagnostic Neuroimaging

## 2020-01-15 VITALS — BP 125/71 | HR 49 | Temp 97.0°F | Ht 66.0 in | Wt 191.0 lb

## 2020-01-15 DIAGNOSIS — R413 Other amnesia: Secondary | ICD-10-CM | POA: Diagnosis not present

## 2020-01-15 NOTE — Telephone Encounter (Signed)
Health team order sent to GI. No auth they will reach out to the patient to schedule.  

## 2020-01-15 NOTE — Patient Instructions (Signed)
MEMORY LOSS (suspect mild dementia) - check MRI brain  - consider memantine 10mg  at bedtime; increase to twice a day after 1-2 weeks - safety / supervision issues reviewed - caregiver resources provided - no driving; caution with finances

## 2020-01-15 NOTE — Progress Notes (Signed)
[facility]  PATIENT: [name] [name] DOB: 02/01/1941  REFERRING CLINICIAN: Seward Carol, MD HISTORY FROM: patient and wife  REASON FOR VISIT: new consult    HISTORICAL  CHIEF COMPLAINT:  Chief Complaint  Patient presents with  . Memory Loss    rm 7 New Pt, wifeZella [name]   MMSE 23    HISTORY OF PRESENT ILLNESS:   79 year old male here for evaluation of memory loss.  Patient reports some mild memory difficulty but he does not think these are that significant.  Wife is noted at least 2 years of gradual onset progressive short-term memory loss, repeating self, difficulty paying bills, difficulty driving and shopping alone.  Patient's wife has to supervise him for these issues.  She helps him with medication.  Symptoms are gradual progressive over time.  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  He graduated from high school.  He has had loss of sense of smell for many years.  Before the pandemic he and his wife were exercising 5 days a week.  They both received Covid vaccines, and are looking forward to rejoining [employer] soon.    REVIEW OF SYSTEMS: Full 14 system review of systems performed and negative with exception of: As per HPI.  ALLERGIES: Allergies  Allergen Reactions  . Spironolactone Rash  . Penicillins Rash    Has patient had a PCN reaction causing immediate rash, facial/tongue/throat swelling, SOB or lightheadedness with hypotension:no Has patient had a PCN reaction causing severe rash involving mucus membranes or skin necrosis: no Has patient had a PCN reaction that required hospitalization no Has patient had a PCN reaction occurring within the last 10 years: no If all of the above answers are "NO", then may proceed with Cephalosporin use.     HOME MEDICATIONS: Outpatient Medications Prior to Visit  Medication Sig Dispense Refill  . amLODipine (NORVASC) 5 MG tablet Take 5 mg by mouth daily.    Marland Kitchen aspirin EC 81  MG tablet Take 1 tablet (81 mg total) by mouth daily. 90 tablet 3  . atorvastatin (LIPITOR) 80 MG tablet Take 40 mg by mouth every morning.     . Cyanocobalamin (B-12 IJ) Use as directed monthly.    . finasteride (PROSCAR) 5 MG tablet Take 5 mg by mouth every morning.     . Flaxseed, Linseed, (FLAX SEEDS PO) Take 1 capsule by mouth daily.    . hydrochlorothiazide (HYDRODIURIL) 25 MG tablet Take 25 mg by mouth every morning.     Marland Kitchen levothyroxine (SYNTHROID, LEVOTHROID) 100 MCG tablet Take 100 mcg by mouth daily before breakfast.    . lisinopril (PRINIVIL,ZESTRIL) 40 MG tablet Take 40 mg by mouth every morning.     . metFORMIN (GLUCOPHAGE) 500 MG tablet Take 500 mg by mouth 2 (two) times daily with a meal.    . metoprolol succinate (TOPROL-XL) 50 MG 24 hr tablet Take 50 mg by mouth every morning. Take with or immediately following a meal.    . nitroGLYCERIN (NITROSTAT) 0.4 MG SL tablet DISSOLVE ONE TABLET UNDER THE TONGUE EVERY 5 MINUTES AS NEEDED FOR CHEST PAIN 25 tablet 5  . potassium chloride SA (K-DUR,KLOR-CON) 20 MEQ tablet Take 10 mEq by mouth every morning.     . tamsulosin (FLOMAX) 0.4 MG CAPS capsule Take 0.4 mg by mouth daily.    Marland Kitchen senna-docusate (SENOKOT-S) 8.6-50 MG tablet Take 1 tablet by mouth 2 (two) times daily. While taking pain meds to prevent  constipation 30 tablet 0   No facility-administered medications prior to visit.    PAST MEDICAL HISTORY: Past Medical History:  Diagnosis Date  . Benign prostatic hypertrophy with lower urinary tract symptoms (LUTS)   . Bilateral edema of lower extremity    IMPROVED W/ TED HOSE  . CAD (coronary artery disease)    cardiologist-  dr Verdis Prime--  s/p  cabg x6 in 1998  . Heart murmur   . Hiatal hernia   . History of kidney stones   . Hydronephrosis, left   . Hyperlipidemia   . Hypertension   . Hypothyroidism   . Left ureteral stone   . Memory difficulties   . Nephrolithiasis    right side per ct  non-obstructive  . PVD  (peripheral vascular disease) (HCC)   . RBBB (right bundle branch block)   . 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 Sigmoid diverticulosis   . Sinus bradycardia   . Type 2 diabetes mellitus (HCC)     PAST SURGICAL HISTORY: Past Surgical History:  Procedure Laterality Date  . CATARACT EXTRACTION W/ INTRAOCULAR LENS  IMPLANT, BILATERAL  2011  . CORONARY ARTERY BYPASS GRAFT  1998   CABG X 6 --   LIMA  to the LAD and  other undefined SVG's  . CYSTOSCOPY WITH BIOPSY N/A 11/29/2015   Procedure: CYSTOSCOPY WITH BIOPSY AND FULGERATION;  Surgeon: Sebastian Ache, MD;  Location: [facility];  Service: Urology;  Laterality: N/A;  . CYSTOSCOPY/RETROGRADE/URETEROSCOPY/STONE EXTRACTION WITH BASKET Left 11/29/2015   Procedure: CYSTOSCOPY/RETROGRADE/DIAGNOSTIC URETEROSCOPY/STONE EXTRACTION;  Surgeon: Sebastian Ache, MD;  Location: [facility];  Service: Urology;  Laterality: Left;  . EXTRACORPOREAL SHOCK WAVE LITHOTRIPSY Left 08-12-2015  . LEFT HEART CATHETERIZATION WITH CORONARY/GRAFT ANGIOGRAM  06/16/2013   Procedure: LEFT HEART CATHETERIZATION WITH Isabel Caprice;  Surgeon: Lesleigh Noe, MD;  [facility] CATH LAB;  Service: Cardiovascular; Native vessel occlusive disease w/ ostial total occlusion RCA, dLM, & heavy calicification LM LAD CX RCA/  SVG to diagonal patent, SVG to marginal 1 patent & 2 occluded, LIMA to dLAD patent/ dCFX collateral from obtuse marginal graft/  normal LVF  . TRANSTHORACIC ECHOCARDIOGRAM  05-23-2015  dr Sherilyn Cooter smith   mild LVH,  grade 1 diastolic dysfunction,  ef 55-060%,  mild AV calcifacation without stenosis,  mild AR and MR,  moderate LAE,  trivial PR and TR,  mild increase pulmonary pressure    FAMILY HISTORY: Family History  Problem Relation Age of Onset  . Tuberculosis Mother   . CAD Father   . Heart disease Sister     SOCIAL HISTORY: Social History   Socioeconomic History  . Marital status: Married    Spouse name: [name]  . Number  of children: 2  . Years of education: 86  . Highest education level: Not on file  Occupational History    Comment: retired  Tobacco Use  . Smoking status: Former Smoker    Years: 10.00    Types: Cigarettes    Quit date: 10/26/1966    Years since quitting: 53.2  . Smokeless tobacco: Never Used  Substance and Sexual Activity  . Alcohol use: No    Alcohol/week: 0.0 standard drinks  . Drug use: No  . Sexual activity: Not on file  Other Topics Concern  . Not on file  Social History Narrative  . Not on file   Social Determinants of Health   Financial Resource Strain:   . Difficulty of Paying  Living Expenses:   Food Insecurity:   . Worried About Programme researcher, broadcasting/film/video in the Last Year:   . Barista in the Last Year:   Transportation Needs:   . Freight forwarder (Medical):   Marland Kitchen Lack of Transportation (Non-Medical):   Physical Activity:   . Days of Exercise per Week:   . Minutes of Exercise per Session:   Stress:   . Feeling of Stress :   Social Connections:   . Frequency of Communication with Friends and Family:   . Frequency of Social Gatherings with Friends and Family:   . Attends Religious Services:   . Active Member of Clubs or Organizations:   . Attends Banker Meetings:   Marland Kitchen Marital Status:   Intimate Partner Violence:   . Fear of Current or Ex-Partner:   . Emotionally Abused:   Marland Kitchen Physically Abused:   . Sexually Abused:      PHYSICAL EXAM  GENERAL EXAM/CONSTITUTIONAL: Vitals:  Vitals:   01/15/20 0940  BP: 125/71  Pulse: (!) 49  Temp: (!) 97 F (36.1 C)  Weight: 191 lb (86.6 kg)  Height: 5\' 6"  (1.676 m)     Body mass index is 30.83 kg/m. Wt Readings from Last 3 Encounters:  01/15/20 191 lb (86.6 kg)  07/24/19 196 lb 6.4 oz (89.1 kg)  03/27/19 190 lb (86.2 kg)     Patient is in no distress; well developed, nourished and groomed; neck is supple  CARDIOVASCULAR:  Examination of carotid arteries is normal; no carotid  bruits  Regular rate and rhythm, no murmurs  Examination of peripheral vascular system by observation and palpation is normal  EYES:  Ophthalmoscopic exam of optic discs and posterior segments is normal; no papilledema or hemorrhages  No exam data present  MUSCULOSKELETAL:  Gait, strength, tone, movements noted in Neurologic exam below  NEUROLOGIC: MENTAL STATUS:  MMSE - Mini Mental State Exam 01/15/2020  Orientation to time 4  Orientation to Place 4  Registration 3  Attention/ Calculation 1  Recall 2  Language- name 2 objects 2  Language- repeat 1  Language- follow 3 step command 3  Language- read & follow direction 1  Write a sentence 1  Copy design 1  Total score 23    awake, alert, oriented to person, place and time  recent and remote memory intact  DECR attention and concentration  language fluent, comprehension intact, naming intact  fund of knowledge appropriate  AFT 13  FLAT AFFECT  CRANIAL NERVE:   2nd - no papilledema on fundoscopic exam  2nd, 3rd, 4th, 6th - pupils equal and reactive to light, visual fields full to confrontation, extraocular muscles intact, no nystagmus  5th - facial sensation symmetric  7th - facial strength symmetric  8th - hearing intact  9th - palate elevates symmetrically, uvula midline  11th - shoulder shrug symmetric  12th - tongue protrusion midline  MOTOR:   normal bulk and tone, full strength in the BUE, BLE  SENSORY:   normal and symmetric to light touch, temperature, vibration  COORDINATION:   finger-nose-finger, fine finger movements normal  REFLEXES:   deep tendon reflexes TRACE and symmetric  GAIT/STATION:   narrow based gait     DIAGNOSTIC DATA (LABS, IMAGING, TESTING) - I reviewed patient records, labs, notes, testing and imaging myself where available.  Lab Results  Component Value Date   WBC 8.3 05/10/2015   HGB 15.3 11/29/2015   HCT 45.0 11/29/2015   MCV  87.2 05/10/2015    PLT 203 05/10/2015      Component Value Date/Time   NA 141 06/29/2017 1041   K 4.9 06/29/2017 1041   CL 101 06/29/2017 1041   CO2 23 06/29/2017 1041   GLUCOSE 94 06/29/2017 1041   GLUCOSE 105 (H) 11/29/2015 1031   BUN 17 06/29/2017 1041   CREATININE 0.86 06/29/2017 1041   CALCIUM 9.7 06/29/2017 1041   GFRNONAA 84 06/29/2017 1041   GFRAA 97 06/29/2017 1041   No results found for: CHOL, HDL, LDLCALC, LDLDIRECT, TRIG, CHOLHDL No results found for: ZOXW9U No results found for: VITAMINB12 No results found for: TSH    ASSESSMENT AND PLAN  79 y.o. year old male here with gradual onset progressive short-term memory loss, decline in ADLs, repeating himself, since 2019.  MMSE 23 out of 30.  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  Dx:  1. Memory loss     PLAN:  MEMORY LOSS (suspect mild dementia) - check MRI brain  - consider memantine 10mg  at bedtime; increase to twice a day after 1-2 weeks - safety / supervision issues reviewed - caregiver resources provided - no driving; caution with finances  Orders Placed This Encounter  Procedures  . MR BRAIN W WO CONTRAST   Return pending test results, for pending if symptoms worsen or fail to improve.    , MD 01/15/2020, 10:21 AM Certified in Neurology, Neurophysiology and Neuroimaging  [facility] [address] [place], [place] [phone]

## 2020-02-06 DIAGNOSIS — R3912 Poor urinary stream: Secondary | ICD-10-CM | POA: Diagnosis not present

## 2020-02-06 DIAGNOSIS — N200 Calculus of kidney: Secondary | ICD-10-CM | POA: Diagnosis not present

## 2020-02-06 DIAGNOSIS — N476 Balanoposthitis: Secondary | ICD-10-CM | POA: Diagnosis not present

## 2020-02-07 DIAGNOSIS — E538 Deficiency of other specified B group vitamins: Secondary | ICD-10-CM | POA: Diagnosis not present

## 2020-02-10 ENCOUNTER — Ambulatory Visit
Admission: RE | Admit: 2020-02-10 | Discharge: 2020-02-10 | Disposition: A | Discharge status: Home / Self Care | Payer: PPO | Source: Ambulatory Visit | Attending: Diagnostic Neuroimaging | Admitting: Diagnostic Neuroimaging

## 2020-02-10 ENCOUNTER — Other Ambulatory Visit: Payer: Self-pay

## 2020-02-10 DIAGNOSIS — R413 Other amnesia: Secondary | ICD-10-CM | POA: Diagnosis not present

## 2020-02-10 MED ORDER — GADOBENATE DIMEGLUMINE 529 MG/ML IV SOLN
17.0000 mL | Freq: Once | INTRAVENOUS | Status: AC | PRN
  Administered 2020-02-10: 17 mL via INTRAVENOUS

## 2020-02-12 ENCOUNTER — Telehealth: Payer: Self-pay | Admitting: *Deleted

## 2020-02-12 NOTE — Telephone Encounter (Addendum)
Spoke with wife on [place] and informed her the patient's MRI brain results are unremarkable, no acute findings. I reviewed Dr Richrd Humbles plan per office note; she would like MD to prescribe memantine. I reviewed Dr Richrd Humbles directions for taking it, advised I;ll let him know she would like the medication prescribed. She verbalized understanding, appreciation.

## 2020-02-15 ENCOUNTER — Telehealth: Payer: Self-pay | Admitting: *Deleted

## 2020-02-15 MED ORDER — MEMANTINE HCL 10 MG PO TABS: 10.0000 mg | ORAL_TABLET | Freq: Two times a day (BID) | ORAL | 12 refills | Status: DC

## 2020-02-15 NOTE — Telephone Encounter (Signed)
Meds ordered this encounter  Medications  . memantine (NAMENDA) 10 MG tablet    Sig: Take 1 tablet (10 mg total) by mouth 2 (two) times daily.    Dispense:  60 tablet    Refill:  12   Suanne Marker, MD 02/15/2020, 12:20 PM Certified in Neurology, Neurophysiology and Neuroimaging  [facility] [address], [address] [place], [place] [address] [phone]

## 2020-02-15 NOTE — Telephone Encounter (Signed)
error 

## 2020-02-15 NOTE — Addendum Note (Signed)
Addended by: Joycelyn Schmid R on: 02/15/2020 12:20 PM   Modules accepted: Orders

## 2020-02-15 NOTE — Telephone Encounter (Signed)
LVM informing wife Dr Marjory Lies sent in the new prescription. Left # for questions.

## 2020-02-15 NOTE — Telephone Encounter (Signed)
Pt wife called to check on the status of the medication being prescribed for pt, states she hasnt received a CB

## 2020-03-11 DIAGNOSIS — E538 Deficiency of other specified B group vitamins: Secondary | ICD-10-CM | POA: Diagnosis not present

## 2020-03-18 ENCOUNTER — Ambulatory Visit
Admission: RE | Admit: 2020-03-18 | Discharge: 2020-03-18 | Disposition: A | Discharge status: Home / Self Care | Payer: PPO | Source: Ambulatory Visit | Attending: Internal Medicine | Admitting: Internal Medicine

## 2020-03-18 ENCOUNTER — Other Ambulatory Visit: Payer: Self-pay | Admitting: Internal Medicine

## 2020-03-18 DIAGNOSIS — M25512 Pain in left shoulder: Secondary | ICD-10-CM | POA: Diagnosis not present

## 2020-03-18 DIAGNOSIS — S4992XA Unspecified injury of left shoulder and upper arm, initial encounter: Secondary | ICD-10-CM | POA: Diagnosis not present

## 2020-03-18 DIAGNOSIS — M25522 Pain in left elbow: Secondary | ICD-10-CM | POA: Diagnosis not present

## 2020-04-12 ENCOUNTER — Other Ambulatory Visit: Payer: Self-pay | Admitting: *Deleted

## 2020-04-12 DIAGNOSIS — I6523 Occlusion and stenosis of bilateral carotid arteries: Secondary | ICD-10-CM

## 2020-04-12 DIAGNOSIS — E538 Deficiency of other specified B group vitamins: Secondary | ICD-10-CM | POA: Diagnosis not present

## 2020-04-12 DIAGNOSIS — I872 Venous insufficiency (chronic) (peripheral): Secondary | ICD-10-CM

## 2020-04-22 ENCOUNTER — Other Ambulatory Visit: Payer: Self-pay

## 2020-04-22 ENCOUNTER — Ambulatory Visit (INDEPENDENT_AMBULATORY_CARE_PROVIDER_SITE_OTHER)
Admission: RE | Admit: 2020-04-22 | Discharge: 2020-04-22 | Disposition: A | Discharge status: Home / Self Care | Payer: PPO | Source: Ambulatory Visit | Attending: Surgery | Admitting: Surgery

## 2020-04-22 ENCOUNTER — Ambulatory Visit (HOSPITAL_COMMUNITY)
Admission: RE | Admit: 2020-04-22 | Discharge: 2020-04-22 | Disposition: A | Discharge status: Home / Self Care | Payer: PPO | Source: Ambulatory Visit | Attending: Surgery | Admitting: Surgery

## 2020-04-22 ENCOUNTER — Ambulatory Visit (INDEPENDENT_AMBULATORY_CARE_PROVIDER_SITE_OTHER): Payer: PPO | Admitting: Physician Assistant

## 2020-04-22 VITALS — BP 155/61 | HR 45 | Temp 97.7°F | Resp 20 | Ht 66.0 in | Wt 186.7 lb

## 2020-04-22 DIAGNOSIS — I739 Peripheral vascular disease, unspecified: Secondary | ICD-10-CM | POA: Diagnosis not present

## 2020-04-22 DIAGNOSIS — I872 Venous insufficiency (chronic) (peripheral): Secondary | ICD-10-CM | POA: Diagnosis not present

## 2020-04-22 DIAGNOSIS — I6523 Occlusion and stenosis of bilateral carotid arteries: Secondary | ICD-10-CM

## 2020-04-22 NOTE — Progress Notes (Signed)
Office Note     CC:  follow up Requesting Provider:  Renford Dills, MD  HPI: [name] [name] is a 79 y.o. (19-Jun-1941) male who presents for routine follow up of chronic venous insufficiency, PAD and carotid artery stenosis. He is overall doing well. He denies any amaurosis, slurred speech, facial drooping, weakness or numbness of upper of lower extremities. He does not have any claudication, rest pain or non healing wounds. He has not been quite as active with his gym being closed last year due to COVID, but he stays active around the house. He continues to [employer] with a push mower and has no lower extremity symptoms. He does still get swelling in his legs intermittently but this is resolved with use of knee high compression stockings that he wears daily.  He was recently diagnosed with early stages of Dementia per his wife  His PMx, Family Hx, and Social Hx were reviewed and are unchanged from his prior visit in June of 2020  The pt is on a statin for cholesterol management.  The pt is on a daily aspirin.   Other AC:  none The pt is on BB, CCB, HCTZ, ACE for hypertension.   The pt is diabetic.   Tobacco hx: former, quit 1964  Past Medical History:  Diagnosis Date  . Benign prostatic hypertrophy with lower urinary tract symptoms (LUTS)   . Bilateral edema of lower extremity    IMPROVED W/ TED HOSE  . CAD (coronary artery disease)    cardiologist-  dr Verdis Prime--  s/p  cabg x6 in 1998  . Heart murmur   . Hiatal hernia   . History of kidney stones   . Hydronephrosis, left   . Hyperlipidemia   . Hypertension   . Hypothyroidism   . Left ureteral stone   . Memory difficulties   . Nephrolithiasis    right side per ct  non-obstructive  . PVD (peripheral vascular disease) (HCC)   . RBBB (right bundle branch block)   . 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 Sigmoid diverticulosis   . Sinus bradycardia   . Type 2 diabetes mellitus (HCC)     Past Surgical History:  Procedure Laterality Date   . CATARACT EXTRACTION W/ INTRAOCULAR LENS  IMPLANT, BILATERAL  2011  . CORONARY ARTERY BYPASS GRAFT  1998   CABG X 6 --   LIMA  to the LAD and  other undefined SVG's  . CYSTOSCOPY WITH BIOPSY N/A 11/29/2015   Procedure: CYSTOSCOPY WITH BIOPSY AND FULGERATION;  Surgeon: Sebastian Ache, MD;  Location: [facility];  Service: Urology;  Laterality: N/A;  . CYSTOSCOPY/RETROGRADE/URETEROSCOPY/STONE EXTRACTION WITH BASKET Left 11/29/2015   Procedure: CYSTOSCOPY/RETROGRADE/DIAGNOSTIC URETEROSCOPY/STONE EXTRACTION;  Surgeon: Sebastian Ache, MD;  Location: [facility];  Service: Urology;  Laterality: Left;  . EXTRACORPOREAL SHOCK WAVE LITHOTRIPSY Left 08-12-2015  . LEFT HEART CATHETERIZATION WITH CORONARY/GRAFT ANGIOGRAM  06/16/2013   Procedure: LEFT HEART CATHETERIZATION WITH Isabel Caprice;  Surgeon: Lesleigh Noe, MD;  Location: [facility] CATH LAB;  Service: Cardiovascular; Native vessel occlusive disease w/ ostial total occlusion RCA, dLM, & heavy calicification LM LAD CX RCA/  SVG to diagonal patent, SVG to marginal 1 patent & 2 occluded, LIMA to dLAD patent/ dCFX collateral from obtuse marginal graft/  normal LVF  . TRANSTHORACIC ECHOCARDIOGRAM  05-23-2015  dr Sherilyn Cooter smith   mild LVH,  grade 1 diastolic dysfunction,  ef 55-060%,  mild AV calcifacation without stenosis,  mild AR and MR,  moderate LAE,  trivial PR and TR,  mild increase pulmonary pressure    Social History   Socioeconomic History  . Marital status: Married    Spouse name: [name]  . Number of children: 2  . Years of education: 51  . Highest education level: Not on file  Occupational History    Comment: retired  Tobacco Use  . Smoking status: Former Smoker    Years: 10.00    Types: Cigarettes    Quit date: 10/26/1966    Years since quitting: 53.5  . Smokeless tobacco: Never Used  Vaping Use  . Vaping Use: Never used  Substance and Sexual Activity  . Alcohol use: No    Alcohol/week: 0.0  standard drinks  . Drug use: No  . Sexual activity: Not on file  Other Topics Concern  . Not on file  Social History Narrative  . Not on file   Social Determinants of Health   Financial Resource Strain:   . Difficulty of Paying Living Expenses:   Food Insecurity:   . Worried About Programme researcher, broadcasting/film/video in the Last Year:   . Barista in the Last Year:   Transportation Needs:   . Freight forwarder (Medical):   Marland Kitchen Lack of Transportation (Non-Medical):   Physical Activity:   . Days of Exercise per Week:   . Minutes of Exercise per Session:   Stress:   . Feeling of Stress :   Social Connections:   . Frequency of Communication with Friends and Family:   . Frequency of Social Gatherings with Friends and Family:   . Attends Religious Services:   . Active Member of Clubs or Organizations:   . Attends Banker Meetings:   Marland Kitchen Marital Status:   Intimate Partner Violence:   . Fear of Current or Ex-Partner:   . Emotionally Abused:   Marland Kitchen Physically Abused:   . Sexually Abused:    Family History  Problem Relation Age of Onset  . Tuberculosis Mother   . CAD Father   . Heart disease Sister     Current Outpatient Medications  Medication Sig Dispense Refill  . amLODipine (NORVASC) 5 MG tablet Take 5 mg by mouth daily.    Marland Kitchen aspirin EC 81 MG tablet Take 1 tablet (81 mg total) by mouth daily. 90 tablet 3  . atorvastatin (LIPITOR) 80 MG tablet Take 40 mg by mouth every morning.     . Cyanocobalamin (B-12 IJ) Use as directed monthly.    . finasteride (PROSCAR) 5 MG tablet Take 5 mg by mouth every morning.     . Flaxseed, Linseed, (FLAX SEEDS PO) Take 1 capsule by mouth daily.    . hydrochlorothiazide (HYDRODIURIL) 25 MG tablet Take 25 mg by mouth every morning.     Marland Kitchen levothyroxine (SYNTHROID, LEVOTHROID) 100 MCG tablet Take 100 mcg by mouth daily before breakfast.    . lisinopril (PRINIVIL,ZESTRIL) 40 MG tablet Take 40 mg by mouth every morning.     . memantine  (NAMENDA) 10 MG tablet Take 1 tablet (10 mg total) by mouth 2 (two) times daily. 60 tablet 12  . metFORMIN (GLUCOPHAGE) 500 MG tablet Take 500 mg by mouth 2 (two) times daily with a meal.    . metoprolol succinate (TOPROL-XL) 50 MG 24 hr tablet Take 50 mg by mouth every morning. Take with or immediately following a meal.    . nitroGLYCERIN (NITROSTAT) 0.4 MG SL tablet DISSOLVE ONE  TABLET UNDER THE TONGUE EVERY 5 MINUTES AS NEEDED FOR CHEST PAIN 25 tablet 5  . potassium chloride SA (K-DUR,KLOR-CON) 20 MEQ tablet Take 10 mEq by mouth every morning.     . tamsulosin (FLOMAX) 0.4 MG CAPS capsule Take 0.4 mg by mouth daily.     No current facility-administered medications for this visit.    Allergies  Allergen Reactions  . Spironolactone Rash  . Penicillins Rash    Has patient had a PCN reaction causing immediate rash, facial/tongue/throat swelling, SOB or lightheadedness with hypotension:no Has patient had a PCN reaction causing severe rash involving mucus membranes or skin necrosis: no Has patient had a PCN reaction that required hospitalization no Has patient had a PCN reaction occurring within the last 10 years: no If all of the above answers are "NO", then may proceed with Cephalosporin use.      REVIEW OF SYSTEMS:  [X]  denotes positive finding, [ ]  denotes negative finding Cardiac  Comments:  Chest pain or chest pressure:    Shortness of breath upon exertion:    Short of breath when lying flat:    Irregular heart rhythm:        Vascular    Pain in calf, thigh, or hip brought on by ambulation:    Pain in feet at night that wakes you up from your sleep:     Blood clot in your veins:    Leg swelling:         Pulmonary    Oxygen at home:    Productive cough:     Wheezing:         Neurologic    Sudden weakness in arms or legs:     Sudden numbness in arms or legs:     Sudden onset of difficulty speaking or slurred speech:    Temporary loss of vision in one eye:     Problems  with dizziness:         Gastrointestinal    Blood in stool:     Vomited blood:         Genitourinary    Burning when urinating:     Blood in urine:        Psychiatric    Major depression:         Hematologic    Bleeding problems:    Problems with blood clotting too easily:        Skin    Rashes or ulcers:        Constitutional    Fever or chills:      PHYSICAL EXAMINATION:  Vitals:   04/22/20 1128 04/22/20 1134  BP: (!) 163/76 (!) 155/61  Pulse: (!) 45   Resp: 20   Temp: 97.7 F (36.5 C)   TempSrc: Temporal   SpO2: 98%   Weight: 186 lb 11.2 oz (84.7 kg)   Height: 5\' 6"  (1.676 m)     General:  WDWN in NAD; vital signs documented above Gait: Normal HENT: WNL, normocephalic Pulmonary: normal non-labored breathing , without Rales, rhonchi,  wheezing Cardiac: regular HR, without  Murmurs without carotid bruit Abdomen: soft, NT, no masses Vascular Exam/Pulses:  Right Left  Radial 2+ (normal) 2+ (normal)  Femoral 2+ (normal) 2+ (normal)  Popliteal Not palpable Not palpable  DP 1+ (weak) Not palpable  PT 2+ Not palpable   Extremities: without ischemic changes, without Gangrene , without cellulitis; without open wounds; bilateral feet warm. Motor and sensory intact. Bilateral lower extremity swelling. Trophic nail changes  bilaterally  Musculoskeletal: no muscle wasting or atrophy  Neurologic: A&O X 3;  No focal weakness or paresthesias are detected. CN intact Psychiatric:  The pt has Normal affect.   Non-Invasive Vascular Imaging:   04/22/20 +-------+-----------+-----------+------------+------------+  ABI/TBIToday's ABIToday's TBIPrevious ABIPrevious TBI  +-------+-----------+-----------+------------+------------+  Right [place]     0.82    [place]     0.62      +-------+-----------+-----------+------------+------------+  Left  [place]     0.94    [place]     1.61      +-------+-----------+-----------+------------+------------+    Absent dorsalis pedis on the left with monophasic flow in the PTA which has changed from prior study on 03/27/2019  Summary:  Right Carotid: Velocities in the right ICA are consistent with a 1-39% stenosis.   Left Carotid: Velocities in the left ICA are consistent with a 40-59% stenosis. Limited by acoustic shadowing.   Vertebrals: Bilateral vertebral arteries demonstrate antegrade flow.  Subclavians: Bilateral subclavian artery flow was disturbed.    ASSESSMENT/PLAN:: 79 y.o. male here for follow up for chronic venous insufficiency, PAD and carotid artery stenosis. He remains without symptoms regarding his PAD and Carotid stenosis. He has very minimal symptoms with regard to his venous insufficiency and this is resolved with conservative therapy. His non invasive studies show slight increase in left ICA stenosis in the 40-59% range. His bilateral ABI/TBI remain non compressible with slight decrease in the left TBI with absent DP waveform. - Encourage continued exercise therapy - He will continue to take his Aspirin and Statin - He continues to wear graded compression stockings daily on bilateral lower extremities  - Advised him to follow up earlier if he develops claudication symptoms, rest pain or non healing wounds - He will follow up in 1 year with carotid Duplex and ABIs   Graceann Congressorrina Kollen Armenti, PA-C [facility] [phone]  Clinic MD:  Myra GianottiBrabham

## 2020-04-24 DIAGNOSIS — E7800 Pure hypercholesterolemia, unspecified: Secondary | ICD-10-CM | POA: Diagnosis not present

## 2020-04-24 DIAGNOSIS — I2510 Atherosclerotic heart disease of native coronary artery without angina pectoris: Secondary | ICD-10-CM | POA: Diagnosis not present

## 2020-04-24 DIAGNOSIS — E039 Hypothyroidism, unspecified: Secondary | ICD-10-CM | POA: Diagnosis not present

## 2020-04-24 DIAGNOSIS — I10 Essential (primary) hypertension: Secondary | ICD-10-CM | POA: Diagnosis not present

## 2020-04-24 DIAGNOSIS — N400 Enlarged prostate without lower urinary tract symptoms: Secondary | ICD-10-CM | POA: Diagnosis not present

## 2020-04-24 DIAGNOSIS — E1151 Type 2 diabetes mellitus with diabetic peripheral angiopathy without gangrene: Secondary | ICD-10-CM | POA: Diagnosis not present

## 2020-04-24 DIAGNOSIS — E782 Mixed hyperlipidemia: Secondary | ICD-10-CM | POA: Diagnosis not present

## 2020-04-24 DIAGNOSIS — E119 Type 2 diabetes mellitus without complications: Secondary | ICD-10-CM | POA: Diagnosis not present

## 2020-05-13 DIAGNOSIS — E538 Deficiency of other specified B group vitamins: Secondary | ICD-10-CM | POA: Diagnosis not present

## 2020-05-14 DIAGNOSIS — H18413 Arcus senilis, bilateral: Secondary | ICD-10-CM | POA: Diagnosis not present

## 2020-05-14 DIAGNOSIS — H04123 Dry eye syndrome of bilateral lacrimal glands: Secondary | ICD-10-CM | POA: Diagnosis not present

## 2020-05-14 DIAGNOSIS — Z961 Presence of intraocular lens: Secondary | ICD-10-CM | POA: Diagnosis not present

## 2020-05-14 DIAGNOSIS — H02831 Dermatochalasis of right upper eyelid: Secondary | ICD-10-CM | POA: Diagnosis not present

## 2020-06-17 DIAGNOSIS — R413 Other amnesia: Secondary | ICD-10-CM | POA: Diagnosis not present

## 2020-06-21 NOTE — Progress Notes (Signed)
Cardiology Office Note:    Date:  06/26/2020   ID:  JI FELDNER, DOB [birth year], MRN [id number]  PCP:  Renford Dills, MD  Cardiologist:  Lesleigh Noe, MD   Referring MD: Renford Dills, MD   Chief Complaint  Patient presents with  . Coronary Artery Disease  . Hypertension  . Hyperlipidemia    History of Present Illness:    [name] [name] is a 79 y.o. male with a hx of CAD, history of coronary artery bypass grafting, type 2 diabetes, peripheral arterial disease, essential hypertension, and diabetes mellitus.  He is still able to mow his grass.  He prefers to do it in the heat of the day when it is humid.  This is not a good idea.  He has angina when this happens.  He has to use nitroglycerin.  His wife complains about it.  We discussed the imperative that he do it early in the morning, late in afternoon, or breaks it up into a several day event.    He denies unprovoked angina.  He is not having orthopnea.  He has not had episodes of syncope or arrhythmia.  He does have lower extremity edema but it is been under good control.  Past Medical History:  Diagnosis Date  . Benign prostatic hypertrophy with lower urinary tract symptoms (LUTS)   . Bilateral edema of lower extremity    IMPROVED W/ TED HOSE  . CAD (coronary artery disease)    cardiologist-  dr Verdis Prime--  s/p  cabg x6 in 1998  . Heart murmur   . Hiatal hernia   . History of kidney stones   . Hydronephrosis, left   . Hyperlipidemia   . Hypertension   . Hypothyroidism   . Left ureteral stone   . Memory difficulties   . Nephrolithiasis    right side per ct  non-obstructive  . PVD (peripheral vascular disease) (HCC)   . RBBB (right bundle branch block)   . 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 Sigmoid diverticulosis   . Sinus bradycardia   . Type 2 diabetes mellitus (HCC)     Past Surgical History:  Procedure Laterality Date  . CATARACT EXTRACTION W/ INTRAOCULAR LENS  IMPLANT, BILATERAL  2011  . CORONARY ARTERY  BYPASS GRAFT  1998   CABG X 6 --   LIMA  to the LAD and  other undefined SVG's  . CYSTOSCOPY WITH BIOPSY N/A 11/29/2015   Procedure: CYSTOSCOPY WITH BIOPSY AND FULGERATION;  Surgeon: Sebastian Ache, MD;  Location: [facility];  Service: Urology;  Laterality: N/A;  . CYSTOSCOPY/RETROGRADE/URETEROSCOPY/STONE EXTRACTION WITH BASKET Left 11/29/2015   Procedure: CYSTOSCOPY/RETROGRADE/DIAGNOSTIC URETEROSCOPY/STONE EXTRACTION;  Surgeon: Sebastian Ache, MD;  Location: [facility];  Service: Urology;  Laterality: Left;  . EXTRACORPOREAL SHOCK WAVE LITHOTRIPSY Left 08-12-2015  . LEFT HEART CATHETERIZATION WITH CORONARY/GRAFT ANGIOGRAM  06/16/2013   Procedure: LEFT HEART CATHETERIZATION WITH Isabel Caprice;  Surgeon: Lesleigh Noe, MD;  Location: [facility] CATH LAB;  Service: Cardiovascular; Native vessel occlusive disease w/ ostial total occlusion RCA, dLM, & heavy calicification LM LAD CX RCA/  SVG to diagonal patent, SVG to marginal 1 patent & 2 occluded, LIMA to dLAD patent/ dCFX collateral from obtuse marginal graft/  normal LVF  . TRANSTHORACIC ECHOCARDIOGRAM  05-23-2015  dr Sherilyn Cooter Kaycen Whitworth   mild LVH,  grade 1 diastolic dysfunction,  ef 55-060%,  mild AV calcifacation without stenosis,  mild AR and MR,  moderate  LAE,  trivial PR and TR,  mild increase pulmonary pressure    Current Medications: Current Meds  Medication Sig  . amLODipine (NORVASC) 5 MG tablet Take 5 mg by mouth daily.  Marland Kitchen. aspirin EC 81 MG tablet Take 1 tablet (81 mg total) by mouth daily.  Marland Kitchen. atorvastatin (LIPITOR) 80 MG tablet Take 40 mg by mouth every morning.   . Cyanocobalamin (B-12 IJ) Use as directed monthly.  . finasteride (PROSCAR) 5 MG tablet Take 5 mg by mouth every morning.   . Flaxseed, Linseed, (FLAX SEEDS PO) Take 1 capsule by mouth daily.  . hydrochlorothiazide (HYDRODIURIL) 25 MG tablet Take 25 mg by mouth every morning.   Marland Kitchen. levothyroxine (SYNTHROID, LEVOTHROID) 100 MCG tablet Take 100 mcg  by mouth daily before breakfast.  . lisinopril (PRINIVIL,ZESTRIL) 40 MG tablet Take 40 mg by mouth every morning.   . memantine (NAMENDA) 10 MG tablet Take 1 tablet (10 mg total) by mouth 2 (two) times daily.  . metFORMIN (GLUCOPHAGE) 500 MG tablet Take 500 mg by mouth daily.   . nitroGLYCERIN (NITROSTAT) 0.4 MG SL tablet DISSOLVE ONE TABLET UNDER THE TONGUE EVERY 5 MINUTES AS NEEDED FOR CHEST PAIN  . potassium chloride SA (K-DUR,KLOR-CON) 20 MEQ tablet Take 10 mEq by mouth every morning.   . tamsulosin (FLOMAX) 0.4 MG CAPS capsule Take 0.4 mg by mouth daily.  . [DISCONTINUED] metoprolol succinate (TOPROL-XL) 50 MG 24 hr tablet Take 50 mg by mouth every morning. Take with or immediately following a meal.     Allergies:   Spironolactone and Penicillins   Social History   Socioeconomic History  . Marital status: Married    Spouse name: Riley Lamunice  . Number of children: 2  . Years of education: 6012  . Highest education level: Not on file  Occupational History    Comment: retired  Tobacco Use  . Smoking status: Former Smoker    Years: 10.00    Types: Cigarettes    Quit date: 10/26/1966    Years since quitting: 53.7  . Smokeless tobacco: Never Used  Vaping Use  . Vaping Use: Never used  Substance and Sexual Activity  . Alcohol use: No    Alcohol/week: 0.0 standard drinks  . Drug use: No  . Sexual activity: Not on file  Other Topics Concern  . Not on file  Social History Narrative  . Not on file   Social Determinants of Health   Financial Resource Strain:   . Difficulty of Paying Living Expenses: Not on file  Food Insecurity:   . Worried About Programme researcher, broadcasting/film/videounning Out of Food in the Last Year: Not on file  . Ran Out of Food in the Last Year: Not on file  Transportation Needs:   . Lack of Transportation (Medical): Not on file  . Lack of Transportation (Non-Medical): Not on file  Physical Activity:   . Days of Exercise per Week: Not on file  . Minutes of Exercise per Session: Not on file    Stress:   . Feeling of Stress : Not on file  Social Connections:   . Frequency of Communication with Friends and Family: Not on file  . Frequency of Social Gatherings with Friends and Family: Not on file  . Attends Religious Services: Not on file  . Active Member of Clubs or Organizations: Not on file  . Attends BankerClub or Organization Meetings: Not on file  . Marital Status: Not on file     Family History: The patient's family history  includes CAD in his father; Heart disease in his sister; Tuberculosis in his mother.  ROS:   Please see the history of present illness.    Memory difficulty.  Currently on Namenda.  His wife states that he wears himself on trying to cut the grass.  All other systems reviewed and are negative.  EKGs/Labs/Other Studies Reviewed:    The following studies were reviewed today: No new cardiac data.  EKG:  EKG sinus bradycardia 46 bpm.  Right bundle branch block, left axis deviation, small inferior Q waves, and when compared to the prior tracing from September 2020, no changes noted.  Recent Labs: No results found for requested labs within last 8760 hours.  Recent Lipid Panel No results found for: CHOL, TRIG, HDL, CHOLHDL, VLDL, LDLCALC, LDLDIRECT  Physical Exam:    VS:  BP 112/88   Pulse (!) 46   Ht 5\' 6"  (1.676 m)   Wt 189 lb 12.8 oz (86.1 kg)   SpO2 95%   BMI 30.63 kg/m     Wt Readings from Last 3 Encounters:  06/26/20 189 lb 12.8 oz (86.1 kg)  04/22/20 186 lb 11.2 oz (84.7 kg)  01/15/20 191 lb (86.6 kg)     GEN: Moderate obesity. No acute distress HEENT: Normal NECK: No JVD. LYMPHATICS: No lymphadenopathy CARDIAC:  RRR without murmur, gallop, or edema. VASCULAR:  Normal Pulses. No bruits. RESPIRATORY:  Clear to auscultation without rales, wheezing or rhonchi  ABDOMEN: Soft, non-tender, non-distended, No pulsatile mass, MUSCULOSKELETAL: No deformity  SKIN: Warm and dry NEUROLOGIC:  Alert and oriented x 3 PSYCHIATRIC:  Normal affect    ASSESSMENT:    1. Coronary artery disease involving bypass graft of transplanted heart without angina pectoris   2. Essential hypertension, benign   3. Slow heart rate   4. Type 2 diabetes mellitus with peripheral artery disease (HCC)   5. Pure hypercholesterolemia   6. PVD (peripheral vascular disease) (HCC)   7. Educated about COVID-19 virus infection    PLAN:    In order of problems listed above:  1. Secondary prevention discussed. 2. Blood pressure control may be too low.  Because of bradycardia I will decrease metoprolol succinate to 25 mg/day from 50 mg/day.  This will help to raise the blood pressure. 3. Decrease metoprolol succinate to 25 mg/day. 4. A1c and February was 6.1. 5. LDL cholesterol target is less than 70.  Most recent was 69 mg/dL.  Continue Lipitor 80 mg/day. 6. No symptoms of claudication. 7. He has been vaccinated, is willing to have a booster, and is practicing medication.   Medication Adjustments/Labs and Tests Ordered: Current medicines are reviewed at length with the patient today.  Concerns regarding medicines are outlined above.  Orders Placed This Encounter  Procedures  . EKG 12-Lead   Meds ordered this encounter  Medications  . metoprolol succinate (TOPROL-XL) 25 MG 24 hr tablet    Sig: Take 1 tablet (25 mg total) by mouth daily.    Dispense:  90 tablet    Refill:  3    Dose change    Patient Instructions  Medication Instructions:  1) DECREASE Metoprolol Succinate to 25mg  once daily.  Contact the office if chest pain or discomfort worsens with decreased dose.   *If you need a refill on your cardiac medications before your next appointment, please call your pharmacy*   Lab Work: None If you have labs (blood work) drawn today and your tests are completely normal, you will receive your results only  by: . MyChart Message (if you have MyChart) OR . A paper copy in the mail If you have any lab test that is abnormal or we need to change  your treatment, we will call you to review the results.   Testing/Procedures: None   Follow-Up: At [facility], you and your health needs are our priority.  As part of our continuing mission to provide you with exceptional heart care, we have created designated Provider Care Teams.  These Care Teams include your primary Cardiologist (physician) and Advanced Practice Providers (APPs -  Physician Assistants and Nurse Practitioners) who all work together to provide you with the care you need, when you need it.  We recommend signing up for the patient portal called "MyChart".  Sign up information is provided on this After Visit Summary.  MyChart is used to connect with patients for Virtual Visits (Telemedicine).  Patients are able to view lab/test results, encounter notes, upcoming appointments, etc.  Non-urgent messages can be sent to your provider as well.   To learn more about what you can do with MyChart, go to [email or web address].    Your next appointment:   12 month(s)  The format for your next appointment:   In Person  Provider:   You may see Lesleigh Noe, MD or one of the following Advanced Practice Providers on your designated Care Team:    Norma Fredrickson, NP  Nada Boozer, NP  Georgie Chard, NP    Other Instructions      Signed, Lesleigh Noe, MD  06/26/2020 1:05 PM    [facility]

## 2020-06-26 ENCOUNTER — Other Ambulatory Visit: Payer: Self-pay

## 2020-06-26 ENCOUNTER — Encounter: Payer: Self-pay | Admitting: Interventional Cardiology

## 2020-06-26 ENCOUNTER — Ambulatory Visit: Payer: PPO | Admitting: Interventional Cardiology

## 2020-06-26 VITALS — BP 112/88 | HR 46 | Ht 66.0 in | Wt 189.8 lb

## 2020-06-26 DIAGNOSIS — Z7189 Other specified counseling: Secondary | ICD-10-CM

## 2020-06-26 DIAGNOSIS — E7800 Pure hypercholesterolemia, unspecified: Secondary | ICD-10-CM

## 2020-06-26 DIAGNOSIS — I10 Essential (primary) hypertension: Secondary | ICD-10-CM

## 2020-06-26 DIAGNOSIS — I739 Peripheral vascular disease, unspecified: Secondary | ICD-10-CM | POA: Diagnosis not present

## 2020-06-26 DIAGNOSIS — I25812 Atherosclerosis of bypass graft of coronary artery of transplanted heart without angina pectoris: Secondary | ICD-10-CM | POA: Diagnosis not present

## 2020-06-26 DIAGNOSIS — E1151 Type 2 diabetes mellitus with diabetic peripheral angiopathy without gangrene: Secondary | ICD-10-CM | POA: Diagnosis not present

## 2020-06-26 DIAGNOSIS — R001 Bradycardia, unspecified: Secondary | ICD-10-CM

## 2020-06-26 MED ORDER — METOPROLOL SUCCINATE ER 25 MG PO TB24: 25.0000 mg | ORAL_TABLET | Freq: Every day | ORAL | 3 refills | Status: DC

## 2020-06-26 NOTE — Patient Instructions (Signed)
Medication Instructions:  1) DECREASE Metoprolol Succinate to 25mg  once daily.  Contact the office if chest pain or discomfort worsens with decreased dose.   *If you need a refill on your cardiac medications before your next appointment, please call your pharmacy*   Lab Work: None If you have labs (blood work) drawn today and your tests are completely normal, you will receive your results only by: MyChart Message (if you have MyChart) OR . A paper copy in the mail If you have any lab test that is abnormal or we need to change your treatment, we will call you to review the results.   Testing/Procedures: None   Follow-Up: At [facility], you and your health needs are our priority.  As part of our continuing mission to provide you with exceptional heart care, we have created designated Provider Care Teams.  These Care Teams include your primary Cardiologist (physician) and Advanced Practice Providers (APPs -  Physician Assistants and Nurse Practitioners) who all work together to provide you with the care you need, when you need it.  We recommend signing up for the patient portal called "MyChart".  Sign up information is provided on this After Visit Summary.  MyChart is used to connect with patients for Virtual Visits (Telemedicine).  Patients are able to view lab/test results, encounter notes, upcoming appointments, etc.  Non-urgent messages can be sent to your provider as well.   To learn more about what you can do with MyChart, go to [facility].    Your next appointment:   12 month(s)  The format for your next appointment:   In Person  Provider:   You may see [email or web address], MD or one of the following Advanced Practice Providers on your designated Care Team:    Lesleigh Noe, NP  Norma Fredrickson, NP  Nada Boozer, NP    Other Instructions

## 2020-06-27 DIAGNOSIS — E039 Hypothyroidism, unspecified: Secondary | ICD-10-CM | POA: Diagnosis not present

## 2020-06-27 DIAGNOSIS — I2510 Atherosclerotic heart disease of native coronary artery without angina pectoris: Secondary | ICD-10-CM | POA: Diagnosis not present

## 2020-06-27 DIAGNOSIS — I10 Essential (primary) hypertension: Secondary | ICD-10-CM | POA: Diagnosis not present

## 2020-06-27 DIAGNOSIS — E1151 Type 2 diabetes mellitus with diabetic peripheral angiopathy without gangrene: Secondary | ICD-10-CM | POA: Diagnosis not present

## 2020-06-27 DIAGNOSIS — N400 Enlarged prostate without lower urinary tract symptoms: Secondary | ICD-10-CM | POA: Diagnosis not present

## 2020-06-27 DIAGNOSIS — R413 Other amnesia: Secondary | ICD-10-CM | POA: Diagnosis not present

## 2020-06-27 DIAGNOSIS — I739 Peripheral vascular disease, unspecified: Secondary | ICD-10-CM | POA: Diagnosis not present

## 2020-06-27 DIAGNOSIS — E7800 Pure hypercholesterolemia, unspecified: Secondary | ICD-10-CM | POA: Diagnosis not present

## 2020-06-27 DIAGNOSIS — Z0000 Encounter for general adult medical examination without abnormal findings: Secondary | ICD-10-CM | POA: Diagnosis not present

## 2020-06-27 DIAGNOSIS — E538 Deficiency of other specified B group vitamins: Secondary | ICD-10-CM | POA: Diagnosis not present

## 2020-06-27 DIAGNOSIS — I359 Nonrheumatic aortic valve disorder, unspecified: Secondary | ICD-10-CM | POA: Diagnosis not present

## 2020-07-11 DIAGNOSIS — E039 Hypothyroidism, unspecified: Secondary | ICD-10-CM | POA: Diagnosis not present

## 2020-07-11 DIAGNOSIS — E7800 Pure hypercholesterolemia, unspecified: Secondary | ICD-10-CM | POA: Diagnosis not present

## 2020-07-11 DIAGNOSIS — N400 Enlarged prostate without lower urinary tract symptoms: Secondary | ICD-10-CM | POA: Diagnosis not present

## 2020-07-11 DIAGNOSIS — E782 Mixed hyperlipidemia: Secondary | ICD-10-CM | POA: Diagnosis not present

## 2020-07-11 DIAGNOSIS — E1151 Type 2 diabetes mellitus with diabetic peripheral angiopathy without gangrene: Secondary | ICD-10-CM | POA: Diagnosis not present

## 2020-07-11 DIAGNOSIS — E119 Type 2 diabetes mellitus without complications: Secondary | ICD-10-CM | POA: Diagnosis not present

## 2020-07-11 DIAGNOSIS — I10 Essential (primary) hypertension: Secondary | ICD-10-CM | POA: Diagnosis not present

## 2020-07-11 DIAGNOSIS — I2510 Atherosclerotic heart disease of native coronary artery without angina pectoris: Secondary | ICD-10-CM | POA: Diagnosis not present

## 2020-07-16 DIAGNOSIS — E538 Deficiency of other specified B group vitamins: Secondary | ICD-10-CM | POA: Diagnosis not present

## 2020-07-31 DIAGNOSIS — Z23 Encounter for immunization: Secondary | ICD-10-CM | POA: Diagnosis not present

## 2020-08-05 DIAGNOSIS — Z03818 Encounter for observation for suspected exposure to other biological agents ruled out: Secondary | ICD-10-CM | POA: Diagnosis not present

## 2020-08-05 DIAGNOSIS — Z20822 Contact with and (suspected) exposure to covid-19: Secondary | ICD-10-CM | POA: Diagnosis not present

## 2020-08-05 DIAGNOSIS — R001 Bradycardia, unspecified: Secondary | ICD-10-CM | POA: Diagnosis not present

## 2020-08-06 ENCOUNTER — Telehealth: Payer: Self-pay | Admitting: Interventional Cardiology

## 2020-08-06 NOTE — Telephone Encounter (Signed)
Pt wife called and said they went to [facility] and they mentioned that the medication lisinopril (PRINIVIL,ZESTRIL) 40 MG tablet was making his pulse really low. They checked it and it was a 50. Wants to talk with Dr. Katrinka Blazing or nurse about the medication.

## 2020-08-06 NOTE — Telephone Encounter (Signed)
As long as he feels okay, there is no action required at this time.  If lightheadedness, dizziness, exertional fatigue suggesting that moderate bradycardia is causing a problem, we may end up discontinuing the medication.  I do not think that is indicated right now.

## 2020-08-06 NOTE — Telephone Encounter (Signed)
Pt not having symptoms.  Wife states he feels great.  Advised her of information from Dr. Katrinka Blazing.  Advised to call if he develops symptoms.  Wife appreciative for call.

## 2020-08-06 NOTE — Telephone Encounter (Signed)
Spoke with wife and she states she and pt were at [facility] yesterday to be covid tested due to exposure from a neighbor.  States pt's HR was 48 and at recheck it was 50.  MD there told him likely coming from his Metoprolol and to contact us.  Pt's Metoprolol was decreased to 25mg  QD on 06/26/2020.  Wife states HR has remained in the upper 40s/lower 50s since dose change.  BP usually 120-130/70-80.  Will route to Dr. 08/26/2020 for review.

## 2020-08-16 DIAGNOSIS — E538 Deficiency of other specified B group vitamins: Secondary | ICD-10-CM | POA: Diagnosis not present

## 2020-08-24 ENCOUNTER — Other Ambulatory Visit: Payer: Self-pay

## 2020-08-24 ENCOUNTER — Ambulatory Visit: Payer: PPO | Attending: Internal Medicine

## 2020-08-24 DIAGNOSIS — Z23 Encounter for immunization: Secondary | ICD-10-CM

## 2020-08-24 NOTE — Progress Notes (Signed)
   Covid-19 Vaccination Clinic  Name:  [name] [name]    MRN: [id number] DOB: 25-Apr-1941  08/24/2020  [name] was observed post Covid-19 immunization for 15 minutes without incident. He was provided with Vaccine Information Sheet and instruction to access the V-Safe system.   [name] was instructed to call 911 with any severe reactions post vaccine: Marland Kitchen Difficulty breathing  . Swelling of face and throat  . A fast heartbeat  . A bad rash all over body  . Dizziness and weakness

## 2020-09-16 DIAGNOSIS — E538 Deficiency of other specified B group vitamins: Secondary | ICD-10-CM | POA: Diagnosis not present

## 2020-10-17 DIAGNOSIS — E538 Deficiency of other specified B group vitamins: Secondary | ICD-10-CM | POA: Diagnosis not present

## 2020-11-18 DIAGNOSIS — E538 Deficiency of other specified B group vitamins: Secondary | ICD-10-CM | POA: Diagnosis not present

## 2020-11-25 DIAGNOSIS — E782 Mixed hyperlipidemia: Secondary | ICD-10-CM | POA: Diagnosis not present

## 2020-11-25 DIAGNOSIS — N400 Enlarged prostate without lower urinary tract symptoms: Secondary | ICD-10-CM | POA: Diagnosis not present

## 2020-11-25 DIAGNOSIS — E7800 Pure hypercholesterolemia, unspecified: Secondary | ICD-10-CM | POA: Diagnosis not present

## 2020-11-25 DIAGNOSIS — E039 Hypothyroidism, unspecified: Secondary | ICD-10-CM | POA: Diagnosis not present

## 2020-11-25 DIAGNOSIS — I10 Essential (primary) hypertension: Secondary | ICD-10-CM | POA: Diagnosis not present

## 2020-11-25 DIAGNOSIS — E119 Type 2 diabetes mellitus without complications: Secondary | ICD-10-CM | POA: Diagnosis not present

## 2020-11-25 DIAGNOSIS — E1151 Type 2 diabetes mellitus with diabetic peripheral angiopathy without gangrene: Secondary | ICD-10-CM | POA: Diagnosis not present

## 2020-11-25 DIAGNOSIS — I2510 Atherosclerotic heart disease of native coronary artery without angina pectoris: Secondary | ICD-10-CM | POA: Diagnosis not present

## 2020-12-19 DIAGNOSIS — Z7189 Other specified counseling: Secondary | ICD-10-CM | POA: Diagnosis not present

## 2020-12-25 DIAGNOSIS — I10 Essential (primary) hypertension: Secondary | ICD-10-CM | POA: Diagnosis not present

## 2020-12-25 DIAGNOSIS — I2510 Atherosclerotic heart disease of native coronary artery without angina pectoris: Secondary | ICD-10-CM | POA: Diagnosis not present

## 2020-12-25 DIAGNOSIS — I359 Nonrheumatic aortic valve disorder, unspecified: Secondary | ICD-10-CM | POA: Diagnosis not present

## 2020-12-25 DIAGNOSIS — Z7984 Long term (current) use of oral hypoglycemic drugs: Secondary | ICD-10-CM | POA: Diagnosis not present

## 2020-12-25 DIAGNOSIS — E1151 Type 2 diabetes mellitus with diabetic peripheral angiopathy without gangrene: Secondary | ICD-10-CM | POA: Diagnosis not present

## 2020-12-25 DIAGNOSIS — E039 Hypothyroidism, unspecified: Secondary | ICD-10-CM | POA: Diagnosis not present

## 2020-12-25 DIAGNOSIS — E7800 Pure hypercholesterolemia, unspecified: Secondary | ICD-10-CM | POA: Diagnosis not present

## 2020-12-25 DIAGNOSIS — R413 Other amnesia: Secondary | ICD-10-CM | POA: Diagnosis not present

## 2020-12-25 DIAGNOSIS — I739 Peripheral vascular disease, unspecified: Secondary | ICD-10-CM | POA: Diagnosis not present

## 2021-01-17 DIAGNOSIS — E538 Deficiency of other specified B group vitamins: Secondary | ICD-10-CM | POA: Diagnosis not present

## 2021-02-03 DIAGNOSIS — E039 Hypothyroidism, unspecified: Secondary | ICD-10-CM | POA: Diagnosis not present

## 2021-02-03 DIAGNOSIS — E782 Mixed hyperlipidemia: Secondary | ICD-10-CM | POA: Diagnosis not present

## 2021-02-03 DIAGNOSIS — E7800 Pure hypercholesterolemia, unspecified: Secondary | ICD-10-CM | POA: Diagnosis not present

## 2021-02-03 DIAGNOSIS — E1151 Type 2 diabetes mellitus with diabetic peripheral angiopathy without gangrene: Secondary | ICD-10-CM | POA: Diagnosis not present

## 2021-02-03 DIAGNOSIS — I10 Essential (primary) hypertension: Secondary | ICD-10-CM | POA: Diagnosis not present

## 2021-02-03 DIAGNOSIS — N400 Enlarged prostate without lower urinary tract symptoms: Secondary | ICD-10-CM | POA: Diagnosis not present

## 2021-02-03 DIAGNOSIS — I2510 Atherosclerotic heart disease of native coronary artery without angina pectoris: Secondary | ICD-10-CM | POA: Diagnosis not present

## 2021-02-03 DIAGNOSIS — E119 Type 2 diabetes mellitus without complications: Secondary | ICD-10-CM | POA: Diagnosis not present

## 2021-02-10 DIAGNOSIS — R3912 Poor urinary stream: Secondary | ICD-10-CM | POA: Diagnosis not present

## 2021-02-10 DIAGNOSIS — N200 Calculus of kidney: Secondary | ICD-10-CM | POA: Diagnosis not present

## 2021-02-10 DIAGNOSIS — N476 Balanoposthitis: Secondary | ICD-10-CM | POA: Diagnosis not present

## 2021-02-17 DIAGNOSIS — E538 Deficiency of other specified B group vitamins: Secondary | ICD-10-CM | POA: Diagnosis not present

## 2021-02-27 ENCOUNTER — Other Ambulatory Visit: Payer: Self-pay | Admitting: Diagnostic Neuroimaging

## 2021-03-12 ENCOUNTER — Other Ambulatory Visit: Payer: Self-pay

## 2021-03-12 DIAGNOSIS — I739 Peripheral vascular disease, unspecified: Secondary | ICD-10-CM

## 2021-03-12 DIAGNOSIS — I6523 Occlusion and stenosis of bilateral carotid arteries: Secondary | ICD-10-CM

## 2021-03-25 DIAGNOSIS — R413 Other amnesia: Secondary | ICD-10-CM | POA: Diagnosis not present

## 2021-03-26 ENCOUNTER — Other Ambulatory Visit: Payer: Self-pay | Admitting: Diagnostic Neuroimaging

## 2021-03-28 ENCOUNTER — Telehealth: Payer: Self-pay | Admitting: Diagnostic Neuroimaging

## 2021-03-28 NOTE — Telephone Encounter (Signed)
Pt is needing a refill on his memantine (NAMENDA) 10 MG tablet sent in to the [facility] on [facility].

## 2021-03-31 MED ORDER — MEMANTINE HCL 10 MG PO TABS: 10.0000 mg | ORAL_TABLET | Freq: Two times a day (BID) | ORAL | 1 refills | Status: DC

## 2021-04-07 DIAGNOSIS — E119 Type 2 diabetes mellitus without complications: Secondary | ICD-10-CM | POA: Diagnosis not present

## 2021-04-07 DIAGNOSIS — E782 Mixed hyperlipidemia: Secondary | ICD-10-CM | POA: Diagnosis not present

## 2021-04-07 DIAGNOSIS — E039 Hypothyroidism, unspecified: Secondary | ICD-10-CM | POA: Diagnosis not present

## 2021-04-07 DIAGNOSIS — E7800 Pure hypercholesterolemia, unspecified: Secondary | ICD-10-CM | POA: Diagnosis not present

## 2021-04-07 DIAGNOSIS — I2510 Atherosclerotic heart disease of native coronary artery without angina pectoris: Secondary | ICD-10-CM | POA: Diagnosis not present

## 2021-04-07 DIAGNOSIS — N400 Enlarged prostate without lower urinary tract symptoms: Secondary | ICD-10-CM | POA: Diagnosis not present

## 2021-04-07 DIAGNOSIS — I10 Essential (primary) hypertension: Secondary | ICD-10-CM | POA: Diagnosis not present

## 2021-04-07 DIAGNOSIS — E1151 Type 2 diabetes mellitus with diabetic peripheral angiopathy without gangrene: Secondary | ICD-10-CM | POA: Diagnosis not present

## 2021-04-23 ENCOUNTER — Other Ambulatory Visit: Payer: Self-pay

## 2021-04-23 ENCOUNTER — Ambulatory Visit: Payer: PPO | Admitting: Physician Assistant

## 2021-04-23 ENCOUNTER — Ambulatory Visit (INDEPENDENT_AMBULATORY_CARE_PROVIDER_SITE_OTHER)
Admission: RE | Admit: 2021-04-23 | Discharge: 2021-04-23 | Disposition: A | Discharge status: Home / Self Care | Payer: PPO | Source: Ambulatory Visit | Attending: Vascular Surgery | Admitting: Vascular Surgery

## 2021-04-23 ENCOUNTER — Ambulatory Visit (HOSPITAL_COMMUNITY)
Admission: RE | Admit: 2021-04-23 | Discharge: 2021-04-23 | Disposition: A | Discharge status: Home / Self Care | Payer: PPO | Source: Ambulatory Visit | Attending: Vascular Surgery | Admitting: Vascular Surgery

## 2021-04-23 VITALS — BP 123/63 | HR 49 | Temp 97.9°F | Resp 20 | Ht 66.0 in | Wt 189.9 lb

## 2021-04-23 DIAGNOSIS — R413 Other amnesia: Secondary | ICD-10-CM | POA: Insufficient documentation

## 2021-04-23 DIAGNOSIS — N400 Enlarged prostate without lower urinary tract symptoms: Secondary | ICD-10-CM | POA: Insufficient documentation

## 2021-04-23 DIAGNOSIS — I6523 Occlusion and stenosis of bilateral carotid arteries: Secondary | ICD-10-CM | POA: Diagnosis not present

## 2021-04-23 DIAGNOSIS — I739 Peripheral vascular disease, unspecified: Secondary | ICD-10-CM | POA: Diagnosis not present

## 2021-04-23 DIAGNOSIS — I2510 Atherosclerotic heart disease of native coronary artery without angina pectoris: Secondary | ICD-10-CM | POA: Insufficient documentation

## 2021-04-23 DIAGNOSIS — R0989 Other specified symptoms and signs involving the circulatory and respiratory systems: Secondary | ICD-10-CM | POA: Insufficient documentation

## 2021-04-23 DIAGNOSIS — Z7189 Other specified counseling: Secondary | ICD-10-CM | POA: Insufficient documentation

## 2021-04-23 DIAGNOSIS — I358 Other nonrheumatic aortic valve disorders: Secondary | ICD-10-CM | POA: Insufficient documentation

## 2021-04-23 NOTE — Progress Notes (Signed)
HISTORY AND PHYSICAL     CC:  follow up. Requesting Provider:  Renford DillsPolite, Ronald, MD  HPI: This is a 80 y.o. male who is here today for follow up and was originally seen by Dr. Imogene Burnhen in 2016 and has hx of BLE chronic venous insufficiency, PAD c/w diabetes.   Pt was last seen on 04/22/2020.  At that time, he was doing well overall.  He was asymptomatic from his carotid stenosis and was not having any claudication, rest pain or non healing wounds.  He had not been as active as the gym had been closed due to covid but was staying active around the house and still push mowing the lawn.  He intermittent leg swelling was controlled with knee high compression socks.  He had recently been diagnosed with dementia.    Pt has hx of CABG x 6 in 1998 with bilateral saphenous vein harvest.  He states he retired and had his open heart surgery shortly after that.  He also has hx ofDM, HTN.  The pt returns today for follow up and his here with his wife.  He states he is doing pretty good.  He continues to be quite active and still push mows his yard.  He continues to wear his compression socks, which helps with the swelling.  He states that the right leg swelling is worse than the left.  He denies having any chest pain.  He has never had a stroke.    Pt denies any amaurosis fugax, speech difficulties, weakness, numbness, paralysis or clumsiness or facial droop.    Pt denies claudication, rest pain, or non healing wounds.    The pt is on a statin for cholesterol management.    The pt is on an aspirin.    Other AC:  none The pt is on CCB, BB, ACEI for hypertension.  The pt does have diabetes. Tobacco hx:  former-quit 1968  Pt does not have family hx of AAA.    Past Medical History:  Diagnosis Date   Benign prostatic hypertrophy with lower urinary tract symptoms (LUTS)    Bilateral edema of lower extremity    IMPROVED W/ TED HOSE   CAD (coronary artery disease)    cardiologist-  dr Verdis Primehenry smith--  s/p  cabg  x6 in 1998   Heart murmur    Hiatal hernia    History of kidney stones    Hydronephrosis, left    Hyperlipidemia    Hypertension    Hypothyroidism    Left ureteral stone    Memory difficulties    Nephrolithiasis    right side per ct  non-obstructive   PVD (peripheral vascular disease) (HCC)    RBBB (right bundle branch block)    S/P CABG x 6    1998   Sigmoid diverticulosis    Sinus bradycardia    Type 2 diabetes mellitus (HCC)     Past Surgical History:  Procedure Laterality Date   CATARACT EXTRACTION W/ INTRAOCULAR LENS  IMPLANT, BILATERAL  2011   CORONARY ARTERY BYPASS GRAFT  1998   CABG X 6 --   LIMA  to the LAD and  other undefined SVG's   CYSTOSCOPY WITH BIOPSY N/A 11/29/2015   Procedure: CYSTOSCOPY WITH BIOPSY AND FULGERATION;  Surgeon: Sebastian Acheheodore Manny, MD;  [facility];  Service: Urology;  Laterality: N/A;   CYSTOSCOPY/RETROGRADE/URETEROSCOPY/STONE EXTRACTION WITH BASKET Left 11/29/2015   Procedure: CYSTOSCOPY/RETROGRADE/DIAGNOSTIC URETEROSCOPY/STONE EXTRACTION;  Surgeon: Sebastian Acheheodore Manny, MD;  [facility];  Service: Urology;  Laterality: Left;   EXTRACORPOREAL SHOCK WAVE LITHOTRIPSY Left 08-12-2015   LEFT HEART CATHETERIZATION WITH CORONARY/GRAFT ANGIOGRAM  06/16/2013   Procedure: LEFT HEART CATHETERIZATION WITH Isabel Caprice;  Surgeon: Lesleigh Noe, MD;  Location: [facility] CATH LAB;  Service: Cardiovascular; Native vessel occlusive disease w/ ostial total occlusion RCA, dLM, & heavy calicification LM LAD CX RCA/  SVG to diagonal patent, SVG to marginal 1 patent & 2 occluded, LIMA to dLAD patent/ dCFX collateral from obtuse marginal graft/  normal LVF   TRANSTHORACIC ECHOCARDIOGRAM  05-23-2015  dr Verdis Prime   mild LVH,  grade 1 diastolic dysfunction,  ef 55-060%,  mild AV calcifacation without stenosis,  mild AR and MR,  moderate LAE,  trivial PR and TR,  mild increase pulmonary pressure    Allergies  Allergen  Reactions   Spironolactone Rash   Penicillins Rash    Has patient had a PCN reaction causing immediate rash, facial/tongue/throat swelling, SOB or lightheadedness with hypotension:no Has patient had a PCN reaction causing severe rash involving mucus membranes or skin necrosis: no Has patient had a PCN reaction that required hospitalization no Has patient had a PCN reaction occurring within the last 10 years: no If all of the above answers are "NO", then may proceed with Cephalosporin use.     Current Outpatient Medications  Medication Sig Dispense Refill   amLODipine (NORVASC) 5 MG tablet Take 5 mg by mouth daily.     aspirin EC 81 MG tablet Take 1 tablet (81 mg total) by mouth daily. 90 tablet 3   atorvastatin (LIPITOR) 80 MG tablet Take 40 mg by mouth every morning.      Cyanocobalamin (B-12 IJ) Use as directed monthly.     finasteride (PROSCAR) 5 MG tablet Take 5 mg by mouth every morning.      Flaxseed, Linseed, (FLAX SEEDS PO) Take 1 capsule by mouth daily.     hydrochlorothiazide (HYDRODIURIL) 25 MG tablet Take 25 mg by mouth every morning.      levothyroxine (SYNTHROID, LEVOTHROID) 100 MCG tablet Take 100 mcg by mouth daily before breakfast.     lisinopril (PRINIVIL,ZESTRIL) 40 MG tablet Take 40 mg by mouth every morning.      memantine (NAMENDA) 10 MG tablet Take 1 tablet (10 mg total) by mouth 2 (two) times daily. 60 tablet 1   metFORMIN (GLUCOPHAGE) 500 MG tablet Take 500 mg by mouth daily.      metoprolol succinate (TOPROL-XL) 25 MG 24 hr tablet Take 1 tablet (25 mg total) by mouth daily. 90 tablet 3   nitroGLYCERIN (NITROSTAT) 0.4 MG SL tablet DISSOLVE ONE TABLET UNDER THE TONGUE EVERY 5 MINUTES AS NEEDED FOR CHEST PAIN 25 tablet 5   potassium chloride SA (K-DUR,KLOR-CON) 20 MEQ tablet Take 10 mEq by mouth every morning.      tamsulosin (FLOMAX) 0.4 MG CAPS capsule Take 0.4 mg by mouth daily.     No current facility-administered medications for this visit.    Family  History  Problem Relation Age of Onset   Tuberculosis Mother    CAD Father    Heart disease Sister     Social History   Socioeconomic History   Marital status: Married    Spouse name: [name]   Number of children: 2   Years of education: 12   Highest education level: Not on file  Occupational History    Comment: retired  Tobacco Use   Smoking status: Former  Years: 10.00    Pack years: 0.00    Types: Cigarettes    Quit date: 10/26/1966    Years since quitting: 54.5   Smokeless tobacco: Never  Vaping Use   Vaping Use: Never used  Substance and Sexual Activity   Alcohol use: No    Alcohol/week: 0.0 standard drinks   Drug use: No   Sexual activity: Not on file  Other Topics Concern   Not on file  Social History Narrative   Not on file   Social Determinants of Health   Financial Resource Strain: Not on file  Food Insecurity: Not on file  Transportation Needs: Not on file  Physical Activity: Not on file  Stress: Not on file  Social Connections: Not on file  Intimate Partner Violence: Not on file     REVIEW OF SYSTEMS:   [X]  denotes positive finding, [ ]  denotes negative finding Cardiac  Comments:  Chest pain or chest pressure:    Shortness of breath upon exertion:    Short of breath when lying flat:    Irregular heart rhythm:        Vascular    Pain in calf, thigh, or hip brought on by ambulation:    Pain in feet at night that wakes you up from your sleep:     Blood clot in your veins:    Leg swelling:         Pulmonary    Oxygen at home:    Wheezing:         Neurologic    Sudden weakness in arms or legs:     Sudden numbness in arms or legs:     Sudden onset of difficulty speaking or understanding others    Temporary loss of vision in one eye:     Problems with dizziness:         Gastrointestinal    Blood in stool:     Vomited blood:         Genitourinary    Burning when urinating:     Blood in urine:        Psychiatric    Major  depression:         Hematologic    Bleeding problems:    Problems with blood clotting too easily:        Skin    Rashes or ulcers:        Constitutional    Fever or chills:      PHYSICAL EXAMINATION:  Today's Vitals   04/23/21 1052  BP: 123/63  Pulse: (!) 49  Resp: 20  Temp: 97.9 F (36.6 C)  TempSrc: Temporal  SpO2: 95%  Weight: 189 lb 14.4 oz (86.1 kg)  Height: 5\' 6"  (1.676 m)   Body mass index is 30.65 kg/m.   General:  WDWN in NAD; vital signs documented above Gait: Not observed HENT: WNL, normocephalic Pulmonary: normal non-labored breathing  Cardiac: regular HR;  without carotid bruits Abdomen: soft, NT, aortic pulse is not palpable Skin: without rashes Vascular Exam/Pulses:  Right Left  Radial 2+ (normal) 2+ (normal)  Popliteal Unable to palpate Unable to palpate  DP 1+ (weak) absent  PT Brisk doppler Brisk doppler  Peroneal Not evaluated Brisk doppler   Extremities: without ischemic changes, without Gangrene , without cellulitis; without open wounds; well healed saphenectomy scars bilaterally Musculoskeletal: no muscle wasting or atrophy  Neurologic: A&O X 3;  speech is fluent/normal; moving all extremities equally  Psychiatric:  The pt has Normal  affect.   Non-Invasive Vascular Imaging:   ABI's/TBI's on 04/23/2021: Right:  1.07/0.94 - great toe pressure:  115 Left:  2.09/0.70 - great toe pressure:  86  Non-Invasive Vascular Imaging:   Carotid Duplex on 04/23/2021: Right:  1-39% ICA stenosis Left:  40-59% ICA stenosis  Previous ABI's/TBI's on 04/22/2020: Right:  1.4/0.82 - great toe pressure:  150 Left:  1.4/0.94 - great toe pressure:  171 Summary:  Right: Resting right ankle-brachial index indicates noncompressible right  lower extremity arteries. The right toe-brachial index is normal. RT great  toe pressure = 150 mmHg.   Left: Resting left ankle-brachial index indicates noncompressible left  lower extremity arteries. The left toe-brachial  index is abnormal. LT  Great toe pressure = 171 mmHg.   Previous Carotid duplex on 04/22/2020: Right: 1-39% ICA stenosis Left:   40-59% ICA stenosis    ASSESSMENT/PLAN:: 80 y.o. male here for follow up for PAD and bilateral carotid artery stenosis   PAD -pt doing well with normal TBI and toe pressures; he has good doppler signals bilateral PT and palpable right DP.  -pt does not have rest pain, claudication, non healing wounds. -continue graduated walking program -pt will f/u in one year with ABI  Carotid stenosis -duplex today reveals his study is essentially unchanged from previous exam with 1-39% right ICA stenosis and 40-59% left ICA stenosis. He is asymptomatic -discussed s/s of stroke/TIA and if pt develops any sx, they know to call 911 or go to the emergency room -pt will f/u in one year with carotid duplex  Venous insufficiency Continue wearing compression socks.   -continue statin/asa    Doreatha Massed, [facility] [phone]  Clinic MD:   Edilia Bo

## 2021-04-25 DIAGNOSIS — E538 Deficiency of other specified B group vitamins: Secondary | ICD-10-CM | POA: Diagnosis not present

## 2021-05-08 DIAGNOSIS — R634 Abnormal weight loss: Secondary | ICD-10-CM | POA: Diagnosis not present

## 2021-05-08 DIAGNOSIS — R413 Other amnesia: Secondary | ICD-10-CM | POA: Diagnosis not present

## 2021-05-19 ENCOUNTER — Ambulatory Visit: Payer: PPO | Admitting: Diagnostic Neuroimaging

## 2021-05-27 DIAGNOSIS — H18413 Arcus senilis, bilateral: Secondary | ICD-10-CM | POA: Diagnosis not present

## 2021-05-27 DIAGNOSIS — E538 Deficiency of other specified B group vitamins: Secondary | ICD-10-CM | POA: Diagnosis not present

## 2021-05-27 DIAGNOSIS — H02831 Dermatochalasis of right upper eyelid: Secondary | ICD-10-CM | POA: Diagnosis not present

## 2021-05-27 DIAGNOSIS — Z961 Presence of intraocular lens: Secondary | ICD-10-CM | POA: Diagnosis not present

## 2021-05-27 DIAGNOSIS — H04123 Dry eye syndrome of bilateral lacrimal glands: Secondary | ICD-10-CM | POA: Diagnosis not present

## 2021-06-09 ENCOUNTER — Ambulatory Visit: Payer: PPO | Admitting: Diagnostic Neuroimaging

## 2021-06-09 ENCOUNTER — Encounter: Payer: Self-pay | Admitting: Diagnostic Neuroimaging

## 2021-06-09 VITALS — BP 104/63 | HR 46 | Ht 66.0 in | Wt 167.2 lb

## 2021-06-09 DIAGNOSIS — R413 Other amnesia: Secondary | ICD-10-CM | POA: Diagnosis not present

## 2021-06-09 MED ORDER — MEMANTINE HCL 10 MG PO TABS: 10.0000 mg | ORAL_TABLET | Freq: Two times a day (BID) | ORAL | 4 refills | Status: DC

## 2021-06-09 NOTE — Progress Notes (Signed)
[facility]  PATIENT: [name] [name] DOB: 04/12/1941  REFERRING CLINICIAN: Renford Dills, MD HISTORY FROM: patient and wife  REASON FOR VISIT: follow up   HISTORICAL  CHIEF COMPLAINT:  Chief Complaint  Patient presents with   Memory Loss    Rm 7 FU for medication management, wifeRiley [name]  MMSE 23    HISTORY OF PRESENT ILLNESS:   UPDATE (06/09/21, VRP): Since last visit, doing well. Symptoms are stable. No alleviating or aggravating factors. Tolerating meds.    PRIOR HPI (March 5434): 80 year old male here for evaluation of memory loss.  Patient reports some mild memory difficulty but he does not think these are that significant.  Wife is noted at least 2 years of gradual onset progressive short-term memory loss, repeating self, difficulty paying bills, difficulty driving and shopping alone.  Patient's wife has to supervise him for these issues.  She helps him with medication.  Symptoms are gradual progressive over time.  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  He graduated from high school.  He has had loss of sense of smell for many years.  Before the pandemic he and his wife were exercising 5 days a week.  They both received Covid vaccines, and are looking forward to rejoining [employer] soon.    REVIEW OF SYSTEMS: Full 14 system review of systems performed and negative with exception of: As per HPI.  ALLERGIES: Allergies  Allergen Reactions   Spironolactone Rash   Penicillins Rash    Has patient had a PCN reaction causing immediate rash, facial/tongue/throat swelling, SOB or lightheadedness with hypotension:no Has patient had a PCN reaction causing severe rash involving mucus membranes or skin necrosis: no Has patient had a PCN reaction that required hospitalization no Has patient had a PCN reaction occurring within the last 10 years: no If all of the above answers are "NO", then may proceed with Cephalosporin use.      HOME MEDICATIONS: Outpatient Medications Prior to Visit  Medication Sig Dispense Refill   amLODipine (NORVASC) 5 MG tablet Take 5 mg by mouth daily.     aspirin EC 81 MG tablet Take 1 tablet (81 mg total) by mouth daily. 90 tablet 3   atorvastatin (LIPITOR) 80 MG tablet Take 40 mg by mouth every morning.      Cyanocobalamin (B-12 IJ) Use as directed monthly.     finasteride (PROSCAR) 5 MG tablet Take 5 mg by mouth every morning.      Flaxseed, Linseed, (FLAX SEEDS PO) Take 1 capsule by mouth daily.     hydrochlorothiazide (HYDRODIURIL) 25 MG tablet Take 25 mg by mouth every morning.      levothyroxine (SYNTHROID, LEVOTHROID) 100 MCG tablet Take 100 mcg by mouth daily before breakfast.     lisinopril (PRINIVIL,ZESTRIL) 40 MG tablet Take 40 mg by mouth every morning.      metFORMIN (GLUCOPHAGE) 500 MG tablet Take 500 mg by mouth daily.      metoprolol succinate (TOPROL-XL) 25 MG 24 hr tablet Take 1 tablet (25 mg total) by mouth daily. 90 tablet 3   nitroGLYCERIN (NITROSTAT) 0.4 MG SL tablet DISSOLVE ONE TABLET UNDER THE TONGUE EVERY 5 MINUTES AS NEEDED FOR CHEST PAIN 25 tablet 5   potassium chloride SA (K-DUR,KLOR-CON) 20 MEQ tablet Take 10 mEq by mouth every morning.      tamsulosin (FLOMAX) 0.4 MG CAPS capsule Take 0.4 mg by mouth daily.     memantine (NAMENDA) 10 MG  tablet Take 1 tablet (10 mg total) by mouth 2 (two) times daily. 60 tablet 1   No facility-administered medications prior to visit.    PAST MEDICAL HISTORY: Past Medical History:  Diagnosis Date   Benign prostatic hypertrophy with lower urinary tract symptoms (LUTS)    Bilateral edema of lower extremity    IMPROVED W/ TED HOSE   CAD (coronary artery disease)    cardiologist-  dr Verdis Prime--  s/p  cabg x6 in 1998   Heart murmur    Hiatal hernia    History of kidney stones    Hydronephrosis, left    Hyperlipidemia    Hypertension    Hypothyroidism    Left ureteral stone    Memory difficulties     Nephrolithiasis    right side per ct  non-obstructive   PVD (peripheral vascular disease) (HCC)    RBBB (right bundle branch block)    S/P CABG x 6    1998   Sigmoid diverticulosis    Sinus bradycardia    Type 2 diabetes mellitus (HCC)     PAST SURGICAL HISTORY: Past Surgical History:  Procedure Laterality Date   CATARACT EXTRACTION W/ INTRAOCULAR LENS  IMPLANT, BILATERAL  2011   CORONARY ARTERY BYPASS GRAFT  1998   CABG X 6 --   LIMA  to the LAD and  other undefined SVG's   CYSTOSCOPY WITH BIOPSY N/A 11/29/2015   Procedure: CYSTOSCOPY WITH BIOPSY AND FULGERATION;  Surgeon: Sebastian Ache, MD;  Location: [facility];  Service: Urology;  Laterality: N/A;   CYSTOSCOPY/RETROGRADE/URETEROSCOPY/STONE EXTRACTION WITH BASKET Left 11/29/2015   Procedure: CYSTOSCOPY/RETROGRADE/DIAGNOSTIC URETEROSCOPY/STONE EXTRACTION;  Surgeon: Sebastian Ache, MD;  [facility];  Service: Urology;  Laterality: Left;   EXTRACORPOREAL SHOCK WAVE LITHOTRIPSY Left 08-12-2015   LEFT HEART CATHETERIZATION WITH CORONARY/GRAFT ANGIOGRAM  06/16/2013   Procedure: LEFT HEART CATHETERIZATION WITH Isabel Caprice;  Surgeon: Lesleigh Noe, MD;  Location: [facility] CATH LAB;  Service: Cardiovascular; Native vessel occlusive disease w/ ostial total occlusion RCA, dLM, & heavy calicification LM LAD CX RCA/  SVG to diagonal patent, SVG to marginal 1 patent & 2 occluded, LIMA to dLAD patent/ dCFX collateral from obtuse marginal graft/  normal LVF   TRANSTHORACIC ECHOCARDIOGRAM  05-23-2015  dr Verdis Prime   mild LVH,  grade 1 diastolic dysfunction,  ef 55-060%,  mild AV calcifacation without stenosis,  mild AR and MR,  moderate LAE,  trivial PR and TR,  mild increase pulmonary pressure    FAMILY HISTORY: Family History  Problem Relation Age of Onset   Tuberculosis Mother    CAD Father    Heart disease Sister     SOCIAL HISTORY: Social History   Socioeconomic History   Marital  status: Married    Spouse name: [name] [name]   Number of children: 2   Years of education: 12   Highest education level: Not on file  Occupational History    Comment: retired  Tobacco Use   Smoking status: Former    Years: 10.00    Types: Cigarettes    Quit date: 10/26/1966    Years since quitting: 54.6   Smokeless tobacco: Never  Vaping Use   Vaping Use: Never used  Substance and Sexual Activity   Alcohol use: No    Alcohol/week: 0.0 standard drinks   Drug use: No   Sexual activity: Not on file  Other Topics Concern   Not on file  Social History Narrative   06/09/21  lives with wife   Social Determinants of Corporate investment banker Strain: Not on file  Food Insecurity: Not on file  Transportation Needs: Not on file  Physical Activity: Not on file  Stress: Not on file  Social Connections: Not on file  Intimate Partner Violence: Not on file     PHYSICAL EXAM  GENERAL EXAM/CONSTITUTIONAL: Vitals:  Vitals:   06/09/21 1254  BP: 104/63  Pulse: (!) 46  Weight: 167 lb 3.2 oz (75.8 kg)  Height: 5\' 6"  (1.676 m)   Body mass index is 26.99 kg/m. Wt Readings from Last 3 Encounters:  06/09/21 167 lb 3.2 oz (75.8 kg)  04/23/21 189 lb 14.4 oz (86.1 kg)  06/26/20 189 lb 12.8 oz (86.1 kg)   Patient is in no distress; well developed, nourished and groomed; neck is supple  CARDIOVASCULAR: Examination of carotid arteries is normal; no carotid bruits Regular rate and rhythm, no murmurs Examination of peripheral vascular system by observation and palpation is normal  EYES: Ophthalmoscopic exam of optic discs and posterior segments is normal; no papilledema or hemorrhages No results found.  MUSCULOSKELETAL: Gait, strength, tone, movements noted in Neurologic exam below  NEUROLOGIC: MENTAL STATUS:  MMSE - Mini Mental State Exam 06/09/2021 01/15/2020  Orientation to time 3 4  Orientation to Place 4 4  Registration 3 3  Attention/ Calculation 3 1  Recall 1 2  Language- name  2 objects 2 2  Language- repeat 1 1  Language- follow 3 step command 3 3  Language- read & follow direction 1 1  Write a sentence 1 1  Copy design 1 1  Total score 23 23   awake, alert, oriented to person, place and time recent and remote memory intact DECR attention and concentration language fluent, comprehension intact, naming intact fund of knowledge appropriate AFT 13 FLAT AFFECT  CRANIAL NERVE:  2nd - no papilledema on fundoscopic exam 2nd, 3rd, 4th, 6th - pupils equal and reactive to light, visual fields full to confrontation, extraocular muscles intact, no nystagmus 5th - facial sensation symmetric 7th - facial strength symmetric 8th - hearing intact 9th - palate elevates symmetrically, uvula midline 11th - shoulder shrug symmetric 12th - tongue protrusion midline  MOTOR:  normal bulk and tone, full strength in the BUE, BLE  SENSORY:  normal and symmetric to light touch, temperature, vibration  COORDINATION:  finger-nose-finger, fine finger movements normal  REFLEXES:  deep tendon reflexes TRACE and symmetric  GAIT/STATION:  narrow based gait     DIAGNOSTIC DATA (LABS, IMAGING, TESTING) - I reviewed patient records, labs, notes, testing and imaging myself where available.  Lab Results  Component Value Date   WBC 8.3 05/10/2015   HGB 15.3 11/29/2015   HCT 45.0 11/29/2015   MCV 87.2 05/10/2015   PLT 203 05/10/2015      Component Value Date/Time   NA 141 06/29/2017 1041   K 4.9 06/29/2017 1041   CL 101 06/29/2017 1041   CO2 23 06/29/2017 1041   GLUCOSE 94 06/29/2017 1041   GLUCOSE 105 (H) 11/29/2015 1031   BUN 17 06/29/2017 1041   CREATININE 0.86 06/29/2017 1041   CALCIUM 9.7 06/29/2017 1041   GFRNONAA 84 06/29/2017 1041   GFRAA 97 06/29/2017 1041   No results found for: CHOL, HDL, LDLCALC, LDLDIRECT, TRIG, CHOLHDL No results found for: 08/29/2017 No results found for: VITAMINB12 No results found for: TSH   02/10/20 MRI brain MRI of the  brain with and without contrast shows the following:  1.   Scattered T2/FLAIR hyperintense foci in the hemispheres most consistent with mild chronic microvascular ischemic change.  None the foci appear to be acute. 2.   Mild generalized cortical atrophy that is a little more pronounced in the medial temporal lobes. 3.   There were no acute findings and there is a normal enhancement pattern.   ASSESSMENT AND PLAN  80 y.o. year old male here with gradual onset progressive short-term memory loss, decline in ADLs, repeating himself, since 2019.  MMSE 23 out of 30.  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  Dx:  1. Memory loss      PLAN:  MEMORY LOSS (suspect mild dementia) - continue memantine 10mg  twice a day  - safety / supervision issues reviewed - caregiver resources provided - caution with driving and finances  Return for return to PCP.    Suanne MarkerVIKRAM R. Darlen Gledhill, MD 06/09/2021, 1:19 PM Certified in Neurology, Neurophysiology and Neuroimaging  [facility] [address], [address] [place], [place] [id number] [phone]

## 2021-06-27 DIAGNOSIS — E538 Deficiency of other specified B group vitamins: Secondary | ICD-10-CM | POA: Diagnosis not present

## 2021-07-04 DIAGNOSIS — E7800 Pure hypercholesterolemia, unspecified: Secondary | ICD-10-CM | POA: Diagnosis not present

## 2021-07-04 DIAGNOSIS — E039 Hypothyroidism, unspecified: Secondary | ICD-10-CM | POA: Diagnosis not present

## 2021-07-04 DIAGNOSIS — E1151 Type 2 diabetes mellitus with diabetic peripheral angiopathy without gangrene: Secondary | ICD-10-CM | POA: Diagnosis not present

## 2021-07-04 DIAGNOSIS — Z7984 Long term (current) use of oral hypoglycemic drugs: Secondary | ICD-10-CM | POA: Diagnosis not present

## 2021-07-04 DIAGNOSIS — R413 Other amnesia: Secondary | ICD-10-CM | POA: Diagnosis not present

## 2021-07-04 DIAGNOSIS — I739 Peripheral vascular disease, unspecified: Secondary | ICD-10-CM | POA: Diagnosis not present

## 2021-07-04 DIAGNOSIS — N400 Enlarged prostate without lower urinary tract symptoms: Secondary | ICD-10-CM | POA: Diagnosis not present

## 2021-07-04 DIAGNOSIS — Z1389 Encounter for screening for other disorder: Secondary | ICD-10-CM | POA: Diagnosis not present

## 2021-07-04 DIAGNOSIS — Z0000 Encounter for general adult medical examination without abnormal findings: Secondary | ICD-10-CM | POA: Diagnosis not present

## 2021-07-04 DIAGNOSIS — I2510 Atherosclerotic heart disease of native coronary artery without angina pectoris: Secondary | ICD-10-CM | POA: Diagnosis not present

## 2021-07-04 DIAGNOSIS — I10 Essential (primary) hypertension: Secondary | ICD-10-CM | POA: Diagnosis not present

## 2021-07-04 DIAGNOSIS — E538 Deficiency of other specified B group vitamins: Secondary | ICD-10-CM | POA: Diagnosis not present

## 2021-07-04 DIAGNOSIS — R42 Dizziness and giddiness: Secondary | ICD-10-CM | POA: Diagnosis not present

## 2021-07-14 ENCOUNTER — Telehealth: Payer: Self-pay | Admitting: Interventional Cardiology

## 2021-07-14 NOTE — Telephone Encounter (Signed)
Left message to call back  

## 2021-07-14 NOTE — Telephone Encounter (Signed)
STAT if patient feels like he/she is going to faint   Are you dizzy now? Yesterday evening  Do you feel faint or have you passed out? Patient has dementia; wife says patient will not let her know  Do you have any other symptoms? Sleeps a lot  Have you checked your HR and BP (record if available)? 129/98

## 2021-07-15 ENCOUNTER — Ambulatory Visit: Payer: PPO | Admitting: Physician Assistant

## 2021-07-15 NOTE — Progress Notes (Deleted)
Cardiology Office Note:    Date:  07/15/2021   ID:  CORNELIO [name], DOB 1941/06/11, MRN [id number]  PCP:  [name] Dills, MD  [facility] Providers Cardiologist:  [name] Noe, MD { Click to update primary MD,subspecialty MD or APP then REFRESH:1}  *** Referring MD: [name] Dills, MD   Chief Complaint:  No chief complaint on file. {Click here for Visit Info    :1}   Patient Profile:   [name] [name] is a 80 y.o. male with:  Coronary artery disease S/p CABG in 1998 Cath 2014: 5/6 grafts patent >> med Rx  Diabetes mellitus  Peripheral arterial disease (followed by [facility]) Carotid artery dz (followed by [facility]) [place] 6/22: R 1-39; L 40-59 Hypertension  Hyperlipidemia  BPH Hypothyroidism  RBBB Sinus bradycardia  Dementia  Venous insufficiency   Prior CV studies: Carotid US 04/23/21 Right Carotid: Velocities in the right ICA are consistent with a 1-39%  stenosis.  Left Carotid: Velocities in the left ICA are consistent with a 40-59%  stenosis.   ABIs 04/23/21   Echocardiogram 05/23/15 - Left ventricle: The cavity size was mildly dilated. Wall    thickness was increased in a pattern of mild LVH. Systolic    function was normal. The estimated ejection fraction was in the    range of 55% to 60%. Wall motion was normal; there were no    regional wall motion abnormalities. Doppler parameters are    consistent with abnormal left ventricular relaxation (grade 1    diastolic dysfunction).  - Aortic valve: There was mild regurgitation.  - Aortic root: The aortic root was mildly dilated.  - Mitral valve: There was mild regurgitation.  - Left atrium: The atrium was moderately dilated.  - Pulmonary arteries: Systolic pressure was mildly increased.   Impressions:   - Normal LV function; mild LVH; grade 1 diastolic dysfunction; mild    AI and MR; moderate LAE; trace TR with mildly elevated pulmonary    pressure.   Cardiac catheterization 06/16/13 IMPRESSIONS:   1. Widely  patent saphenous vein grafts to the PDA/RV branch, the first diagonal, and the first obtuse marginal. The continuation limb to the second obtuse marginal is totally occluded. 2. Widely patent LIMA to the distal LAD 3. Severe native vessel occlusive disease with ostial total occlusion of the right coronary, distal 99% occlusion of the left main, and evidence of heavy-use coronary calcification in the left main LAD circumflex and right coronary. I suspect that the progression in symptoms are related to occlusion of the left main then now leave the proximal LAD territory and the distal circumflex territory dependent on retrograde flow via the saphenous vein graft to the diagonal. 4. The distal circumflex is collateralized from the obtuse marginal graft. 5. Normal LV function   RECOMMENDATION:   Medical therapy.  History of Present Illness: [name] [name] was last seen by Dr. Katrinka [name] in 9/21.   His wife called in yesterday to report dizziness.  He is seen for evaluation.  ***       Past Medical History:  Diagnosis Date   Benign prostatic hypertrophy with lower urinary tract symptoms (LUTS)    Bilateral edema of lower extremity    IMPROVED W/ TED HOSE   CAD (coronary artery disease)    cardiologist-  dr Verdis Prime--  s/p  cabg x6 in 1998   Heart murmur    Hiatal hernia    History of kidney stones    Hydronephrosis, left  Hyperlipidemia    Hypertension    Hypothyroidism    Left ureteral stone    Memory difficulties    Nephrolithiasis    right side per ct  non-obstructive   PVD (peripheral vascular disease) (HCC)    RBBB (right bundle branch block)    S/P CABG x 6    1998   Sigmoid diverticulosis    Sinus bradycardia    Type 2 diabetes mellitus (HCC)    Current Medications: No outpatient medications have been marked as taking for the 07/15/21 encounter (Appointment) with [name] [name] T, PA-C.    Allergies:   Spironolactone and Penicillins   Social History   Tobacco Use   Smoking  status: Former    Years: 10.00    Types: Cigarettes    Quit date: 10/26/1966    Years since quitting: 54.7   Smokeless tobacco: Never  Vaping Use   Vaping Use: Never used  Substance Use Topics   Alcohol use: No    Alcohol/week: 0.0 standard drinks   Drug use: No    Family Hx: The patient's family history includes CAD in his father; Heart disease in his sister; Tuberculosis in his mother.  ROS   EKGs/Labs/Other Test Reviewed:    EKG:  EKG is *** ordered today.  The ekg ordered today demonstrates ***  Recent Labs: No results found for requested labs within last 8760 hours.   Recent Lipid Panel No results found for: CHOL, TRIG, HDL, LDLCALC, LDLDIRECT   Risk Assessment/Calculations:   {Does this patient have ATRIAL FIBRILLATION?:[id number]}      Physical Exam:    VS:  There were no vitals taken for this visit.    Wt Readings from Last 3 Encounters:  06/09/21 167 lb 3.2 oz (75.8 kg)  04/23/21 189 lb 14.4 oz (86.1 kg)  06/26/20 189 lb 12.8 oz (86.1 kg)    Physical Exam ***     ASSESSMENT & PLAN:   {Select for Dx:25819}    {Are you ordering a CV Procedure (e.g. stress test, cath, DCCV, TEE, etc)?   Press F2        :[id number]}   Dispo:  No follow-ups on file.   Medication Adjustments/Labs and Tests Ordered: Current medicines are reviewed at length with the patient today.  Concerns regarding medicines are outlined above.  Tests Ordered: No orders of the defined types were placed in this encounter.  Medication Changes: No orders of the defined types were placed in this encounter.  Signed, [name] Newcomer, PA-C  07/15/2021 7:57 AM    [facility] [address] [place], [place], [place]  [address] Phone: [phone]; Fax: [phone]

## 2021-07-16 ENCOUNTER — Encounter (HOSPITAL_COMMUNITY): Payer: Self-pay | Admitting: Emergency Medicine

## 2021-07-16 ENCOUNTER — Observation Stay (HOSPITAL_BASED_OUTPATIENT_CLINIC_OR_DEPARTMENT_OTHER): Payer: PPO

## 2021-07-16 ENCOUNTER — Observation Stay (HOSPITAL_COMMUNITY)
Admission: EM | Admit: 2021-07-16 | Discharge: 2021-07-17 | Disposition: A | Discharge status: Home / Self Care | Payer: PPO | Attending: Internal Medicine | Admitting: Internal Medicine

## 2021-07-16 ENCOUNTER — Other Ambulatory Visit (HOSPITAL_COMMUNITY): Payer: Self-pay

## 2021-07-16 ENCOUNTER — Emergency Department (HOSPITAL_COMMUNITY): Payer: PPO

## 2021-07-16 ENCOUNTER — Other Ambulatory Visit: Payer: Self-pay

## 2021-07-16 DIAGNOSIS — E119 Type 2 diabetes mellitus without complications: Secondary | ICD-10-CM | POA: Insufficient documentation

## 2021-07-16 DIAGNOSIS — R531 Weakness: Secondary | ICD-10-CM | POA: Diagnosis not present

## 2021-07-16 DIAGNOSIS — E039 Hypothyroidism, unspecified: Secondary | ICD-10-CM | POA: Diagnosis not present

## 2021-07-16 DIAGNOSIS — Z79899 Other long term (current) drug therapy: Secondary | ICD-10-CM | POA: Diagnosis not present

## 2021-07-16 DIAGNOSIS — Z87891 Personal history of nicotine dependence: Secondary | ICD-10-CM | POA: Insufficient documentation

## 2021-07-16 DIAGNOSIS — R404 Transient alteration of awareness: Secondary | ICD-10-CM | POA: Diagnosis not present

## 2021-07-16 DIAGNOSIS — R42 Dizziness and giddiness: Secondary | ICD-10-CM | POA: Diagnosis not present

## 2021-07-16 DIAGNOSIS — I959 Hypotension, unspecified: Secondary | ICD-10-CM | POA: Diagnosis not present

## 2021-07-16 DIAGNOSIS — R001 Bradycardia, unspecified: Secondary | ICD-10-CM | POA: Diagnosis not present

## 2021-07-16 DIAGNOSIS — F0390 Unspecified dementia without behavioral disturbance: Secondary | ICD-10-CM | POA: Insufficient documentation

## 2021-07-16 DIAGNOSIS — I4430 Unspecified atrioventricular block: Secondary | ICD-10-CM | POA: Insufficient documentation

## 2021-07-16 DIAGNOSIS — I4891 Unspecified atrial fibrillation: Secondary | ICD-10-CM | POA: Diagnosis not present

## 2021-07-16 DIAGNOSIS — I2510 Atherosclerotic heart disease of native coronary artery without angina pectoris: Secondary | ICD-10-CM | POA: Diagnosis not present

## 2021-07-16 DIAGNOSIS — R55 Syncope and collapse: Secondary | ICD-10-CM | POA: Diagnosis not present

## 2021-07-16 DIAGNOSIS — Z951 Presence of aortocoronary bypass graft: Secondary | ICD-10-CM | POA: Insufficient documentation

## 2021-07-16 DIAGNOSIS — I10 Essential (primary) hypertension: Secondary | ICD-10-CM | POA: Insufficient documentation

## 2021-07-16 DIAGNOSIS — Z20822 Contact with and (suspected) exposure to covid-19: Secondary | ICD-10-CM | POA: Diagnosis not present

## 2021-07-16 DIAGNOSIS — Z7984 Long term (current) use of oral hypoglycemic drugs: Secondary | ICD-10-CM | POA: Diagnosis not present

## 2021-07-16 DIAGNOSIS — Z950 Presence of cardiac pacemaker: Secondary | ICD-10-CM

## 2021-07-16 DIAGNOSIS — E1151 Type 2 diabetes mellitus with diabetic peripheral angiopathy without gangrene: Secondary | ICD-10-CM

## 2021-07-16 LAB — COMPREHENSIVE METABOLIC PANEL
ALT: 12 U/L (ref 0–44)
AST: 16 U/L (ref 15–41)
Albumin: 2.9 g/dL — ABNORMAL LOW (ref 3.5–5.0)
Alkaline Phosphatase: 80 U/L (ref 38–126)
Anion gap: 8 (ref 5–15)
BUN: 32 mg/dL — ABNORMAL HIGH (ref 8–23)
CO2: 24 mmol/L (ref 22–32)
Calcium: 9.3 mg/dL (ref 8.9–10.3)
Chloride: 105 mmol/L (ref 98–111)
Creatinine, Ser: 1.12 mg/dL (ref 0.61–1.24)
GFR, Estimated: >60 mL/min (ref >60)
Glucose, Bld: 115 mg/dL — ABNORMAL HIGH (ref 70–99)
Potassium: 4.2 mmol/L (ref 3.5–5.1)
Sodium: 137 mmol/L (ref 135–145)
Total Bilirubin: 0.5 mg/dL (ref 0.3–1.2)
Total Protein: 6.1 g/dL — ABNORMAL LOW (ref 6.5–8.1)

## 2021-07-16 LAB — CBG MONITORING, ED
Glucose-Capillary: 129 mg/dL — ABNORMAL HIGH (ref 70–99)
Glucose-Capillary: 153 mg/dL — ABNORMAL HIGH (ref 70–99)

## 2021-07-16 LAB — CBC
HCT: 34.8 % — ABNORMAL LOW (ref 39.0–52.0)
Hemoglobin: 11.6 g/dL — ABNORMAL LOW (ref 13.0–17.0)
MCH: 30.1 pg (ref 26.0–34.0)
MCHC: 33.3 g/dL (ref 30.0–36.0)
MCV: 90.4 fL (ref 80.0–100.0)
Platelets: 222 10*3/uL (ref 150–400)
RBC: 3.85 MIL/uL — ABNORMAL LOW (ref 4.22–5.81)
RDW: 14.4 % (ref 11.5–15.5)
WBC: 7.7 10*3/uL (ref 4.0–10.5)
nRBC: 0 % (ref 0.0–0.2)

## 2021-07-16 LAB — RESP PANEL BY RT-PCR (FLU A&B, COVID) ARPGX2
Influenza A by PCR: NEGATIVE
Influenza B by PCR: NEGATIVE
SARS Coronavirus 2 by RT PCR: NEGATIVE

## 2021-07-16 LAB — PROTIME-INR
INR: 1.1 (ref 0.8–1.2)
Prothrombin Time: 14.7 seconds (ref 11.4–15.2)

## 2021-07-16 LAB — APTT: aPTT: 31 seconds (ref 24–36)

## 2021-07-16 LAB — TROPONIN I (HIGH SENSITIVITY): Troponin I (High Sensitivity): 70 ng/L — ABNORMAL HIGH (ref <18)

## 2021-07-16 MED ORDER — ATORVASTATIN CALCIUM 40 MG PO TABS
40.0000 mg | ORAL_TABLET | Freq: Every morning | ORAL | Status: DC
  Administered 2021-07-16: 40 mg via ORAL
  Filled 2021-07-16: qty 1

## 2021-07-16 MED ORDER — METFORMIN HCL 500 MG PO TABS
500.0000 mg | ORAL_TABLET | Freq: Every day | ORAL | Status: DC
  Filled 2021-07-16 (×2): qty 1

## 2021-07-16 MED ORDER — HYDROCHLOROTHIAZIDE 25 MG PO TABS
25.0000 mg | ORAL_TABLET | Freq: Every morning | ORAL | Status: DC
  Filled 2021-07-16: qty 1

## 2021-07-16 MED ORDER — SODIUM CHLORIDE 0.9% FLUSH
3.0000 mL | Freq: Two times a day (BID) | INTRAVENOUS | Status: DC
  Administered 2021-07-16: 3 mL via INTRAVENOUS

## 2021-07-16 MED ORDER — HEPARIN (PORCINE) 25000 UT/250ML-% IV SOLN
850.0000 [IU]/h | INTRAVENOUS | Status: DC
  Administered 2021-07-16: 850 [IU]/h via INTRAVENOUS
  Filled 2021-07-16: qty 250

## 2021-07-16 MED ORDER — LEVOTHYROXINE SODIUM 100 MCG PO TABS
100.0000 ug | ORAL_TABLET | Freq: Every day | ORAL | Status: DC
  Administered 2021-07-17: 100 ug via ORAL
  Filled 2021-07-16: qty 1

## 2021-07-16 MED ORDER — POTASSIUM CHLORIDE CRYS ER 10 MEQ PO TBCR
10.0000 meq | EXTENDED_RELEASE_TABLET | Freq: Every morning | ORAL | Status: DC
  Filled 2021-07-16: qty 1

## 2021-07-16 MED ORDER — AMLODIPINE BESYLATE 5 MG PO TABS
5.0000 mg | ORAL_TABLET | Freq: Every day | ORAL | Status: DC
  Administered 2021-07-16: 5 mg via ORAL
  Filled 2021-07-16 (×2): qty 1

## 2021-07-16 MED ORDER — TAMSULOSIN HCL 0.4 MG PO CAPS
0.4000 mg | ORAL_CAPSULE | Freq: Every day | ORAL | Status: DC
  Administered 2021-07-16: 0.4 mg via ORAL
  Filled 2021-07-16 (×2): qty 1

## 2021-07-16 MED ORDER — MEMANTINE HCL 10 MG PO TABS
10.0000 mg | ORAL_TABLET | Freq: Two times a day (BID) | ORAL | Status: DC
  Administered 2021-07-16: 10 mg via ORAL
  Filled 2021-07-16 (×3): qty 1

## 2021-07-16 MED ORDER — HEPARIN SODIUM (PORCINE) 5000 UNIT/ML IJ SOLN: 5000.0000 [IU] | Freq: Three times a day (TID) | INTRAMUSCULAR | Status: DC

## 2021-07-16 MED ORDER — FINASTERIDE 5 MG PO TABS
5.0000 mg | ORAL_TABLET | Freq: Every morning | ORAL | Status: DC
  Administered 2021-07-16: 5 mg via ORAL
  Filled 2021-07-16: qty 1

## 2021-07-16 MED ORDER — INSULIN ASPART 100 UNIT/ML IJ SOLN
0.0000 [IU] | Freq: Three times a day (TID) | INTRAMUSCULAR | Status: DC
  Administered 2021-07-16: 1 [IU] via SUBCUTANEOUS

## 2021-07-16 MED ORDER — LISINOPRIL 20 MG PO TABS
40.0000 mg | ORAL_TABLET | Freq: Every morning | ORAL | Status: DC
  Filled 2021-07-16: qty 2

## 2021-07-16 MED ORDER — SODIUM CHLORIDE 0.9 % IV BOLUS
500.0000 mL | Freq: Once | INTRAVENOUS | Status: AC
  Administered 2021-07-16: 500 mL via INTRAVENOUS

## 2021-07-16 MED ORDER — HEPARIN BOLUS VIA INFUSION
3000.0000 [IU] | Freq: Once | INTRAVENOUS | Status: AC
  Administered 2021-07-16: 3000 [IU] via INTRAVENOUS
  Filled 2021-07-16: qty 3000

## 2021-07-16 NOTE — Progress Notes (Signed)
Cardiology will decide whether to proceed with PPM in AM, recommend D/C heparin drip from now on.   D/C heparin, start SCD.

## 2021-07-16 NOTE — ED Provider Notes (Signed)
[facility] EMERGENCY DEPARTMENT Provider Note   CSN: [id number] Arrival date & time:        History Chief Complaint  Patient presents with   Dizziness    [name] [name] is a 80 y.o. male.  HPI Level 5 caveat secondary to dementia Initial history obtained from EMS 80 year old male history of dementia, coronary artery disease, hypertension, presents today with lightheadedness.  Per EMS report, patient has been feeling lightheaded.  He is seen his physician, Dr. Nehemiah Settle, twice for this.  On his first evaluation in August.  EKG was performed that showed a normal sinus rhythm.  He read presented today with his wife.  They did an EKG that showed a heart rate of 39 with new onset of A. fib.  EMS was called secondary to this.  Patient currently denies any symptoms to me he does not recall exactly why he was sent here.  He is oriented to where he is and to the year.     Past Medical History:  Diagnosis Date   Benign prostatic hypertrophy with lower urinary tract symptoms (LUTS)    Bilateral edema of lower extremity    IMPROVED W/ TED HOSE   CAD (coronary artery disease)    cardiologist-  dr Verdis Prime--  s/p  cabg x6 in 1998   Heart murmur    Hiatal hernia    History of kidney stones    Hydronephrosis, left    Hyperlipidemia    Hypertension    Hypothyroidism    Left ureteral stone    Memory difficulties    Nephrolithiasis    right side per ct  non-obstructive   PVD (peripheral vascular disease) (HCC)    RBBB (right bundle branch block)    S/P CABG x 6    1998   Sigmoid diverticulosis    Sinus bradycardia    Type 2 diabetes mellitus (HCC)     Patient Active Problem List   Diagnosis Date Noted   Diminished pulse 04/23/2021   Heart murmur, aortic 04/23/2021   Amnesia 04/23/2021   Benign prostatic hyperplasia 04/23/2021   Coronary ostial stenosis 04/23/2021   Other specified counseling 04/23/2021   Atherosclerotic heart disease of native coronary  artery without angina pectoris 04/23/2021   Mild mitral regurgitation 05/22/2015   Chronic venous insufficiency 12/07/2014   Type 2 diabetes mellitus with peripheral artery disease (HCC) 12/07/2014   Peripheral vascular disease, unspecified (HCC) 11/17/2013   Essential hypertension, benign 11/17/2013   CAD (coronary artery disease) of artery bypass graft 11/17/2013    Class: Chronic   Obesity, unspecified 11/17/2013   Pure hypercholesterolemia 11/17/2013   Unspecified hypothyroidism 11/17/2013   Type II or unspecified type diabetes mellitus without mention of complication, not stated as uncontrolled 11/17/2013   Right bundle branch block 11/17/2013   Nonspecific abnormal electrocardiogram (ECG) (EKG) 11/17/2013   Thyrotoxicosis without mention of goiter or other cause, without mention of thyrotoxic crisis or storm 11/17/2013   Pain in joint, shoulder region 11/17/2013   Abnormal nuclear stress test 06/16/2013   Exertional angina (HCC) 06/16/2013    Past Surgical History:  Procedure Laterality Date   CATARACT EXTRACTION W/ INTRAOCULAR LENS  IMPLANT, BILATERAL  2011   CORONARY ARTERY BYPASS GRAFT  1998   CABG X 6 --   LIMA  to the LAD and  other undefined SVG's   CYSTOSCOPY WITH BIOPSY N/A 11/29/2015   Procedure: CYSTOSCOPY WITH BIOPSY AND FULGERATION;  Surgeon: Sebastian Ache, MD;  Location: [facility];  Service: Urology;  Laterality: N/A;   CYSTOSCOPY/RETROGRADE/URETEROSCOPY/STONE EXTRACTION WITH BASKET Left 11/29/2015   Procedure: CYSTOSCOPY/RETROGRADE/DIAGNOSTIC URETEROSCOPY/STONE EXTRACTION;  Surgeon: Sebastian Ache, MD;  Location: [facility];  Service: Urology;  Laterality: Left;   EXTRACORPOREAL SHOCK WAVE LITHOTRIPSY Left 08-12-2015   LEFT HEART CATHETERIZATION WITH CORONARY/GRAFT ANGIOGRAM  06/16/2013   Procedure: LEFT HEART CATHETERIZATION WITH Isabel Caprice;  Surgeon: Lesleigh Noe, MD;  Location: [facility];  Service: Cardiovascular;  Native vessel occlusive disease w/ ostial total occlusion RCA, dLM, & heavy calicification LM LAD CX RCA/  SVG to diagonal patent, SVG to marginal 1 patent & 2 occluded, LIMA to dLAD patent/ dCFX collateral from obtuse marginal graft/  normal LVF   TRANSTHORACIC ECHOCARDIOGRAM  05-23-2015  dr Verdis Prime   mild LVH,  grade 1 diastolic dysfunction,  ef 55-060%,  mild AV calcifacation without stenosis,  mild AR and MR,  moderate LAE,  trivial PR and TR,  mild increase pulmonary pressure       Family History  Problem Relation Age of Onset   Tuberculosis Mother    CAD Father    Heart disease Sister     Social History   Tobacco Use   Smoking status: Former    Years: 10.00    Types: Cigarettes    Quit date: 10/26/1966    Years since quitting: 54.7   Smokeless tobacco: Never  Vaping Use   Vaping Use: Never used  Substance Use Topics   Alcohol use: No    Alcohol/week: 0.0 standard drinks   Drug use: No    Home Medications Prior to Admission medications   Medication Sig Start Date End Date Taking? Authorizing Provider  amLODipine (NORVASC) 5 MG tablet Take 5 mg by mouth daily. 04/05/17   [provider]  aspirin EC 81 MG tablet Take 1 tablet (81 mg total) by mouth daily. 06/01/17   Lyn Records, MD  atorvastatin (LIPITOR) 80 MG tablet Take 40 mg by mouth every morning.     [provider]  Cyanocobalamin (B-12 IJ) Use as directed monthly.    [provider]  finasteride (PROSCAR) 5 MG tablet Take 5 mg by mouth every morning.     [provider]  Flaxseed, Linseed, (FLAX SEEDS PO) Take 1 capsule by mouth daily.    [provider]  hydrochlorothiazide (HYDRODIURIL) 25 MG tablet Take 25 mg by mouth every morning.     [provider]  levothyroxine (SYNTHROID, LEVOTHROID) 100 MCG tablet Take 100 mcg by mouth daily before breakfast. 05/03/17   [provider]  lisinopril (PRINIVIL,ZESTRIL) 40 MG tablet Take 40 mg by mouth every  morning.     [provider]  memantine (NAMENDA) 10 MG tablet Take 1 tablet (10 mg total) by mouth 2 (two) times daily. 06/09/21   Penumalli, Glenford Bayley, MD  metFORMIN (GLUCOPHAGE) 500 MG tablet Take 500 mg by mouth daily.     [provider]  metoprolol succinate (TOPROL-XL) 25 MG 24 hr tablet Take 1 tablet (25 mg total) by mouth daily. 06/26/20   Lyn Records, MD  nitroGLYCERIN (NITROSTAT) 0.4 MG SL tablet DISSOLVE ONE TABLET UNDER THE TONGUE EVERY 5 MINUTES AS NEEDED FOR CHEST PAIN 06/29/17   Lyn Records, MD  potassium chloride SA (K-DUR,KLOR-CON) 20 MEQ tablet Take 10 mEq by mouth every morning.     [provider]  tamsulosin (FLOMAX) 0.4 MG CAPS capsule Take 0.4 mg by mouth daily. 03/09/16  [provider]    Allergies    Spironolactone and Penicillins  Review of Systems   Review of Systems  Unable to perform ROS: Dementia   Physical Exam Updated Vital Signs BP (!) 153/66   Pulse (!) 44   Temp 98.2 F (36.8 C) (Oral)   Resp 18   Ht 1.676 m (5\' 6" )   Wt 72.6 kg   SpO2 99%   BMI 25.82 kg/m   Physical Exam Vitals and nursing note reviewed.  Constitutional:      Appearance: Normal appearance.  HENT:     Head: Normocephalic and atraumatic.     Right Ear: External ear normal.     Left Ear: External ear normal.     Nose: Nose normal.     Mouth/Throat:     Mouth: Mucous membranes are moist.  Eyes:     Extraocular Movements: Extraocular movements intact.     Pupils: Pupils are equal, round, and reactive to light.  Cardiovascular:     Rate and Rhythm: Bradycardia present. Rhythm irregular.     Pulses: Normal pulses.     Heart sounds: Normal heart sounds.  Pulmonary:     Effort: Pulmonary effort is normal.     Breath sounds: Normal breath sounds.  Abdominal:     General: Abdomen is flat. Bowel sounds are normal.     Palpations: Abdomen is soft.  Musculoskeletal:        General: No swelling or tenderness. Normal range of motion.      Cervical back: Normal range of motion.     Right lower leg: No edema.     Left lower leg: No edema.  Skin:    General: Skin is warm and dry.     Capillary Refill: Capillary refill takes less than 2 seconds.     Coloration: Skin is pale.  Neurological:     General: No focal deficit present.     Mental Status: He is alert.     Cranial Nerves: No cranial nerve deficit.     Motor: No weakness.     Coordination: Coordination normal.     Gait: Gait normal.     Deep Tendon Reflexes: Reflexes normal.  Psychiatric:        Mood and Affect: Mood normal.    ED Results / Procedures / Treatments   Labs (all labs ordered are listed, but only abnormal results are displayed) Labs Reviewed  CBC - Abnormal; Notable for the following components:      Result Value   RBC 3.85 (*)    Hemoglobin 11.6 (*)    HCT 34.8 (*)    All other components within normal limits  COMPREHENSIVE METABOLIC PANEL - Abnormal; Notable for the following components:   Glucose, Bld 115 (*)    BUN 32 (*)    Total Protein 6.1 (*)    Albumin 2.9 (*)    All other components within normal limits  RESP PANEL BY RT-PCR (FLU A&B, COVID) [id number]    EKG EKG Interpretation  Date/Time:  Wednesday July 16 2021 10:42:50 EDT Ventricular Rate:  43 PR Interval:    QRS Duration: 159 QT Interval:  484 QTC Calculation: 410 R Axis:   1 Text Interpretation: Atrial fibrillation Right bundle branch block No old tracing to compare Confirmed by 03-07-1990 [phone]) on 07/16/2021 10:45:32 AM  Radiology DG Chest Port 1 View  Result Date: 07/16/2021 CLINICAL DATA:  Dizziness. EXAM: PORTABLE CHEST 1 VIEW COMPARISON:  June 13, 2013. FINDINGS:  Stable cardiomediastinal silhouette. Status post coronary bypass graft. No acute pulmonary disease is noted. Bony thorax is unremarkable. IMPRESSION: No active disease. Electronically Signed   By: Lupita Raider M.D.   On: 07/16/2021 11:36    Procedures Procedures   Medications Ordered in  ED Medications  sodium chloride 0.9 % bolus 500 mL (has no administration in time range)    ED Course  I have reviewed the triage vital signs and the nursing notes.  Pertinent labs & imaging results that were available during my care of the patient were reviewed by me and considered in my medical decision making (see chart for details).    MDM Rules/Calculators/A&P                          80 year old man history of coronary artery disease, status post CABG, on Lopressor presents today with generalized weakness and new onset of bradycardia.  Patient has new onset A. fib with ventricular rates around 40.  Patient has new onset of A. fib and is not currently anticoagulated.  He is currently active at home.  Will heparinize in ED. patient's bradycardia may be secondary to his beta-blocker.  Will hold beta-blocker. Discussed with cardiology and they will see for consult Plan consult with medicine for admission Discussed with Dr. Chipper Herb who will see for admission  Final Clinical Impression(s) / ED Diagnoses Final diagnoses:  Lightheadedness  New onset atrial fibrillation (HCC)  Bradycardia    Rx / DC Orders ED Discharge Orders     None        Margarita Grizzle, MD 07/16/21 1224

## 2021-07-16 NOTE — TOC Benefit Eligibility Note (Signed)
Patient Product/process development scientist completed.    The patient is currently admitted and upon discharge could be taking Elquis 5 mg.  The current 30 day co-pay is, $45.00.   The patient is insured through [employer] Medicare Part D     Roland Earl, CPhT Pharmacy Patient Advocate Specialist [facility] Antimicrobial Stewardship Team Direct Number: [phone]  Fax: [phone]

## 2021-07-16 NOTE — ED Notes (Signed)
Resting in stretcher, echo at bedside, HR remains in the 40s. Asymptomatic, no distress noted. Pharmacy has not verified meds currently due, cannot administer.

## 2021-07-16 NOTE — Progress Notes (Signed)
ANTICOAGULATION CONSULT NOTE - Initial Consult  Pharmacy Consult for heparin Indication: atrial fibrillation  Allergies  Allergen Reactions   Spironolactone Rash   Penicillins Rash    Has patient had a PCN reaction causing immediate rash, facial/tongue/throat swelling, SOB or lightheadedness with hypotension:no Has patient had a PCN reaction causing severe rash involving mucus membranes or skin necrosis: no Has patient had a PCN reaction that required hospitalization no Has patient had a PCN reaction occurring within the last 10 years: no If all of the above answers are "NO", then may proceed with Cephalosporin use.     Patient Measurements: Height: 5\' 6"  (167.6 cm) Weight: 72.6 kg (160 lb) IBW/kg (Calculated) : 63.8 Heparin Dosing Weight: 72.6 kg  Vital Signs: Temp: 98.2 F (36.8 C) (09/21 1040) Temp Source: Oral (09/21 1040) BP: 153/66 (09/21 1215) Pulse Rate: 44 (09/21 1215)  Labs: Recent Labs    07/16/21 1050  HGB 11.6*  HCT 34.8*  PLT 222  CREATININE 1.12    Estimated Creatinine Clearance: 46.7 mL/min (by C-G formula based on SCr of 1.12 mg/dL).   Medical History: Past Medical History:  Diagnosis Date   Benign prostatic hypertrophy with lower urinary tract symptoms (LUTS)    Bilateral edema of lower extremity    IMPROVED W/ TED HOSE   CAD (coronary artery disease)    cardiologist-  dr 07/18/21--  s/p  cabg x6 in 1998   Heart murmur    Hiatal hernia    History of kidney stones    Hydronephrosis, left    Hyperlipidemia    Hypertension    Hypothyroidism    Left ureteral stone    Memory difficulties    Nephrolithiasis    right side per ct  non-obstructive   PVD (peripheral vascular disease) (HCC)    RBBB (right bundle branch block)    S/P CABG x 6    1998   Sigmoid diverticulosis    Sinus bradycardia    Type 2 diabetes mellitus (HCC)     Medications: see MAR  Assessment: 80 yo M with new onset Afib w/ RVR. Cardiology consulted. Heparin  consult for [facility]. No AC PTA. CBC wnl.  Goal of Therapy:  Heparin level 0.3-0.7 units/ml Monitor platelets by anticoagulation protocol: Yes   Plan:  Give 3000 units bolus x 1 Start heparin infusion at 850 units/hr Check anti-Xa level in 8 hours and daily while on heparin Continue to monitor H&H and platelets  [facility], PharmD, [facility] Emergency Medicine Clinical Pharmacist ED RPh Phone: [phone] Main RX: [phone]

## 2021-07-16 NOTE — Consult Note (Addendum)
Cardiology Consultation:   Patient ID: [name] MRN: [id number]; DOB: May 03, 1941  Admit date: 07/16/2021 Date of Consult: 07/16/2021  PCP:  Renford Dills, MD   [facility] Providers Cardiologist:  Lesleigh Noe, MD   {   Patient Profile:   [name] [name] is a 80 y.o. male with a hx of CAD with hx of CABG x 6 in 1998, RBBB, sinus bradycardia, type 2 DM, chronic venous insufficiency, PAD, asymptomatic carotid stenosis, HTN, HLD, hypothyroidism, memory loss with suspected mild dementia, BPH, who is being seen 07/16/2021 for the evaluation of sinus bradycardia and new onset of atrial fibrillation at the request of Dr. Chipper Herb.   History of Present Illness:   [name] follows Dr. Katrinka Blazing outpatient, had CABG x6 in 1998, last cardiac cath from 06/16/13 showed patent SVG to PDA/RV branch, first diagonal, first OM; the continuation limb to second OM is totally occluded; patent LIMA to distal LAD; severe native vessel occlusive disease with ostial total occlusion of right coronary, distal 99% occlusion of left main, evidence of heavy use coronary calcification in the left main LAD circumflex and right coronary; distal circumflex is collateralized from the OM graft. Normal LV function.   Last Echo from 05/23/15 showed EF 55 to 60%, mild LVH, no RWMA, grade I DD, mild AR, mild dilatation of aortic root 76mm, mild MR, moderately dilated LA, mildly increased PASP with RVSP 29 mmHg.   He was last seen on 06/26/20 in the office, had angina when mowing grass in the hear of the day, no resting angina. EKG showed SB 46 bpm, RBBB, LAD, small inferior Q waves, no acute changes from 06/2019. His metoprolol succinate was decreased from 50 to 25 mg daily due to bradycardia and low blood pressure, otherwise he was continued on ASA , lisinopril, and lipitor for CAD.   He presented to ER today c/o generalized weakness and near syncope episodes for 2 weeks. He reports intermittent lightheadedness, blurry vision,  and episodes that he felt needed to sit down. He states lightheadedness occurs randomly throughout the day, not related to chest pain or pressure, SOB, exertional activity. He states he had mowed his lawn this past week and tolerated it well without any angina symptoms. He denied any heart palpitation, syncope, or irregular heart beat sensation. He had a recent PCP visit on 9/9 where his metoprolol was reduced to 12.5mg  due to bradycardia. It did not improve his weakness and lightheadedness and he visited his PCP again today and was instructed going to ED. He denied any fever, chills, coughing, diarrhea, emesis, dysuria, or any major hx of bleeding. Historian is limited due to mild memory loss.   Admission diagnostics showed grossly unremarkable CMP, Hgb 11.6 on CBC, INR 1.1. Flu and COVID negative. CXR showed no acute findings. EKG showed A fib with slowed ventricular rate of 43 bpm, old RBBB.  He was bradycardic with heart rate 40s, normotensive, nonhypoxic at ED. He was admitted to hospital medicine service, Echo is pending, metoprolol discontinued, heparin gtt was started for CHA2DS2-VASc score 6, and cardiology is consulted for further input.     Past Medical History:  Diagnosis Date   Benign prostatic hypertrophy with lower urinary tract symptoms (LUTS)    Bilateral edema of lower extremity    IMPROVED W/ TED HOSE   CAD (coronary artery disease)    cardiologist-  dr Verdis Prime--  s/p  cabg x6 in 1998   Heart murmur    Hiatal hernia  History of kidney stones    Hydronephrosis, left    Hyperlipidemia    Hypertension    Hypothyroidism    Left ureteral stone    Memory difficulties    Nephrolithiasis    right side per ct  non-obstructive   PVD (peripheral vascular disease) (HCC)    RBBB (right bundle branch block)    S/P CABG x 6    1998   Sigmoid diverticulosis    Sinus bradycardia    Type 2 diabetes mellitus (HCC)     Past Surgical History:  Procedure Laterality Date    CATARACT EXTRACTION W/ INTRAOCULAR LENS  IMPLANT, BILATERAL  2011   CORONARY ARTERY BYPASS GRAFT  1998   CABG X 6 --   LIMA  to the LAD and  other undefined SVG's   CYSTOSCOPY WITH BIOPSY N/A 11/29/2015   Procedure: CYSTOSCOPY WITH BIOPSY AND FULGERATION;  Surgeon: Sebastian Ache, MD;  Location: [facility];  Service: Urology;  Laterality: N/A;   CYSTOSCOPY/RETROGRADE/URETEROSCOPY/STONE EXTRACTION WITH BASKET Left 11/29/2015   Procedure: CYSTOSCOPY/RETROGRADE/DIAGNOSTIC URETEROSCOPY/STONE EXTRACTION;  Surgeon: Sebastian Ache, MD;  Location: [facility];  Service: Urology;  Laterality: Left;   EXTRACORPOREAL SHOCK WAVE LITHOTRIPSY Left 08-12-2015   LEFT HEART CATHETERIZATION WITH CORONARY/GRAFT ANGIOGRAM  06/16/2013   Procedure: LEFT HEART CATHETERIZATION WITH Isabel Caprice;  Surgeon: Lesleigh Noe, MD;  Location: [facility] CATH LAB;  Service: Cardiovascular; Native vessel occlusive disease w/ ostial total occlusion RCA, dLM, & heavy calicification LM LAD CX RCA/  SVG to diagonal patent, SVG to marginal 1 patent & 2 occluded, LIMA to dLAD patent/ dCFX collateral from obtuse marginal graft/  normal LVF   TRANSTHORACIC ECHOCARDIOGRAM  05-23-2015  dr Verdis Prime   mild LVH,  grade 1 diastolic dysfunction,  ef 55-060%,  mild AV calcifacation without stenosis,  mild AR and MR,  moderate LAE,  trivial PR and TR,  mild increase pulmonary pressure     Home Medications:  Prior to Admission medications   Medication Sig Start Date End Date Taking? Authorizing Provider  amLODipine (NORVASC) 5 MG tablet Take 5 mg by mouth daily. 04/05/17   [provider]  aspirin EC 81 MG tablet Take 1 tablet (81 mg total) by mouth daily. 06/01/17   Lyn Records, MD  atorvastatin (LIPITOR) 80 MG tablet Take 40 mg by mouth every morning.     [provider]  Cyanocobalamin (B-12 IJ) Use as directed monthly.    [provider]  finasteride (PROSCAR) 5 MG tablet Take 5  mg by mouth every morning.     [provider]  Flaxseed, Linseed, (FLAX SEEDS PO) Take 1 capsule by mouth daily.    [provider]  hydrochlorothiazide (HYDRODIURIL) 25 MG tablet Take 25 mg by mouth every morning.     [provider]  levothyroxine (SYNTHROID, LEVOTHROID) 100 MCG tablet Take 100 mcg by mouth daily before breakfast. 05/03/17   [provider]  lisinopril (PRINIVIL,ZESTRIL) 40 MG tablet Take 40 mg by mouth every morning.     [provider]  memantine (NAMENDA) 10 MG tablet Take 1 tablet (10 mg total) by mouth 2 (two) times daily. 06/09/21   Penumalli, Glenford Bayley, MD  metFORMIN (GLUCOPHAGE) 500 MG tablet Take 500 mg by mouth daily.     [provider]  metoprolol succinate (TOPROL-XL) 25 MG 24 hr tablet Take 1 tablet (25 mg total) by mouth daily. 06/26/20   Lyn Records, MD  nitroGLYCERIN (NITROSTAT)  0.4 MG SL tablet DISSOLVE ONE TABLET UNDER THE TONGUE EVERY 5 MINUTES AS NEEDED FOR CHEST PAIN 06/29/17   Lyn Records, MD  potassium chloride SA (K-DUR,KLOR-CON) 20 MEQ tablet Take 10 mEq by mouth every morning.     [provider]  tamsulosin (FLOMAX) 0.4 MG CAPS capsule Take 0.4 mg by mouth daily. 03/09/16   [provider]    Inpatient Medications: Scheduled Meds:  amLODipine  5 mg Oral Daily   atorvastatin  40 mg Oral q morning   finasteride  5 mg Oral q morning   heparin injection (subcutaneous)  5,000 Units Subcutaneous Q8H   hydrochlorothiazide  25 mg Oral q morning   insulin aspart  0-9 Units Subcutaneous TID WC   [START ON 07/17/2021] levothyroxine  100 mcg Oral QAC breakfast   lisinopril  40 mg Oral q morning   memantine  10 mg Oral BID   metFORMIN  500 mg Oral Daily   potassium chloride SA  10 mEq Oral q morning   sodium chloride flush  3 mL Intravenous Q12H   tamsulosin  0.4 mg Oral Daily   Continuous Infusions:   PRN Meds:   Allergies:    Allergies  Allergen Reactions   Spironolactone  Rash   Penicillins Rash    Has patient had a PCN reaction causing immediate rash, facial/tongue/throat swelling, SOB or lightheadedness with hypotension:no Has patient had a PCN reaction causing severe rash involving mucus membranes or skin necrosis: no Has patient had a PCN reaction that required hospitalization no Has patient had a PCN reaction occurring within the last 10 years: no If all of the above answers are "NO", then may proceed with Cephalosporin use.     Social History:   Social History   Socioeconomic History   Marital status: Married    Spouse name: [name]   Number of children: 2   Years of education: 12   Highest education level: Not on file  Occupational History    Comment: retired  Tobacco Use   Smoking status: Former    Years: 10.00    Types: Cigarettes    Quit date: 10/26/1966    Years since quitting: 54.7   Smokeless tobacco: Never  Vaping Use   Vaping Use: Never used  Substance and Sexual Activity   Alcohol use: No    Alcohol/week: 0.0 standard drinks   Drug use: No   Sexual activity: Not on file  Other Topics Concern   Not on file  Social History Narrative   06/09/21 lives with wife   Social Determinants of Health   Financial Resource Strain: Not on file  Food Insecurity: Not on file  Transportation Needs: Not on file  Physical Activity: Not on file  Stress: Not on file  Social Connections: Not on file  Intimate Partner Violence: Not on file    Family History:    Family History  Problem Relation Age of Onset   Tuberculosis Mother    CAD Father    Heart disease Sister      ROS:  Constitutional: see HPI  Eyes: Denied vision change or loss Ears/Nose/Mouth/Throat: Denied ear ache, sore throat, coughing, sinus pain Cardiovascular: see HPI  Respiratory: denied shortness of breath Gastrointestinal: Denied nausea, vomiting, abdominal pain, diarrhea Genital/Urinary: Denied dysuria, hematuria, urinary frequency/urgency Musculoskeletal:  Denied muscle ache, joint pain Skin: Denied rash, wound Neuro: see HPI  Psych: Denied history of depression/anxiety  Endocrine: history of diabetes     Physical Exam/Data:  Vitals:   07/16/21 1430 07/16/21 1445 07/16/21 1500 07/16/21 1530  BP: (!) 108/53 132/64 (!) 126/49 (!) 148/74  Pulse: (!) 39 (!) 43 (!) 42 (!) 42  Resp: 19 (!) 22 15 15   Temp:      TempSrc:      SpO2: 97% 98% 95% 97%  Weight:      Height:        Intake/Output Summary (Last 24 hours) at 07/16/2021 1546 Last data filed at 07/16/2021 1316 Gross per 24 hour  Intake 499.5 ml  Output --  Net 499.5 ml   Last 3 Weights 07/16/2021 06/09/2021 04/23/2021  Weight (lbs) 160 lb 167 lb 3.2 oz 189 lb 14.4 oz  Weight (kg) 72.576 kg 75.841 kg 86.138 kg     Body mass index is 25.82 kg/m.   Vitals:  Vitals:   07/16/21 1500 07/16/21 1530  BP: (!) 126/49 (!) 148/74  Pulse: (!) 42 (!) 42  Resp: 15 15  Temp:    SpO2: 95% 97%   General Appearance: In no apparent distress, sitting in bed HEENT: Normocephalic, atraumatic. EOMs intact. Sclera anicteric. Oropharynx is clear, tongue midline. Dentition is as expected for age. Neck: Supple, trachea midline, no JVDs, no carotid bruits. Cardiovascular: Irregularly irregular , normal S1-S2,  no murmur/rub/gallop Respiratory: Resting breathing unlabored, lungs sounds clear to auscultation bilaterally, no use of accessory muscles. On room air.  No wheezes, rales or rhonchi.   Gastrointestinal: Bowel sounds positive, abdomen soft, non-tender, non-distended.  Extremities: Able to move all extremities in bed without difficulty, no edema/cyanosis/clubbing Genitourinary: genital exam not performed Musculoskeletal: Normal muscle bulk and tone Skin: Intact, warm, dry. No rashes or petechiae noted in exposed areas.  Neurologic: Alert, oriented to person, place and time. Fluent speech, memory loss +, no focal muscle weakness, no gross focal neuro deficit Psychiatric: Normal affect. Mood is  appropriate.   EKG:  The EKG was personally reviewed and demonstrates:  A fib with slowed ventricular response 43 bpm, old RBBB   Telemetry:  Telemetry was personally reviewed and demonstrates:  A fib with ventricular rate of 35-45s   Relevant CV Studies:  Echo from 05/23/15:  - Left ventricle: The cavity size was mildly dilated. Wall    thickness was increased in a pattern of mild LVH. Systolic    function was normal. The estimated ejection fraction was in the    range of 55% to 60%. Wall motion was normal; there were no    regional wall motion abnormalities. Doppler parameters are    consistent with abnormal left ventricular relaxation (grade 1    diastolic dysfunction).  - Aortic valve: There was mild regurgitation.  - Aortic root: The aortic root was mildly dilated.  - Mitral valve: There was mild regurgitation.  - Left atrium: The atrium was moderately dilated.  - Pulmonary arteries: Systolic pressure was mildly increased.   Impressions:   - Normal LV function; mild LVH; grade 1 diastolic dysfunction; mild    AI and MR; moderate LAE; trace TR with mildly elevated pulmonary    pressure.   Cardiac cath on 06/16/13:    IMPRESSIONS:  1. Widely patent saphenous vein grafts to the PDA/RV branch, the first diagonal, and the first obtuse marginal. The continuation limb to the second obtuse marginal is totally occluded.   2. Widely patent LIMA to the distal LAD   3. Severe native vessel occlusive disease with ostial total occlusion of the right coronary, distal 99% occlusion of the left main, and  evidence of heavy-use coronary calcification in the left main LAD circumflex and right coronary. I suspect that the progression in symptoms are related to occlusion of the left main then now leave the proximal LAD territory and the distal circumflex territory dependent on retrograde flow via the saphenous vein graft to the diagonal.   4. The distal circumflex is collateralized from the  obtuse marginal graft.   5. Normal LV function     RECOMMENDATION:  Medical therapy.   Laboratory Data:  High Sensitivity Troponin:  No results for input(s): TROPONINIHS in the last 720 hours.   Chemistry Recent Labs  Lab 07/16/21 1050  NA 137  K 4.2  CL 105  CO2 24  GLUCOSE 115*  BUN 32*  CREATININE 1.12  CALCIUM 9.3  GFRNONAA >60  ANIONGAP 8    Recent Labs  Lab 07/16/21 1050  PROT 6.1*  ALBUMIN 2.9*  AST 16  ALT 12  ALKPHOS 80  BILITOT 0.5   Lipids No results for input(s): CHOL, TRIG, HDL, LABVLDL, LDLCALC, CHOLHDL in the last 168 hours.  Hematology Recent Labs  Lab 07/16/21 1050  WBC 7.7  RBC 3.85*  HGB 11.6*  HCT 34.8*  MCV 90.4  MCH 30.1  MCHC 33.3  RDW 14.4  PLT 222   Thyroid No results for input(s): TSH, FREET4 in the last 168 hours.  BNPNo results for input(s): BNP, PROBNP in the last 168 hours.  DDimer No results for input(s): DDIMER in the last 168 hours.   Radiology/Studies:  DG Chest Port 1 View  Result Date: 07/16/2021 CLINICAL DATA:  Dizziness. EXAM: PORTABLE CHEST 1 VIEW COMPARISON:  June 13, 2013. FINDINGS: Stable cardiomediastinal silhouette. Status post coronary bypass graft. No acute pulmonary disease is noted. Bony thorax is unremarkable. IMPRESSION: No active disease. Electronically Signed   By: Lupita Raider M.D.   On: 07/16/2021 11:36     Assessment and Plan:   Lightheadedness with near syncope Generalized weakness - consider non-cardiac etiology  for dizziness/weakness, such as CVA, B 12 deficiency, UTI,  etc, will defer further work up to IM  New onset of A fib with slowed ventricular response  - dizziness/weakness possibly related to new onset of A fib with slowed VR - rate is slow 30-40s at this time, metoprolol 12.5mg  XL discontinued, will allow time for metabolism  - Echo is pending, TSH pending  - CHADS2VASc is 5, agree with anticoagulation, may transition heparin gtt to Eliquis 5mg  BID at DC, will stop heparin  gtt now with anticipation of PPM placement - Will allow 24 hours BB wash out, consult EP tomorrow   CAD with hx of CABG x6 in 1998  - no angina symptoms, trop pending, EKG non-ischemic - continue statin, discussed with MD, ok to continue ASA 81mg  with initiation of Eliquis   HTN - BP is controlled, stopped metoprolol, continued on amlodipine 5mg  and HCTZ 25mg  daily and lisinopril 40mg  daily   HLD - continue statin   Type 2 DM Memory loss with suspected dementia  Hypothyroidism BPH  - managed per IM    Risk Assessment/Risk Scores:    CHA2DS2-VASc Score = 5  This indicates a 7.2% annual risk of stroke. The patient's score is based upon: CHF History: 0 HTN History: 1 Diabetes History: 1 Stroke History: 0 Vascular Disease History: 1 Age Score: 2 Gender Score: 0       For questions or updates, please contact [facility] Please consult [email or web address] for contact info under  SignedCyndi Bender, NP  07/16/2021 3:46 PM

## 2021-07-16 NOTE — H&P (Signed)
History and Physical    [name] [name] ZOX:[id number] DOB: May 08, 1941 DOA: 07/16/2021  PCP: Renford Dills, MD (Confirm with patient/family/NH records and if not entered, this has to be entered at [facility] point of entry) Patient coming from: Home  I have personally briefly reviewed patient's old medical records in [facility]  Chief Complaint: Feeling weak  HPI: [name] [name] is a 80 y.o. male with medical history significant of CAD with CABG x6 in 1998, HTN, HLD, IIDM, dementia, hypothyroidism, BPH, came with worsening of generalized weakness and near syncope's.  Patient has had refractory HTN on multiple BP medications including metoprolol 25 mg twice daily.  2 weeks ago, patient started to have increasing generalized weakness, episode of near syncope when patient experienced lightheadedness and blurry vision and had to sit down and lie down, he denied any chest pains, no shortness of breath associated with any of the episodes.  He went to see PCP on September 9, who told him " slow heart beat" and decrease metoprolol from 25 mg daily to 12.5 mg daily.  Despite, patient continued to experience frequent episodes of generalized weakness and lightheadedness.  Today, patient went to see PCP again, EKG showed bradycardia and new onset of A. fib.  ED Course: Blood pressure stable, heart rate ranges 36 to 45 bpm, EKG showed A. fib with bradycardia.  Review of Systems: As per HPI otherwise 14 point review of systems negative.    Past Medical History:  Diagnosis Date   Benign prostatic hypertrophy with lower urinary tract symptoms (LUTS)    Bilateral edema of lower extremity    IMPROVED W/ TED HOSE   CAD (coronary artery disease)    cardiologist-  dr Verdis Prime--  s/p  cabg x6 in 1998   Heart murmur    Hiatal hernia    History of kidney stones    Hydronephrosis, left    Hyperlipidemia    Hypertension    Hypothyroidism    Left ureteral stone    Memory difficulties    Nephrolithiasis     right side per ct  non-obstructive   PVD (peripheral vascular disease) (HCC)    RBBB (right bundle branch block)    S/P CABG x 6    1998   Sigmoid diverticulosis    Sinus bradycardia    Type 2 diabetes mellitus (HCC)     Past Surgical History:  Procedure Laterality Date   CATARACT EXTRACTION W/ INTRAOCULAR LENS  IMPLANT, BILATERAL  2011   CORONARY ARTERY BYPASS GRAFT  1998   CABG X 6 --   LIMA  to the LAD and  other undefined SVG's   CYSTOSCOPY WITH BIOPSY N/A 11/29/2015   Procedure: CYSTOSCOPY WITH BIOPSY AND FULGERATION;  Surgeon: Sebastian Ache, MD;  Location: [facility];  Service: Urology;  Laterality: N/A;   CYSTOSCOPY/RETROGRADE/URETEROSCOPY/STONE EXTRACTION WITH BASKET Left 11/29/2015   Procedure: CYSTOSCOPY/RETROGRADE/DIAGNOSTIC URETEROSCOPY/STONE EXTRACTION;  Surgeon: Sebastian Ache, MD;  Location: [facility];  Service: Urology;  Laterality: Left;   EXTRACORPOREAL SHOCK WAVE LITHOTRIPSY Left 08-12-2015   LEFT HEART CATHETERIZATION WITH CORONARY/GRAFT ANGIOGRAM  06/16/2013   Procedure: LEFT HEART CATHETERIZATION WITH Isabel Caprice;  Surgeon: Lesleigh Noe, MD;  Location: [facility] CATH LAB;  Service: Cardiovascular; Native vessel occlusive disease w/ ostial total occlusion RCA, dLM, & heavy calicification LM LAD CX RCA/  SVG to diagonal patent, SVG to marginal 1 patent & 2 occluded, LIMA to dLAD patent/ dCFX collateral from obtuse marginal graft/  normal  LVF   TRANSTHORACIC ECHOCARDIOGRAM  05-23-2015  dr Sherilyn Cooter smith   mild LVH,  grade 1 diastolic dysfunction,  ef 55-060%,  mild AV calcifacation without stenosis,  mild AR and MR,  moderate LAE,  trivial PR and TR,  mild increase pulmonary pressure     reports that he quit smoking about 54 years ago. His smoking use included cigarettes. He has never used smokeless tobacco. He reports that he does not drink alcohol and does not use drugs.  Allergies  Allergen Reactions   Spironolactone Rash    Penicillins Rash    Has patient had a PCN reaction causing immediate rash, facial/tongue/throat swelling, SOB or lightheadedness with hypotension:no Has patient had a PCN reaction causing severe rash involving mucus membranes or skin necrosis: no Has patient had a PCN reaction that required hospitalization no Has patient had a PCN reaction occurring within the last 10 years: no If all of the above answers are "NO", then may proceed with Cephalosporin use.     Family History  Problem Relation Age of Onset   Tuberculosis Mother    CAD Father    Heart disease Sister      Prior to Admission medications   Medication Sig Start Date End Date Taking? Authorizing Provider  amLODipine (NORVASC) 5 MG tablet Take 5 mg by mouth daily. 04/05/17   [provider]  aspirin EC 81 MG tablet Take 1 tablet (81 mg total) by mouth daily. 06/01/17   Lyn Records, MD  atorvastatin (LIPITOR) 80 MG tablet Take 40 mg by mouth every morning.     [provider]  Cyanocobalamin (B-12 IJ) Use as directed monthly.    [provider]  finasteride (PROSCAR) 5 MG tablet Take 5 mg by mouth every morning.     [provider]  Flaxseed, Linseed, (FLAX SEEDS PO) Take 1 capsule by mouth daily.    [provider]  hydrochlorothiazide (HYDRODIURIL) 25 MG tablet Take 25 mg by mouth every morning.     [provider]  levothyroxine (SYNTHROID, LEVOTHROID) 100 MCG tablet Take 100 mcg by mouth daily before breakfast. 05/03/17   [provider]  lisinopril (PRINIVIL,ZESTRIL) 40 MG tablet Take 40 mg by mouth every morning.     [provider]  memantine (NAMENDA) 10 MG tablet Take 1 tablet (10 mg total) by mouth 2 (two) times daily. 06/09/21   Penumalli, Glenford Bayley, MD  metFORMIN (GLUCOPHAGE) 500 MG tablet Take 500 mg by mouth daily.     [provider]  metoprolol succinate (TOPROL-XL) 25 MG 24 hr tablet Take 1 tablet (25 mg total) by mouth daily. 06/26/20    Lyn Records, MD  nitroGLYCERIN (NITROSTAT) 0.4 MG SL tablet DISSOLVE ONE TABLET UNDER THE TONGUE EVERY 5 MINUTES AS NEEDED FOR CHEST PAIN 06/29/17   Lyn Records, MD  potassium chloride SA (K-DUR,KLOR-CON) 20 MEQ tablet Take 10 mEq by mouth every morning.     [provider]  tamsulosin (FLOMAX) 0.4 MG CAPS capsule Take 0.4 mg by mouth daily. 03/09/16   [provider]    Physical Exam: Vitals:   07/16/21 1145 07/16/21 1215 07/16/21 1230 07/16/21 1245  BP: 127/65 (!) 153/66 (!) 151/55 (!) 132/59  Pulse: (!) 43 (!) 44 (!) 48 (!) 43  Resp: 19 18 (!) 25 12  Temp:      TempSrc:      SpO2: 97% 99% 97% 97%  Weight:      Height:  Constitutional: NAD, calm, comfortable Vitals:   07/16/21 1145 07/16/21 1215 07/16/21 1230 07/16/21 1245  BP: 127/65 (!) 153/66 (!) 151/55 (!) 132/59  Pulse: (!) 43 (!) 44 (!) 48 (!) 43  Resp: 19 18 (!) 25 12  Temp:      TempSrc:      SpO2: 97% 99% 97% 97%  Weight:      Height:       Eyes: PERRL, lids and conjunctivae normal ENMT: Mucous membranes are moist. Posterior pharynx clear of any exudate or lesions.Normal dentition.  Neck: normal, supple, no masses, no thyromegaly Respiratory: clear to auscultation bilaterally, no wheezing, no crackles. Normal respiratory effort. No accessory muscle use.  Cardiovascular: Irregular heart rate, soft murmur heart base. No extremity edema. 2+ pedal pulses. No carotid bruits.  Abdomen: no tenderness, no masses palpated. No hepatosplenomegaly. Bowel sounds positive.  Musculoskeletal: no clubbing / cyanosis. No joint deformity upper and lower extremities. Good ROM, no contractures. Normal muscle tone.  Skin: no rashes, lesions, ulcers. No induration Neurologic: CN 2-12 grossly intact. Sensation intact, DTR normal. Strength 5/5 in all 4.  Psychiatric: Normal judgment and insight. Alert and oriented x 3. Normal mood.     Labs on Admission: I have personally reviewed following labs and imaging  studies  CBC: Recent Labs  Lab 07/16/21 1050  WBC 7.7  HGB 11.6*  HCT 34.8*  MCV 90.4  PLT 222   Basic Metabolic Panel: Recent Labs  Lab 07/16/21 1050  NA 137  K 4.2  CL 105  CO2 24  GLUCOSE 115*  BUN 32*  CREATININE 1.12  CALCIUM 9.3   GFR: Estimated Creatinine Clearance: 46.7 mL/min (by C-G formula based on SCr of 1.12 mg/dL). Liver Function Tests: Recent Labs  Lab 07/16/21 1050  AST 16  ALT 12  ALKPHOS 80  BILITOT 0.5  PROT 6.1*  ALBUMIN 2.9*   No results for input(s): LIPASE, AMYLASE in the last 168 hours. No results for input(s): AMMONIA in the last 168 hours. Coagulation Profile: No results for input(s): INR, PROTIME in the last 168 hours. Cardiac Enzymes: No results for input(s): CKTOTAL, CKMB, CKMBINDEX, TROPONINI in the last 168 hours. BNP (last 3 results) No results for input(s): PROBNP in the last 8760 hours. HbA1C: No results for input(s): HGBA1C in the last 72 hours. CBG: No results for input(s): GLUCAP in the last 168 hours. Lipid Profile: No results for input(s): CHOL, HDL, LDLCALC, TRIG, CHOLHDL, LDLDIRECT in the last 72 hours. Thyroid Function Tests: No results for input(s): TSH, T4TOTAL, FREET4, T3FREE, THYROIDAB in the last 72 hours. Anemia Panel: No results for input(s): VITAMINB12, FOLATE, FERRITIN, TIBC, IRON, RETICCTPCT in the last 72 hours. Urine analysis:    Component Value Date/Time   COLORURINE AMBER (A) 11/30/2015 1558   APPEARANCEUR CLOUDY (A) 11/30/2015 1558   LABSPEC 1.016 11/30/2015 1558   PHURINE 6.5 11/30/2015 1558   GLUCOSEU NEGATIVE 11/30/2015 1558   HGBUR LARGE (A) 11/30/2015 1558   BILIRUBINUR NEGATIVE 11/30/2015 1558   KETONESUR NEGATIVE 11/30/2015 1558   PROTEINUR 100 (A) 11/30/2015 1558   UROBILINOGEN 1.0 05/10/2015 0625   NITRITE NEGATIVE 11/30/2015 1558   LEUKOCYTESUR SMALL (A) 11/30/2015 1558    Radiological Exams on Admission: DG Chest Port 1 View  Result Date: 07/16/2021 CLINICAL DATA:   Dizziness. EXAM: PORTABLE CHEST 1 VIEW COMPARISON:  June 13, 2013. FINDINGS: Stable cardiomediastinal silhouette. Status post coronary bypass graft. No acute pulmonary disease is noted. Bony thorax is unremarkable. IMPRESSION: No active disease. Electronically Signed  By: Lupita Raider M.D.   On: 07/16/2021 11:36    EKG: Independently reviewed.  A. fib with bradycardia,  Assessment/Plan Active Problems:   Near syncope   A-fib (HCC)   Bradycardia  (please populate well all problems here in Problem List. (For example, if patient is on BP meds at home and you resume or decide to hold them, it is a problem that needs to be her. Same for CAD, COPD, HLD and so on)  Syncope and generalized weakness -Secondary to bradycardia -Blood pressure stable, discontinue beta-blocker and monitor.  Cardiology consulted. -Echo -Orthostatic vital signs twice daily, PT evaluation -Check TSH  A. fib with bradycardia -Appears to have chronic bradycardia, HR was 46 last year.  Cardiology has been cutting down patient's beta-blocker since last year.  Given worsening symptoms of frequent near syncope's, discontinue beta-blocker. -CHADS2=2, heparin drip, no major history of GI bleed, discharged on p.o. anticoagulation.  CAD with CABG x6 -No acute issue, check troponin levels.  IIDM -Metformin -Sliding scale  HLD -Continue statin  DVT prophylaxis: Heparin drip Code Status: Full Code Family Communication: Wife at bedside Disposition Plan: Expect less than 2 midnight hospital stay Consults called: Cardiology Admission status: Tele obs   Emeline General MD [facility] Pager [phone]  07/16/2021, 12:57 PM

## 2021-07-16 NOTE — ED Notes (Signed)
Contacted pharmacy about medication at this time

## 2021-07-16 NOTE — ED Triage Notes (Signed)
c/o dizziness x10 days, saw PCP x2, NSR EKG at PCP, but "soft pressures" so PCP halved BP med (beta blocker? Family unsure). PCP again today and EKG change showed irregular bradycardia vs afib. Baseline GCS 14.  BP 146/68 98% on RA, HR in the 40s.

## 2021-07-16 NOTE — ED Notes (Signed)
Resting in stretcher, wife at bedside, no distress noted, call light in reach.

## 2021-07-16 NOTE — ED Notes (Signed)
Received verbal report from Gloria G RN at this time 

## 2021-07-17 ENCOUNTER — Observation Stay (HOSPITAL_COMMUNITY): Payer: PPO

## 2021-07-17 ENCOUNTER — Other Ambulatory Visit (HOSPITAL_COMMUNITY): Payer: Self-pay

## 2021-07-17 ENCOUNTER — Encounter (HOSPITAL_COMMUNITY)
Admission: EM | Disposition: A | Discharge status: Home / Self Care | Payer: Self-pay | Source: Home / Self Care | Attending: Emergency Medicine

## 2021-07-17 DIAGNOSIS — I4891 Unspecified atrial fibrillation: Secondary | ICD-10-CM | POA: Diagnosis not present

## 2021-07-17 DIAGNOSIS — Z79899 Other long term (current) drug therapy: Secondary | ICD-10-CM | POA: Diagnosis not present

## 2021-07-17 DIAGNOSIS — I2510 Atherosclerotic heart disease of native coronary artery without angina pectoris: Secondary | ICD-10-CM | POA: Diagnosis not present

## 2021-07-17 DIAGNOSIS — R001 Bradycardia, unspecified: Secondary | ICD-10-CM | POA: Diagnosis not present

## 2021-07-17 DIAGNOSIS — R55 Syncope and collapse: Secondary | ICD-10-CM | POA: Diagnosis not present

## 2021-07-17 DIAGNOSIS — Z951 Presence of aortocoronary bypass graft: Secondary | ICD-10-CM | POA: Diagnosis not present

## 2021-07-17 DIAGNOSIS — Z87891 Personal history of nicotine dependence: Secondary | ICD-10-CM | POA: Diagnosis not present

## 2021-07-17 DIAGNOSIS — E039 Hypothyroidism, unspecified: Secondary | ICD-10-CM | POA: Diagnosis not present

## 2021-07-17 DIAGNOSIS — Z7984 Long term (current) use of oral hypoglycemic drugs: Secondary | ICD-10-CM | POA: Diagnosis not present

## 2021-07-17 DIAGNOSIS — I441 Atrioventricular block, second degree: Secondary | ICD-10-CM | POA: Diagnosis not present

## 2021-07-17 DIAGNOSIS — I4430 Unspecified atrioventricular block: Secondary | ICD-10-CM | POA: Diagnosis not present

## 2021-07-17 DIAGNOSIS — I10 Essential (primary) hypertension: Secondary | ICD-10-CM | POA: Diagnosis not present

## 2021-07-17 DIAGNOSIS — I517 Cardiomegaly: Secondary | ICD-10-CM | POA: Diagnosis not present

## 2021-07-17 DIAGNOSIS — E119 Type 2 diabetes mellitus without complications: Secondary | ICD-10-CM | POA: Diagnosis not present

## 2021-07-17 DIAGNOSIS — Z20822 Contact with and (suspected) exposure to covid-19: Secondary | ICD-10-CM | POA: Diagnosis not present

## 2021-07-17 DIAGNOSIS — F0390 Unspecified dementia without behavioral disturbance: Secondary | ICD-10-CM | POA: Diagnosis not present

## 2021-07-17 HISTORY — PX: PACEMAKER IMPLANT: EP1218

## 2021-07-17 LAB — CBG MONITORING, ED
Glucose-Capillary: 126 mg/dL — ABNORMAL HIGH (ref 70–99)
Glucose-Capillary: 111 mg/dL — ABNORMAL HIGH (ref 70–99)

## 2021-07-17 LAB — CBC
HCT: 33.9 % — ABNORMAL LOW (ref 39.0–52.0)
Hemoglobin: 11.1 g/dL — ABNORMAL LOW (ref 13.0–17.0)
MCH: 29.8 pg (ref 26.0–34.0)
MCHC: 32.7 g/dL (ref 30.0–36.0)
MCV: 90.9 fL (ref 80.0–100.0)
Platelets: 218 10*3/uL (ref 150–400)
RBC: 3.73 MIL/uL — ABNORMAL LOW (ref 4.22–5.81)
RDW: 14.6 % (ref 11.5–15.5)
WBC: 8.5 10*3/uL (ref 4.0–10.5)
nRBC: 0 % (ref 0.0–0.2)

## 2021-07-17 LAB — BASIC METABOLIC PANEL
Anion gap: 8 (ref 5–15)
BUN: 18 mg/dL (ref 8–23)
CO2: 24 mmol/L (ref 22–32)
Calcium: 9 mg/dL (ref 8.9–10.3)
Chloride: 107 mmol/L (ref 98–111)
Creatinine, Ser: 0.89 mg/dL (ref 0.61–1.24)
GFR, Estimated: >60 mL/min (ref >60)
Glucose, Bld: 113 mg/dL — ABNORMAL HIGH (ref 70–99)
Potassium: 4 mmol/L (ref 3.5–5.1)
Sodium: 139 mmol/L (ref 135–145)

## 2021-07-17 LAB — HEMOGLOBIN A1C
Hgb A1c MFr Bld: 6.2 % — ABNORMAL HIGH (ref 4.8–5.6)
Mean Plasma Glucose: 131 mg/dL

## 2021-07-17 LAB — ECHOCARDIOGRAM COMPLETE
AR max vel: 2.2 cm2
AV Area VTI: 2.19 cm2
AV Area mean vel: 2.01 cm2
AV Mean grad: 7 mmHg
AV Peak grad: 14.7 mmHg
Ao pk vel: 1.92 m/s
Area-P 1/2: 3.19 cm2
Height: 66 in
P 1/2 time: 794 msec
S' Lateral: 3.5 cm
Single Plane A4C EF: 57.8 %
Weight: 2560 oz

## 2021-07-17 LAB — GLUCOSE, CAPILLARY: Glucose-Capillary: 147 mg/dL — ABNORMAL HIGH (ref 70–99)

## 2021-07-17 SURGERY — PACEMAKER IMPLANT

## 2021-07-17 MED ORDER — ACETAMINOPHEN 325 MG PO TABS
325.0000 mg | ORAL_TABLET | ORAL | Status: DC | PRN
  Filled 2021-07-17: qty 2

## 2021-07-17 MED ORDER — HEPARIN (PORCINE) IN NACL 1000-0.9 UT/500ML-% IV SOLN
INTRAVENOUS | Status: DC | PRN
  Administered 2021-07-17: 500 mL

## 2021-07-17 MED ORDER — SODIUM CHLORIDE 0.9 % IV SOLN
80.0000 mg | INTRAVENOUS | Status: AC
  Administered 2021-07-17: 80 mg
  Filled 2021-07-17: qty 2

## 2021-07-17 MED ORDER — SODIUM CHLORIDE 0.9% FLUSH: 3.0000 mL | INTRAVENOUS | Status: DC | PRN

## 2021-07-17 MED ORDER — LIDOCAINE HCL (PF) 1 % IJ SOLN
INTRAMUSCULAR | Status: DC | PRN
  Administered 2021-07-17: 60 mL

## 2021-07-17 MED ORDER — VANCOMYCIN HCL IN DEXTROSE 1-5 GM/200ML-% IV SOLN
INTRAVENOUS | Status: AC
  Filled 2021-07-17: qty 200

## 2021-07-17 MED ORDER — ACETAMINOPHEN 325 MG PO TABS: 325.0000 mg | ORAL_TABLET | ORAL | 0 refills | Status: AC | PRN

## 2021-07-17 MED ORDER — APIXABAN 5 MG PO TABS
5.0000 mg | ORAL_TABLET | Freq: Two times a day (BID) | ORAL | 0 refills | Status: AC
  Filled 2021-07-17: qty 60, 30d supply, fill #0

## 2021-07-17 MED ORDER — CHLORHEXIDINE GLUCONATE 4 % EX LIQD: 60.0000 mL | Freq: Once | CUTANEOUS | Status: DC

## 2021-07-17 MED ORDER — SODIUM CHLORIDE 0.9 % IV SOLN: 250.0000 mL | INTRAVENOUS | Status: DC

## 2021-07-17 MED ORDER — HEPARIN (PORCINE) IN NACL 1000-0.9 UT/500ML-% IV SOLN
INTRAVENOUS | Status: AC
  Filled 2021-07-17: qty 500

## 2021-07-17 MED ORDER — SODIUM CHLORIDE 0.9 % IV SOLN: INTRAVENOUS | Status: DC

## 2021-07-17 MED ORDER — APIXABAN 5 MG PO TABS: 5.0000 mg | ORAL_TABLET | Freq: Two times a day (BID) | ORAL | 5 refills | Status: DC

## 2021-07-17 MED ORDER — SODIUM CHLORIDE 0.9 % IV SOLN
INTRAVENOUS | Status: AC
  Filled 2021-07-17: qty 2

## 2021-07-17 MED ORDER — ONDANSETRON HCL 4 MG/2ML IJ SOLN: 4.0000 mg | Freq: Four times a day (QID) | INTRAMUSCULAR | Status: DC | PRN

## 2021-07-17 MED ORDER — VANCOMYCIN HCL IN DEXTROSE 1-5 GM/200ML-% IV SOLN
1000.0000 mg | INTRAVENOUS | Status: AC
  Administered 2021-07-17: 1000 mg via INTRAVENOUS
  Filled 2021-07-17: qty 200

## 2021-07-17 MED ORDER — SODIUM CHLORIDE 0.9% FLUSH: 3.0000 mL | Freq: Two times a day (BID) | INTRAVENOUS | Status: DC

## 2021-07-17 MED ORDER — LIDOCAINE HCL 1 % IJ SOLN
INTRAMUSCULAR | Status: AC
  Filled 2021-07-17: qty 60

## 2021-07-17 MED ORDER — VANCOMYCIN HCL 500 MG/100ML IV SOLN
500.0000 mg | Freq: Once | INTRAVENOUS | Status: AC
  Administered 2021-07-17: 500 mg via INTRAVENOUS
  Filled 2021-07-17: qty 100

## 2021-07-17 SURGICAL SUPPLY — 12 items

## 2021-07-17 NOTE — Consult Note (Addendum)
Cardiology Consultation:   Patient ID: [name] [name] MRN: [id number]; DOB: Apr 06, 1941  Admit date: 07/16/2021 Date of Consult: 07/17/2021  PCP:  Seward Carol, MD   [facility] Providers Cardiologist:  Sinclair Grooms, MD   Patient Profile:   [name] [name] is a 80 y.o. male with a hx of CAD (CABG 1998), HTN, HLD, hypothyroidism, RBBB, dementia who is being seen 07/17/2021 for the evaluation of symptomatic bradycardia at the request of Dr. Marisue Ivan.  History of Present Illness:   Mr. [name] [name] refers to his wife (on the phone for much of the conversation), has of late been unusually weak, tired with recurrent dizzy spells, sounds like in d/w with their PMD or our office recommended to reduce the dose of his Toprol in 1/2 to 12.$RemoveB'5mg'oZNXmAYK$  the day prior to coming in, though with ongoing symptoms his PMD recommended he be evaluated at the ER. No reports of CP, SOB He has not had syncope, but there is mention in notes and by his wife of near syncope.  In the ER he was found in AFib new for him and bradycardic with rates 30's, stable BP. He was admitted and cardiology consulted, his home Toprol held for monitoring of HR recovery. Labs largely unremarkable HS Trop 70 without symptoms of angina and not suspect to have ACS/ischemic driven bradycardia, with suspect need for pacing, a/c not started.  LABS K+ 4.2 > 4.0 BUN/Creat 18/0.89 HS 70 WBC 8.5 H/H 11/33 Plts 218  TTE with LVEF 50-55%, mod dilated LA, no significant VHD   Past Medical History:  Diagnosis Date   Benign prostatic hypertrophy with lower urinary tract symptoms (LUTS)    Bilateral edema of lower extremity    IMPROVED W/ TED HOSE   CAD (coronary artery disease)    cardiologist-  [name]--  s/p  cabg x6 in 1998   Heart murmur    Hiatal hernia    History of kidney stones    Hydronephrosis, left    Hyperlipidemia    Hypertension    Hypothyroidism    Left ureteral stone    Memory difficulties    Nephrolithiasis     right side per ct  non-obstructive   PVD (peripheral vascular disease) (HCC)    RBBB (right bundle branch block)    S/P CABG x 6    1998   Sigmoid diverticulosis    Sinus bradycardia    Type 2 diabetes mellitus ([place])     Past Surgical History:  Procedure Laterality Date   CATARACT EXTRACTION W/ INTRAOCULAR LENS  IMPLANT, BILATERAL  2011   CORONARY ARTERY BYPASS GRAFT  1998   CABG X 6 --   LIMA  to the LAD and  other undefined SVG's   CYSTOSCOPY WITH BIOPSY N/A 11/29/2015   Procedure: CYSTOSCOPY WITH BIOPSY AND FULGERATION;  Surgeon: [name], MD;  Location: [facility];  Service: Urology;  Laterality: N/A;   CYSTOSCOPY/RETROGRADE/URETEROSCOPY/STONE EXTRACTION WITH BASKET Left 11/29/2015   Procedure: CYSTOSCOPY/RETROGRADE/DIAGNOSTIC URETEROSCOPY/STONE EXTRACTION;  Surgeon: [name], MD;  Location: [facility];  Service: Urology;  Laterality: Left;   EXTRACORPOREAL SHOCK WAVE LITHOTRIPSY Left 08-12-2015   LEFT HEART CATHETERIZATION WITH CORONARY/GRAFT ANGIOGRAM  06/16/2013   Procedure: LEFT HEART CATHETERIZATION WITH [name];  Surgeon: Sinclair Grooms, MD;  Location: [facility] CATH LAB;  Service: Cardiovascular; Native vessel occlusive disease w/ ostial total occlusion RCA, dLM, & heavy calicification LM LAD CX RCA/  SVG to diagonal patent, SVG to marginal  1 patent & 2 occluded, LIMA to dLAD patent/ dCFX collateral from obtuse marginal graft/  normal LVF   TRANSTHORACIC ECHOCARDIOGRAM  05-23-2015  [name]   mild LVH,  grade 1 diastolic dysfunction,  ef 55-060%,  mild AV calcifacation without stenosis,  mild AR and MR,  moderate LAE,  trivial PR and TR,  mild increase pulmonary pressure     Home Medications:  Prior to Admission medications   Medication Sig Start Date End Date Taking? Authorizing Provider  amLODipine (NORVASC) 5 MG tablet Take 5 mg by mouth daily. 04/05/17  Yes [provider]  aspirin EC 81 MG tablet Take 1  tablet (81 mg total) by mouth daily. 06/01/17  Yes Belva Crome, MD  atorvastatin (LIPITOR) 80 MG tablet Take 40 mg by mouth every morning.    Yes [provider]  Cyanocobalamin 1000 MCG/ML KIT Inject 1,000 mcg as directed every 30 (thirty) days.   Yes [provider]  finasteride (PROSCAR) 5 MG tablet Take 5 mg by mouth every morning.    Yes [provider]  Flaxseed, Linseed, (FLAX SEEDS PO) Take 1 capsule by mouth daily.   Yes [provider]  hydrochlorothiazide (HYDRODIURIL) 25 MG tablet Take 25 mg by mouth every morning.    Yes [provider]  levothyroxine (SYNTHROID, LEVOTHROID) 100 MCG tablet Take 100 mcg by mouth daily before breakfast. 05/03/17  Yes [provider]  lisinopril (PRINIVIL,ZESTRIL) 40 MG tablet Take 40 mg by mouth every morning.    Yes [provider]  memantine (NAMENDA) 10 MG tablet Take 1 tablet (10 mg total) by mouth 2 (two) times daily. 06/09/21  Yes Penumalli, Earlean Polka, MD  metFORMIN (GLUCOPHAGE) 500 MG tablet Take 500 mg by mouth daily.    Yes [provider]  metoprolol succinate (TOPROL-XL) 25 MG 24 hr tablet Take 1 tablet (25 mg total) by mouth daily. 06/26/20  Yes Belva Crome, MD  nitroGLYCERIN (NITROSTAT) 0.4 MG SL tablet DISSOLVE ONE TABLET UNDER THE TONGUE EVERY 5 MINUTES AS NEEDED FOR CHEST PAIN Patient taking differently: Place 0.4 mg under the tongue every 5 (five) minutes as needed. 06/29/17  Yes Belva Crome, MD  potassium chloride SA (K-DUR,KLOR-CON) 20 MEQ tablet Take 10 mEq by mouth every morning.    Yes [provider]  tamsulosin (FLOMAX) 0.4 MG CAPS capsule Take 0.4 mg by mouth daily. 03/09/16  Yes [provider]    Inpatient Medications: Scheduled Meds:  amLODipine  5 mg Oral Daily   atorvastatin  40 mg Oral q morning   chlorhexidine  60 mL Topical Once   chlorhexidine  60 mL Topical Once   finasteride  5 mg Oral q morning   gentamicin irrigation  80 mg  Irrigation On Call   hydrochlorothiazide  25 mg Oral q morning   insulin aspart  0-9 Units Subcutaneous TID WC   levothyroxine  100 mcg Oral Q0600   lisinopril  40 mg Oral q morning   memantine  10 mg Oral BID   metFORMIN  500 mg Oral Q breakfast   potassium chloride SA  10 mEq Oral q morning   sodium chloride flush  3 mL Intravenous Q12H   sodium chloride flush  3 mL Intravenous Q12H   tamsulosin  0.4 mg Oral Daily   Continuous Infusions:  sodium chloride     sodium chloride     vancomycin     PRN Meds: sodium chloride flush  Allergies:  Allergies  Allergen Reactions   Spironolactone Rash   Penicillins Rash    Has patient had a PCN reaction causing immediate rash, facial/tongue/throat swelling, SOB or lightheadedness with hypotension:no Has patient had a PCN reaction causing severe rash involving mucus membranes or skin necrosis: no Has patient had a PCN reaction that required hospitalization no Has patient had a PCN reaction occurring within the last 10 years: no If all of the above answers are "NO", then may proceed with Cephalosporin use.     Social History:   Social History   Socioeconomic History   Marital status: Married    Spouse name: [name]   Number of children: 2   Years of education: 12   Highest education level: Not on file  Occupational History    Comment: retired  Tobacco Use   Smoking status: Former    Years: 10.00    Types: Cigarettes    Quit date: 10/26/1966    Years since quitting: 54.7   Smokeless tobacco: Never  Vaping Use   Vaping Use: Never used  Substance and Sexual Activity   Alcohol use: No    Alcohol/week: 0.0 standard drinks   Drug use: No   Sexual activity: Not on file  Other Topics Concern   Not on file  Social History Narrative   06/09/21 lives with wife   Social Determinants of Health   Financial Resource Strain: Not on file  Food Insecurity: Not on file  Transportation Needs: Not on file  Physical Activity: Not on  file  Stress: Not on file  Social Connections: Not on file  Intimate Partner Violence: Not on file    Family History:   Family History  Problem Relation Age of Onset   Tuberculosis Mother    CAD Father    Heart disease Sister      ROS:  Please see the history of present illness.  All other ROS reviewed and negative.     Physical Exam/Data:   Vitals:   07/17/21 0315 07/17/21 0445 07/17/21 0500 07/17/21 0620  BP: 112/78 (!) 144/57 100/69 132/62  Pulse: (!) 57 (!) 37 (!) 57 (!) 56  Resp: _0 Temp:      TempSrc:      SpO2: 95% 97% 92% 95%  Weight:      Height:        Intake/Output Summary (Last 24 hours) at 07/17/2021 0957 Last data filed at 07/16/2021 1316 Gross per 24 hour  Intake 499.5 ml  Output --  Net 499.5 ml   Last 3 Weights 07/16/2021 06/09/2021 04/23/2021  Weight (lbs) 160 lb 167 lb 3.2 oz 189 lb 14.4 oz  Weight (kg) 72.576 kg 75.841 kg 86.138 kg     Body mass index is 25.82 kg/m.  General:  Well nourished, well developed, in no acute distress HEENT: normal Neck: no JVD Vascular: No carotid bruits; Distal pulses 2+ bilaterally Cardiac:  RRR; no murmurs, gallops or rubs Lungs:  CTA b/l, no wheezing, rhonchi or rales  Abd: soft, nontender, no hepatomegaly  Ext: no edema Musculoskeletal:  No deformities Skin: warm and dry  Neuro:  CNs 2-12 intact, no focal abnormalities noted Psych:  Normal affect   EKG:  The EKG was personally reviewed and demonstrates:   AFib 43bpm, RBBB 2:1 AVblock, 39bpm, RBBB, leftward axis   OLD 06/26/20: SB 46bpm, RBBB  Telemetry:  Telemetry was personally reviewed and demonstrates:   He is currently in 2:1 AVBlock high 30's  Relevant CV  Studies:  07/16/21: TTE IMPRESSIONS   1. Left ventricular ejection fraction, by estimation, is 50 to 55%. The  left ventricle has low normal function. The left ventricle has no regional  wall motion abnormalities. There is mild left ventricular hypertrophy.  Left ventricular  diastolic  parameters are indeterminate.   2. Right ventricular systolic function is normal. The right ventricular  size is normal. Tricuspid regurgitation signal is inadequate for assessing  PA pressure.   3. Left atrial size was moderately dilated.   4. Right atrial size was mildly dilated.   5. The mitral valve is grossly normal. Mild mitral valve regurgitation.  No evidence of mitral stenosis.   6. The aortic valve is grossly normal. There is moderate calcification of  the aortic valve. Aortic valve regurgitation is trivial. Mild aortic valve  sclerosis is present, with no evidence of aortic valve stenosis.   7. Aortic dilatation noted. There is mild dilatation of the aortic root,  measuring 41 mm.   Laboratory Data:  High Sensitivity Troponin:   Recent Labs  Lab 07/16/21 1335  TROPONINIHS 70*     Chemistry Recent Labs  Lab 07/16/21 1050 07/17/21 0450  NA 137 139  K 4.2 4.0  CL 105 107  CO2 24 24  GLUCOSE 115* 113*  BUN 32* 18  CREATININE 1.12 0.89  CALCIUM 9.3 9.0  GFRNONAA >60 >60  ANIONGAP 8 8    Recent Labs  Lab 07/16/21 1050  PROT 6.1*  ALBUMIN 2.9*  AST 16  ALT 12  ALKPHOS 80  BILITOT 0.5   Lipids No results for input(s): CHOL, TRIG, HDL, LABVLDL, LDLCALC, CHOLHDL in the last 168 hours.  Hematology Recent Labs  Lab 07/16/21 1050 07/17/21 0450  WBC 7.7 8.5  RBC 3.85* 3.73*  HGB 11.6* 11.1*  HCT 34.8* 33.9*  MCV 90.4 90.9  MCH 30.1 29.8  MCHC 33.3 32.7  RDW 14.4 14.6  PLT 222 218   Thyroid No results for input(s): TSH, FREET4 in the last 168 hours.  BNPNo results for input(s): BNP, PROBNP in the last 168 hours.  DDimer No results for input(s): DDIMER in the last 168 hours.   Radiology/Studies:  DG Chest Port 1 View Result Date: 07/16/2021 CLINICAL DATA:  Dizziness. EXAM: PORTABLE CHEST 1 VIEW COMPARISON:  June 13, 2013. FINDINGS: Stable cardiomediastinal silhouette. Status post coronary bypass graft. No acute pulmonary disease is  noted. Bony thorax is unremarkable. IMPRESSION: No active disease. Electronically Signed   By: Marijo Conception M.D.   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 Lovena Le has seen and examined the patient this morning, discussed rational for PPM implant, potential risks and benefits with the patient/daughter at bedside and wife who is on the phone They are agreeable to proceed  CHA2DS2Vasc is 5, will need a/c, plan for post pacing, pending pocket stability He is back in SR   Risk Assessment/Risk Scores:    For questions or updates, please contact [place] Please consult [email or web address] for contact info under   Signed, Brien Few  Olevia Bowens  07/17/2021 9:57 AM  EP Attending  Patient seen and examined. Agree with the findings as noted above. The patient presents with symptomatic bradycardia in atrial fib. He has reverted to NSR and has developed 2:1 AV block with RBBB as well. He is symptomatic and I have discussed the indications/risks/benefits/goals/expectations of PPM insertion with the patient and his wife and daughter and they wish to proceed.  Carleene Overlie Faiza Bansal,MD

## 2021-07-17 NOTE — Progress Notes (Addendum)
Wound care and activity restrictions were discussed with the patient and his wife at bedside.  He has new Afib, and will stop his ASA and start Eliquis 5mg  BID, to start on Tuesday 07/22/21.written in his AVS  Planned for discharge once device check and CXR are completed and cleared   1st fill of his Eliquis will be filled via TOC  07/24/21, PA-C  EP Attending  Patient seen anexamined. Agree with above. He is stable for DC home. Usual followup.   Francis Dowse Lily Kernen,MD

## 2021-07-17 NOTE — Progress Notes (Signed)
PT Cancellation Note  Patient Details Name: [name] [name] MRN: [id number] DOB: 1941/06/02   Cancelled Treatment:    Reason Eval/Treat Not Completed: Patient at procedure or test/unavailable.  Gone to have pacemaker inserted, will try again at another time.   Ivar Drape 07/17/2021, 10:39 AM  Samul Dada, PT MS Acute Rehab Dept. Number: [facility] [id number] and [facility] [phone]

## 2021-07-17 NOTE — Discharge Instructions (Signed)
    Supplemental Discharge Instructions for  Pacemaker/Defibrillator Patients   Activity No heavy lifting or vigorous activity with your left/right arm for 6 to 8 weeks.  Do not raise your left/right arm above your head for one week.  Gradually raise your affected arm as drawn below.              07/22/21                   07/23/21                   07/24/21                 07/25/21 __  NO DRIVING for  1 week   ; you may begin driving on  9/79/89   .  WOUND CARE Keep the wound area clean and dry.  Do not get this area wet , no showers for one week; you may shower on  07/25/21   . The tape/steri-strips on your wound will fall off; do not pull them off.  No bandage is needed on the site.  DO  NOT apply any creams, oils, or ointments to the wound area. If you notice any drainage or discharge from the wound, any swelling or bruising at the site, or you develop a fever > 101? F after you are discharged home, call the office at once.  Special Instructions You are still able to use cellular telephones; use the ear opposite the side where you have your pacemaker/defibrillator.  Avoid carrying your cellular phone near your device. When traveling through airports, show security personnel your identification card to avoid being screened in the metal detectors.  Ask the security personnel to use the hand wand. Avoid arc welding equipment, MRI testing (magnetic resonance imaging), TENS units (transcutaneous nerve stimulators).  Call the office for questions about other devices. Avoid electrical appliances that are in poor condition or are not properly grounded. Microwave ovens are safe to be near or to operate.

## 2021-07-17 NOTE — Telephone Encounter (Signed)
Pt currently hospitalized and receiving a Pacemaker

## 2021-07-17 NOTE — ED Notes (Signed)
Doctor in seeing pt unable to do Orthostatic

## 2021-07-17 NOTE — Progress Notes (Addendum)
Progress Note  Patient Name: [name] [name] Date of Encounter: 07/17/2021  [facility] Cardiologist: Lesleigh Noe, MD   Subjective   Patient states he is feeling well today, denied any chest pain, heart palpitation, SOB. His wife is at bedside, states he was able to mow the grass but had to stop multiple times due to feeling weak. He states he is not dizzy at the moment, has not been out of bed since admission.   Inpatient Medications    Scheduled Meds:  amLODipine  5 mg Oral Daily   atorvastatin  40 mg Oral q morning   finasteride  5 mg Oral q morning   hydrochlorothiazide  25 mg Oral q morning   insulin aspart  0-9 Units Subcutaneous TID WC   levothyroxine  100 mcg Oral Q0600   lisinopril  40 mg Oral q morning   memantine  10 mg Oral BID   metFORMIN  500 mg Oral Q breakfast   potassium chloride SA  10 mEq Oral q morning   sodium chloride flush  3 mL Intravenous Q12H   tamsulosin  0.4 mg Oral Daily   Continuous Infusions:  PRN Meds:    Vital Signs    Vitals:   07/17/21 0315 07/17/21 0445 07/17/21 0500 07/17/21 0620  BP: 112/78 (!) 144/57 100/69 132/62  Pulse: (!) 57 (!) 37 (!) 57 (!) 56  Resp: 16 17 16 18   Temp:      TempSrc:      SpO2: 95% 97% 92% 95%  Weight:      Height:        Intake/Output Summary (Last 24 hours) at 07/17/2021 0923 Last data filed at 07/16/2021 1316 Gross per 24 hour  Intake 499.5 ml  Output --  Net 499.5 ml   Last 3 Weights 07/16/2021 06/09/2021 04/23/2021  Weight (lbs) 160 lb 167 lb 3.2 oz 189 lb 14.4 oz  Weight (kg) 72.576 kg 75.841 kg 86.138 kg      Telemetry    Sinus bradycardia with rate of 50-60s this morning, 2:1 AVB, converted from A fib, early morning bradycardia 30s noted  - Personally Reviewed  ECG    Repeat EKG ordered this AM - Personally Reviewed  Physical Exam   GEN: No acute distress.   Neck: No JVD Cardiac: RRR, systolic murmur grade II R/L USB Respiratory: Clear to auscultation bilaterally. On room  air.  GI: Soft, nontender, non-distended  MS: Trace BLE edema; No deformity. Neuro:  Alert and oriented to person/place/time, +memory loss, follow commands appropriately  Psych: Normal affect   Labs    High Sensitivity Troponin:   Recent Labs  Lab 07/16/21 1335  TROPONINIHS 70*     Chemistry Recent Labs  Lab 07/16/21 1050 07/17/21 0450  NA 137 139  K 4.2 4.0  CL 105 107  CO2 24 24  GLUCOSE 115* 113*  BUN 32* 18  CREATININE 1.12 0.89  CALCIUM 9.3 9.0  PROT 6.1*  --   ALBUMIN 2.9*  --   AST 16  --   ALT 12  --   ALKPHOS 80  --   BILITOT 0.5  --   GFRNONAA >60 >60  ANIONGAP 8 8    Lipids No results for input(s): CHOL, TRIG, HDL, LABVLDL, LDLCALC, CHOLHDL in the last 168 hours.  Hematology Recent Labs  Lab 07/16/21 1050 07/17/21 0450  WBC 7.7 8.5  RBC 3.85* 3.73*  HGB 11.6* 11.1*  HCT 34.8* 33.9*  MCV 90.4 90.9  MCH 30.1 29.8  MCHC 33.3 32.7  RDW 14.4 14.6  PLT 222 218   Thyroid No results for input(s): TSH, FREET4 in the last 168 hours.  BNPNo results for input(s): BNP, PROBNP in the last 168 hours.  DDimer No results for input(s): DDIMER in the last 168 hours.   Radiology    DG Chest Port 1 View  Result Date: 07/16/2021 CLINICAL DATA:  Dizziness. EXAM: PORTABLE CHEST 1 VIEW COMPARISON:  June 13, 2013. FINDINGS: Stable cardiomediastinal silhouette. Status post coronary bypass graft. No acute pulmonary disease is noted. Bony thorax is unremarkable. IMPRESSION: No active disease. Electronically Signed   By: Lupita Raider M.D.   On: 07/16/2021 11:36   ECHOCARDIOGRAM COMPLETE  Result Date: 07/17/2021    ECHOCARDIOGRAM REPORT   Patient Name:   [name] [name] Date of Exam: 07/16/2021 Medical Rec #:  [id number]      Height:       66.0 in Accession #:    [id number]     Weight:       160.0 lb Date of Birth:  02-08-1941       BSA:          1.819 m Patient Age:    80 years       BP:           132/64 mmHg Patient Gender: M              HR:           39 bpm. Exam  Location:  Inpatient Procedure: 2D Echo, Cardiac Doppler and Color Doppler Indications:    R55 Syncope  History:        Patient has prior history of Echocardiogram examinations, most                 recent 05/23/2015. CAD; Risk Factors:Hypertension and Diabetes.  Sonographer:    Andee Lineman Referring Phys: [id number] Emeline General  Sonographer Comments: Suboptimal subcostal window. IMPRESSIONS  1. Left ventricular ejection fraction, by estimation, is 50 to 55%. The left ventricle has low normal function. The left ventricle has no regional wall motion abnormalities. There is mild left ventricular hypertrophy. Left ventricular diastolic parameters are indeterminate.  2. Right ventricular systolic function is normal. The right ventricular size is normal. Tricuspid regurgitation signal is inadequate for assessing PA pressure.  3. Left atrial size was moderately dilated.  4. Right atrial size was mildly dilated.  5. The mitral valve is grossly normal. Mild mitral valve regurgitation. No evidence of mitral stenosis.  6. The aortic valve is grossly normal. There is moderate calcification of the aortic valve. Aortic valve regurgitation is trivial. Mild aortic valve sclerosis is present, with no evidence of aortic valve stenosis.  7. Aortic dilatation noted. There is mild dilatation of the aortic root, measuring 41 mm. FINDINGS  Left Ventricle: Left ventricular ejection fraction, by estimation, is 50 to 55%. The left ventricle has low normal function. The left ventricle has no regional wall motion abnormalities. The left ventricular internal cavity size was normal in size. There is mild left ventricular hypertrophy. Left ventricular diastolic parameters are indeterminate. Right Ventricle: The right ventricular size is normal. No increase in right ventricular wall thickness. Right ventricular systolic function is normal. Tricuspid regurgitation signal is inadequate for assessing PA pressure. Left Atrium: Left atrial size was  moderately dilated. Right Atrium: Right atrial size was mildly dilated. Pericardium: There is no evidence of pericardial effusion. Mitral Valve: The mitral valve is  grossly normal. Mild mitral valve regurgitation. No evidence of mitral valve stenosis. Tricuspid Valve: The tricuspid valve is grossly normal. Tricuspid valve regurgitation is trivial. Aortic Valve: The aortic valve is grossly normal. There is moderate calcification of the aortic valve. Aortic valve regurgitation is trivial. Aortic regurgitation PHT measures 794 msec. Mild aortic valve sclerosis is present, with no evidence of aortic valve stenosis. Aortic valve mean gradient measures 7.0 mmHg. Aortic valve peak gradient measures 14.7 mmHg. Aortic valve area, by VTI measures 2.19 cm. Pulmonic Valve: The pulmonic valve was grossly normal. Pulmonic valve regurgitation is trivial. Aorta: Aortic dilatation noted. There is mild dilatation of the aortic root, measuring 41 mm. Venous: The inferior vena cava was not well visualized. IAS/Shunts: The interatrial septum was not well visualized. EKG: Rhythm strip during this exam demostrated atrial flutter.  LEFT VENTRICLE PLAX 2D LVIDd:         4.70 cm  Diastology LVIDs:         3.50 cm  LV e' medial:  8.81 cm/s LV PW:         1.20 cm  LV e' lateral: 14.80 cm/s LV IVS:        1.10 cm LVOT diam:     2.45 cm LV SV:         95 LV SV Index:   52 LVOT Area:     4.71 cm  RIGHT VENTRICLE RV S prime:     10.10 cm/s TAPSE (M-mode): 2.4 cm LEFT ATRIUM             Index       RIGHT ATRIUM           Index LA diam:        4.80 cm 2.64 cm/m  RA Area:     21.00 cm LA Vol (A2C):   69.6 ml 38.27 ml/m RA Volume:   58.10 ml  31.94 ml/m LA Vol (A4C):   99.7 ml 54.81 ml/m LA Biplane Vol: 90.7 ml 49.87 ml/m  AORTIC VALVE                    PULMONIC VALVE AV Area (Vmax):    2.20 cm     PV Vmax:       0.66 m/s AV Area (Vmean):   2.01 cm     PV Peak grad:  1.8 mmHg AV Area (VTI):     2.19 cm AV Vmax:           192.00 cm/s AV  Vmean:          128.000 cm/s AV VTI:            0.435 m AV Peak Grad:      14.7 mmHg AV Mean Grad:      7.0 mmHg LVOT Vmax:         89.40 cm/s LVOT Vmean:        54.500 cm/s LVOT VTI:          0.202 m LVOT/AV VTI ratio: 0.46 AI PHT:            794 msec  AORTA Ao Root diam: 4.10 cm Ao Asc diam:  3.60 cm MITRAL VALVE            TRICUSPID VALVE MV Area (PHT): 3.19 cm TR Peak grad:   24.6 mmHg                         TR  Vmax:        248.00 cm/s                          SHUNTS                         Systemic VTI:  0.20 m                         Systemic Diam: 2.45 cm Weston Brass MD Electronically signed by Weston Brass MD Signature Date/Time: 07/17/2021/4:13:26 AM    Final     Cardiac Studies   Echo from 07/16/21:   1. Left ventricular ejection fraction, by estimation, is 50 to 55%. The  left ventricle has low normal function. The left ventricle has no regional  wall motion abnormalities. There is mild left ventricular hypertrophy.  Left ventricular diastolic  parameters are indeterminate.   2. Right ventricular systolic function is normal. The right ventricular  size is normal. Tricuspid regurgitation signal is inadequate for assessing  PA pressure.   3. Left atrial size was moderately dilated.   4. Right atrial size was mildly dilated.   5. The mitral valve is grossly normal. Mild mitral valve regurgitation.  No evidence of mitral stenosis.   6. The aortic valve is grossly normal. There is moderate calcification of  the aortic valve. Aortic valve regurgitation is trivial. Mild aortic valve  sclerosis is present, with no evidence of aortic valve stenosis.   7. Aortic dilatation noted. There is mild dilatation of the aortic root,  measuring 41 mm.   Patient Profile     [name] [name] is a 80 y.o. male with a hx of CAD with hx of CABG x 6 in 1998, RBBB, sinus bradycardia, type 2 DM, chronic venous insufficiency, PAD, asymptomatic carotid stenosis, HTN, HLD, hypothyroidism, memory loss with  suspected mild dementia, BPH, cardiology is following since 07/16/2021 for sinus bradycardia and new onset of atrial fibrillation.  Assessment & Plan    Lightheadedness with near syncope Generalized weakness with fatigue  - cardiac workup as below - consider non-cardiac etiology  for dizziness/weakness, such as CVA, B 12 deficiency, UTI,  etc, will defer further work up to IM - will request PT to see today and monitor tolerance of activity, no dizziness with resting    New onset of A fib with slowed ventricular response  Sinus bradycardia  Mobitz II AVB - dizziness/weakness possibly related to new onset of A fib with slowed VR - HR was down to 30s at admission  - metoprolol 12.5mg  XL discontinued, will allow 24hr for metabolism - TSH pending  - Echo 07/16/21 showed EF 50-55%, no RMWA, mild LVH, indeterminate diastolic parameters, moderate dilated LA, mild dilated RA, mild MR, trivial AR, mild aortic sclerosis, mild dilatation of the aortic root 41 mm.  - CHADS2VASc is 5, agree with anticoagulation, may transition heparin gtt to Eliquis 5mg  BID at discharge, heparin gtt held now with anticipation of PPM placement - Covered to SB with rate of 50-60s today with 2:1 AVB, PT to see for tolerance of activity, will consult EP for evaluation of possible PPM candidate today    CAD with hx of CABG x6 in 1998  - no angina symptoms, trop 70 x1,  2nd Trop pending, EKG non-ischemic - continue statin, ok to continue ASA 81mg  with initiation of Eliquis per MD ; stopped BB due bradycardia  HTN - BP is controlled, stopped metoprolol, continued on amlodipine 5mg  and HCTZ 25mg  daily and lisinopril 40mg  daily    HLD - continue statin    Type 2 DM Memory loss with suspected dementia  Hypothyroidism BPH  - managed per IM   For questions or updates, please contact [facility] Please consult [email or web address] for contact info under        Signed, , NP  07/17/2021, 9:23 AM

## 2021-07-17 NOTE — Progress Notes (Signed)
Dr Harless Litten in to re-check patient.  Wife at bedisde.  Patient will be discharge later today thru short stay.

## 2021-07-18 ENCOUNTER — Encounter (HOSPITAL_COMMUNITY): Payer: Self-pay | Admitting: Internal Medicine

## 2021-07-18 ENCOUNTER — Telehealth: Payer: Self-pay

## 2021-07-18 DIAGNOSIS — I4891 Unspecified atrial fibrillation: Secondary | ICD-10-CM | POA: Diagnosis not present

## 2021-07-18 DIAGNOSIS — E7800 Pure hypercholesterolemia, unspecified: Secondary | ICD-10-CM | POA: Diagnosis not present

## 2021-07-18 DIAGNOSIS — E119 Type 2 diabetes mellitus without complications: Secondary | ICD-10-CM | POA: Diagnosis not present

## 2021-07-18 DIAGNOSIS — E039 Hypothyroidism, unspecified: Secondary | ICD-10-CM | POA: Diagnosis not present

## 2021-07-18 DIAGNOSIS — E1151 Type 2 diabetes mellitus with diabetic peripheral angiopathy without gangrene: Secondary | ICD-10-CM | POA: Diagnosis not present

## 2021-07-18 DIAGNOSIS — E782 Mixed hyperlipidemia: Secondary | ICD-10-CM | POA: Diagnosis not present

## 2021-07-18 DIAGNOSIS — N400 Enlarged prostate without lower urinary tract symptoms: Secondary | ICD-10-CM | POA: Diagnosis not present

## 2021-07-18 DIAGNOSIS — I10 Essential (primary) hypertension: Secondary | ICD-10-CM | POA: Diagnosis not present

## 2021-07-18 NOTE — Telephone Encounter (Signed)
-----   Message from Sheilah Pigeon, [place] sent at 07/17/2021  2:37 PM EDT ----- Same day d/c  MDT pacer   GT

## 2021-07-18 NOTE — Telephone Encounter (Signed)
Follow-up after same day discharge: Implant date: 07/17/21 MD: Lewayne Bunting, MD Device: [employer]tronic Azure PPM Location: Left Chest Wound check visit: 07/30/21 90 day MD follow-up: 10/28/2021  Remote Transmission received: No, not available to send right now. Will follow up at later time.  Dressing removed: No, not able to at this time. Will remove later today per patient.   Phone number provided with further questions or concerns.

## 2021-07-21 NOTE — Telephone Encounter (Signed)
Remote transmission received and reviewed. No alerts triggered and lead measurements given are WNL.

## 2021-07-24 DIAGNOSIS — Z23 Encounter for immunization: Secondary | ICD-10-CM | POA: Diagnosis not present

## 2021-07-24 DIAGNOSIS — I2510 Atherosclerotic heart disease of native coronary artery without angina pectoris: Secondary | ICD-10-CM | POA: Diagnosis not present

## 2021-07-24 DIAGNOSIS — Z950 Presence of cardiac pacemaker: Secondary | ICD-10-CM | POA: Diagnosis not present

## 2021-07-24 DIAGNOSIS — I4891 Unspecified atrial fibrillation: Secondary | ICD-10-CM | POA: Diagnosis not present

## 2021-07-24 DIAGNOSIS — K4090 Unilateral inguinal hernia, without obstruction or gangrene, not specified as recurrent: Secondary | ICD-10-CM | POA: Diagnosis not present

## 2021-07-24 DIAGNOSIS — E7800 Pure hypercholesterolemia, unspecified: Secondary | ICD-10-CM | POA: Diagnosis not present

## 2021-07-24 DIAGNOSIS — Z7984 Long term (current) use of oral hypoglycemic drugs: Secondary | ICD-10-CM | POA: Diagnosis not present

## 2021-07-24 DIAGNOSIS — E1151 Type 2 diabetes mellitus with diabetic peripheral angiopathy without gangrene: Secondary | ICD-10-CM | POA: Diagnosis not present

## 2021-07-24 DIAGNOSIS — I10 Essential (primary) hypertension: Secondary | ICD-10-CM | POA: Diagnosis not present

## 2021-07-25 ENCOUNTER — Other Ambulatory Visit (HOSPITAL_COMMUNITY): Payer: Self-pay

## 2021-07-28 DIAGNOSIS — E538 Deficiency of other specified B group vitamins: Secondary | ICD-10-CM | POA: Diagnosis not present

## 2021-07-30 ENCOUNTER — Ambulatory Visit (INDEPENDENT_AMBULATORY_CARE_PROVIDER_SITE_OTHER): Payer: PPO

## 2021-07-30 ENCOUNTER — Other Ambulatory Visit: Payer: Self-pay

## 2021-07-30 DIAGNOSIS — R001 Bradycardia, unspecified: Secondary | ICD-10-CM | POA: Diagnosis not present

## 2021-07-30 DIAGNOSIS — R55 Syncope and collapse: Secondary | ICD-10-CM

## 2021-07-30 LAB — CUP PACEART INCLINIC DEVICE CHECK
Battery Remaining Longevity: 132 mo
Battery Voltage: 3.21 V
Brady Statistic AP VP Percent: 72.85 %
Brady Statistic AP VS Percent: 0.73 %
Brady Statistic AS VP Percent: 22.46 %
Brady Statistic AS VS Percent: 3.97 %
Brady Statistic RA Percent Paced: 74.15 %
Brady Statistic RV Percent Paced: 95.3 %
Date Time Interrogation Session: 20221005154030
Implantable Lead Implant Date: 20220922
Implantable Lead Implant Date: 20220922
Implantable Lead Location: 753859
Implantable Lead Location: 753860
Implantable Lead Model: 3830
Implantable Lead Model: 5076
Implantable Pulse Generator Implant Date: 20220922
Lead Channel Impedance Value: 380 Ohm
Lead Channel Impedance Value: 399 Ohm
Lead Channel Impedance Value: 570 Ohm
Lead Channel Impedance Value: 570 Ohm
Lead Channel Pacing Threshold Amplitude: 0.75 V
Lead Channel Pacing Threshold Amplitude: 1 V
Lead Channel Pacing Threshold Pulse Width: 0.4 ms
Lead Channel Pacing Threshold Pulse Width: 0.4 ms
Lead Channel Sensing Intrinsic Amplitude: 18.125 mV
Lead Channel Sensing Intrinsic Amplitude: 5.25 mV
Lead Channel Setting Pacing Amplitude: 3.5 V
Lead Channel Setting Pacing Amplitude: 3.5 V
Lead Channel Setting Pacing Pulse Width: 0.4 ms
Lead Channel Setting Sensing Sensitivity: 0.9 mV

## 2021-07-30 NOTE — Patient Instructions (Signed)

## 2021-07-30 NOTE — Progress Notes (Signed)
Wound check appointment. Steri-strips removed. Wound without redness or edema. Incision edges approximated, wound well healed. Normal device function. Thresholds, sensing, and impedances consistent with implant measurements. Device programmed at 3.5V for extra safety margin until 3 month visit. Histogram distribution appropriate for patient and level of activity. No mode switches or high ventricular rates noted. Patient educated about wound care, arm mobility, lifting restrictions. Patient is enrolled in remote monitoring, next scheduled check, 10/21/21.  ROV on 10/28/21 with Dr. Ladona Ridgel.

## 2021-08-01 NOTE — Discharge Summary (Signed)
Physician Discharge Summary  [name] [name] XTG:[id number] DOB: 03/17/1941 DOA: 07/16/2021  PCP: Seward Carol, MD  Admit date: 07/16/2021 Discharge date: 07/17/2021  Recommendations for Outpatient Follow-up:  Wear sling as per cardiology instructions. Follow up with cardiology as outpatient as directed.  Follow up with PCP in 7-10 days. Follow up with cardiology as directed.   Follow-up Information     [place] [facility] Follow up.   Specialty: Cardiology Why: 07/30/21 @ 3:20PM, wound check visit Contact information: [address], [address]        [name], MD Follow up.   Specialty: Cardiology Why: 10/28/21 @ 2:30PM Contact information: [address] [address] [phone]                Discharge Diagnoses: Principal diagnosis is #1 Syncope and generalized weakness due to atrial fibrillation with bradycardia. CAD s/p CABG x 6 DM II Hyperlipidemia   Discharge Condition: Fair  Disposition: Home  Diet recommendation: Heart healthy with modified carbohydrates  Filed Weights   07/16/21 1045  Weight: 72.6 kg    History of present illness:   [name] [name] is a 80 y.o. male with medical history significant of CAD with CABG x6 in 1998, HTN, HLD, IIDM, dementia, hypothyroidism, BPH, came with worsening of generalized weakness and near syncope's.   Patient has had refractory HTN on multiple BP medications including metoprolol 25 mg twice daily.  2 weeks ago, patient started to have increasing generalized weakness, episode of near syncope when patient experienced lightheadedness and blurry vision and had to sit down and lie down, he denied any chest pains, no shortness of breath associated with any of the episodes.  He went to see PCP on September 9, who told him " slow heart beat" and decrease metoprolol from 25 mg daily to 12.5 mg daily.  Despite, patient continued  to experience frequent episodes of generalized weakness and lightheadedness.  Today, patient went to see PCP again, EKG showed bradycardia and new onset of A. fib.   ED Course: Blood pressure stable, heart rate ranges 36 to 45 bpm, EKG showed A. fib with bradycardia.    Hospital Course:  The patient was admitted to a telemetry bed. Cardiology was consulted. He was found to be in second degree AV block Mobitz 2, 2-1 block with high grade conduction disease. Echocardiogram is unchanged from previous with EF of 50-55%. He underwent placement of pacemaker. On 07/17/2021 the patient was cleared for discharge by cardiology. He was discharged to home in fair condition. Wound care and activity restrictions were imparted by Dr. Lovena Le prior to dc. He was started on Eliquis 5 mg bid to start on 07/22/2021. He was discharged to home in fair condition following device check and CXR were completed.   Today's assessment: S: The patient is resting in bed. He is anxious to go home. O: Vitals:  Vitals:   07/17/21 1700 07/17/21 1755  BP: 130/63 (!) 158/93  Pulse: 70 61  Resp: 16 18  Temp:    SpO2: 95% 93%    Constitutional:  The patient is awake, alert, and oriented x 3. No acute distress. Respiratory:  CTA bilaterally, no w/r/r.  Respiratory effort normal. No retractions or accessory muscle use Cardiovascular:  RRR, no m/r/g No LE extremity edema   Normal pedal pulses Abdomen:  Abdomen appears normal; no tenderness or masses No hernias No HSM Musculoskeletal:  Digits/nails: no clubbing,  cyanosis, petechiae, infection exam of joints, bones, muscles of at least one of following: head/neck, RUE, LUE, RLE, LLE   Skin:  No rashes, lesions, ulcers palpation of skin: no induration or nodules Neurologic:  CN 2-12 intact Sensation all 4 extremities intact Psychiatric:  judgement and insight appear normal Mental status Mood, affect appropriate Orientation to person, place, time  Discharge  Instructions  Discharge Instructions     Activity as tolerated - No restrictions   Complete by: As directed    Wear sling and restrict movement of left upper extremity as per cardiology instructions. No driving until cleared by cardiology.   Call MD for:  difficulty breathing, headache or visual disturbances   Complete by: As directed    Call MD for:  severe uncontrolled pain   Complete by: As directed    Call MD for:  temperature >100.4   Complete by: As directed    Diet - low sodium heart healthy   Complete by: As directed    Diet Carb Modified   Complete by: As directed    Discharge instructions   Complete by: As directed    Wear sling as per cardiology instructions. Follow up with cardiology as outpatient as directed.  Follow up with PCP in 7-10 days.   Increase activity slowly   Complete by: As directed       Allergies as of 07/17/2021       Reactions   Spironolactone Rash   Penicillins Rash   Has patient had a PCN reaction causing immediate rash, facial/tongue/throat swelling, SOB or lightheadedness with hypotension:no Has patient had a PCN reaction causing severe rash involving mucus membranes or skin necrosis: no Has patient had a PCN reaction that required hospitalization no Has patient had a PCN reaction occurring within the last 10 years: no If all of the above answers are "NO", then may proceed with Cephalosporin use.        Medication List     STOP taking these medications    aspirin EC 81 MG tablet       TAKE these medications    acetaminophen 325 MG tablet Commonly known as: TYLENOL Take 1-2 tablets (325-650 mg total) by mouth every 4 (four) hours as needed for mild pain.   amLODipine 5 MG tablet Commonly known as: NORVASC Take 5 mg by mouth daily.   atorvastatin 80 MG tablet Commonly known as: LIPITOR Take 40 mg by mouth every morning.   Cyanocobalamin 1000 MCG/ML Kit Inject 1,000 mcg as directed every 30 (thirty) days.   Eliquis 5 MG  Tabs tablet Generic drug: apixaban Start taking 07/22/21 in the morning 1 tablet (5 mg total) by mouth 2 (two) times daily.   finasteride 5 MG tablet Commonly known as: PROSCAR Take 5 mg by mouth every morning.   FLAX SEEDS PO Take 1 capsule by mouth daily.   hydrochlorothiazide 25 MG tablet Commonly known as: HYDRODIURIL Take 25 mg by mouth every morning.   levothyroxine 100 MCG tablet Commonly known as: SYNTHROID Take 100 mcg by mouth daily before breakfast.   lisinopril 40 MG tablet Commonly known as: ZESTRIL Take 40 mg by mouth every morning.   memantine 10 MG tablet Commonly known as: NAMENDA Take 1 tablet (10 mg total) by mouth 2 (two) times daily.   metFORMIN 500 MG tablet Commonly known as: GLUCOPHAGE Take 500 mg by mouth daily.   metoprolol succinate 25 MG 24 hr tablet Commonly known as: TOPROL-XL Take 1 tablet (25 mg total)  by mouth daily.   nitroGLYCERIN 0.4 MG SL tablet Commonly known as: NITROSTAT DISSOLVE ONE TABLET UNDER THE TONGUE EVERY 5 MINUTES AS NEEDED FOR CHEST PAIN What changed: See the new instructions.   potassium chloride SA 20 MEQ tablet Commonly known as: KLOR-CON Take 10 mEq by mouth every morning.   tamsulosin 0.4 MG Caps capsule Commonly known as: FLOMAX Take 0.4 mg by mouth daily.       Allergies  Allergen Reactions   Spironolactone Rash   Penicillins Rash    Has patient had a PCN reaction causing immediate rash, facial/tongue/throat swelling, SOB or lightheadedness with hypotension:no Has patient had a PCN reaction causing severe rash involving mucus membranes or skin necrosis: no Has patient had a PCN reaction that required hospitalization no Has patient had a PCN reaction occurring within the last 10 years: no If all of the above answers are "NO", then may proceed with Cephalosporin use.     The results of significant diagnostics from this hospitalization (including imaging, microbiology, ancillary and laboratory) are  listed below for reference.    Significant Diagnostic Studies: DG Chest 2 View  Result Date: 07/17/2021 CLINICAL DATA:  Pacemaker. EXAM: CHEST - 2 VIEW COMPARISON:  Chest x-ray 07/16/2021. FINDINGS: There is a new left-sided pacemaker in place. There is no evidence for pneumothorax. Patient is status post cardiac surgery. The aorta is tortuous with atherosclerotic calcification. The heart is mildly enlarged and unchanged. There is no focal lung infiltrate, pleural effusion or pneumothorax. No acute fractures are seen. IMPRESSION: 1. New left-sided pacemaker.  No pneumothorax. 2. Stable cardiomegaly. Electronically Signed   By: Ronney Asters M.D.   On: 07/17/2021 16:10   EP PPM/ICD IMPLANT  Result Date: 07/17/2021 CONCLUSIONS:  1. Successful implantation of a medtronic dual-chamber pacemaker for symptomatic bradycardia due to 2:1 AV block  2. No early apparent complications.       Cristopher Peru, MD 07/17/2021 12:05 PM   DG Chest Port 1 View  Result Date: 07/16/2021 CLINICAL DATA:  Dizziness. EXAM: PORTABLE CHEST 1 VIEW COMPARISON:  June 13, 2013. FINDINGS: Stable cardiomediastinal silhouette. Status post coronary bypass graft. No acute pulmonary disease is noted. Bony thorax is unremarkable. IMPRESSION: No active disease. Electronically Signed   By: Marijo Conception M.D.   On: 07/16/2021 11:36   ECHOCARDIOGRAM COMPLETE  Result Date: 07/17/2021    ECHOCARDIOGRAM REPORT   Patient Name:   [name] [name] Date of Exam: 07/16/2021 Medical Rec #:  [id number]      Height:       66.0 in Accession #:    [id number]     Weight:       160.0 lb Date of Birth:  August 27, 1941       BSA:          1.819 m Patient Age:    50 years       BP:           132/64 mmHg Patient Gender: M              HR:           39 bpm. Exam Location:  Inpatient Procedure: 2D Echo, Cardiac Doppler and Color Doppler Indications:    R55 Syncope  History:        Patient has prior history of Echocardiogram examinations, most                 recent  05/23/2015. CAD; Risk Factors:Hypertension and Diabetes.  Sonographer:  Tawnya Crook Referring Phys: [id number] [facility]  Sonographer Comments: Suboptimal subcostal window. IMPRESSIONS  1. Left ventricular ejection fraction, by estimation, is 50 to 55%. The left ventricle has low normal function. The left ventricle has no regional wall motion abnormalities. There is mild left ventricular hypertrophy. Left ventricular diastolic parameters are indeterminate.  2. Right ventricular systolic function is normal. The right ventricular size is normal. Tricuspid regurgitation signal is inadequate for assessing PA pressure.  3. Left atrial size was moderately dilated.  4. Right atrial size was mildly dilated.  5. The mitral valve is grossly normal. Mild mitral valve regurgitation. No evidence of mitral stenosis.  6. The aortic valve is grossly normal. There is moderate calcification of the aortic valve. Aortic valve regurgitation is trivial. Mild aortic valve sclerosis is present, with no evidence of aortic valve stenosis.  7. Aortic dilatation noted. There is mild dilatation of the aortic root, measuring 41 mm. FINDINGS  Left Ventricle: Left ventricular ejection fraction, by estimation, is 50 to 55%. The left ventricle has low normal function. The left ventricle has no regional wall motion abnormalities. The left ventricular internal cavity size was normal in size. There is mild left ventricular hypertrophy. Left ventricular diastolic parameters are indeterminate. Right Ventricle: The right ventricular size is normal. No increase in right ventricular wall thickness. Right ventricular systolic function is normal. Tricuspid regurgitation signal is inadequate for assessing PA pressure. Left Atrium: Left atrial size was moderately dilated. Right Atrium: Right atrial size was mildly dilated. Pericardium: There is no evidence of pericardial effusion. Mitral Valve: The mitral valve is grossly normal. Mild mitral valve  regurgitation. No evidence of mitral valve stenosis. Tricuspid Valve: The tricuspid valve is grossly normal. Tricuspid valve regurgitation is trivial. Aortic Valve: The aortic valve is grossly normal. There is moderate calcification of the aortic valve. Aortic valve regurgitation is trivial. Aortic regurgitation PHT measures 794 msec. Mild aortic valve sclerosis is present, with no evidence of aortic valve stenosis. Aortic valve mean gradient measures 7.0 mmHg. Aortic valve peak gradient measures 14.7 mmHg. Aortic valve area, by VTI measures 2.19 cm. Pulmonic Valve: The pulmonic valve was grossly normal. Pulmonic valve regurgitation is trivial. Aorta: Aortic dilatation noted. There is mild dilatation of the aortic root, measuring 41 mm. Venous: The inferior vena cava was not well visualized. IAS/Shunts: The interatrial septum was not well visualized. EKG: Rhythm strip during this exam demostrated atrial flutter.  LEFT VENTRICLE PLAX 2D LVIDd:         4.70 cm  Diastology LVIDs:         3.50 cm  LV e' medial:  8.81 cm/s LV PW:         1.20 cm  LV e' lateral: 14.80 cm/s LV IVS:        1.10 cm LVOT diam:     2.45 cm LV SV:         95 LV SV Index:   52 LVOT Area:     4.71 cm  RIGHT VENTRICLE RV S prime:     10.10 cm/s TAPSE (M-mode): 2.4 cm LEFT ATRIUM             Index       RIGHT ATRIUM           Index LA diam:        4.80 cm 2.64 cm/m  RA Area:     21.00 cm LA Vol (A2C):   69.6 ml 38.27 ml/m RA Volume:   58.10 ml  31.94 ml/m LA Vol (A4C):   99.7 ml 54.81 ml/m LA Biplane Vol: 90.7 ml 49.87 ml/m  AORTIC VALVE                    PULMONIC VALVE AV Area (Vmax):    2.20 cm     PV Vmax:       0.66 m/s AV Area (Vmean):   2.01 cm     PV Peak grad:  1.8 mmHg AV Area (VTI):     2.19 cm AV Vmax:           192.00 cm/s AV Vmean:          128.000 cm/s AV VTI:            0.435 m AV Peak Grad:      14.7 mmHg AV Mean Grad:      7.0 mmHg LVOT Vmax:         89.40 cm/s LVOT Vmean:        54.500 cm/s LVOT VTI:          0.202 m  LVOT/AV VTI ratio: 0.46 AI PHT:            794 msec  AORTA Ao Root diam: 4.10 cm Ao Asc diam:  3.60 cm MITRAL VALVE            TRICUSPID VALVE MV Area (PHT): 3.19 cm TR Peak grad:   24.6 mmHg                         TR Vmax:        248.00 cm/s                          SHUNTS                         Systemic VTI:  0.20 m                         Systemic Diam: 2.45 cm Cherlynn Kaiser MD Electronically signed by Cherlynn Kaiser MD Signature Date/Time: 07/17/2021/4:13:26 AM    Final    CUP PACEART INCLINIC DEVICE CHECK  Result Date: 07/30/2021 Wound check appointment. Steri-strips removed. Wound without redness or edema. Incision edges approximated, wound well healed. Normal device function. Thresholds, sensing, and impedances consistent with implant measurements. Device programmed at 3.5V for  extra safety margin until 3 month visit. Histogram distribution appropriate for patient and level of activity. No mode switches or high ventricular rates noted. Patient educated about wound care, arm mobility, lifting restrictions. Patient is enrolled in remote monitoring, next scheduled check, 10/21/21.  ROV on 10/28/21 with Dr. Lovena Le.Trena Platt, BSN, RN   Microbiology: No results found for this or any previous visit (from the past 240 hour(s)).   Labs: Basic Metabolic Panel: No results for input(s): NA, K, CL, CO2, GLUCOSE, BUN, CREATININE, CALCIUM, MG, PHOS in the last 168 hours. Liver Function Tests: No results for input(s): AST, ALT, ALKPHOS, BILITOT, PROT, ALBUMIN in the last 168 hours. No results for input(s): LIPASE, AMYLASE in the last 168 hours. No results for input(s): AMMONIA in the last 168 hours. CBC: No results for input(s): WBC, NEUTROABS, HGB, HCT, MCV, PLT in the last 168 hours. Cardiac Enzymes: No results for input(s): CKTOTAL, CKMB, CKMBINDEX, TROPONINI in the last 168 hours. BNP: BNP (last 3 results) No results  for input(s): BNP in the last 8760 hours.  ProBNP (last 3 results) No  results for input(s): PROBNP in the last 8760 hours.  CBG: No results for input(s): GLUCAP in the last 168 hours.  Active Problems:   Near syncope   New onset atrial fibrillation (HCC)   Bradycardia   Time coordinating discharge: 38 minutes.  Signed:        Kelvis Berger, DO [facility]  08/01/2021, 8:39 AM

## 2021-08-04 ENCOUNTER — Telehealth (HOSPITAL_COMMUNITY): Payer: Self-pay

## 2021-08-04 ENCOUNTER — Other Ambulatory Visit (HOSPITAL_COMMUNITY): Payer: Self-pay

## 2021-08-04 NOTE — Telephone Encounter (Signed)
Pharmacy Transitions of Care Follow-up Telephone Call  Date of discharge: 07/17/21  Discharge Diagnosis: Afib  How have you been since you were released from the hospital?  Spoke with patient's wife on the phone. Patient has been well since discharge. No questions about meds at this time.  Medication changes made at discharge:      START taking: acetaminophen (TYLENOL)  Eliquis (apixaban)  STOP taking: aspirin EC 81 MG tablet   Medication changes verified by the patient? Yes    Medication Accessibility:  Home Pharmacy: not discussed   Was the patient provided with refills on discharged medications? No   Have all prescriptions been transferred from [facility] to home pharmacy? N/A   Is the patient able to afford medications? Has insurance    Medication Review:  APIXABAN (ELIQUIS)  Apixaban 5 mg BID initiated on 07/17/21.  - Discussed importance of taking medication around the same time everyday  - Advised patient of medications to avoid (NSAIDs, ASA)  - Educated that Tylenol (acetaminophen) will be the preferred analgesic to prevent risk of bleeding  - Emphasized importance of monitoring for signs and symptoms of bleeding (abnormal bruising, prolonged bleeding, nose bleeds, bleeding from gums, discolored urine, black tarry stools)  - Advised patient to alert all providers of anticoagulation therapy prior to starting a new medication or having a procedure    Follow-up Appointments:  PCP Hospital f/u appt confirmed? Already seen PCP since discharge  Specialist Hospital f/u appt confirmed? Scheduled to see Dr. Ladona Ridgel on 10/28/20 @ Cardiology.   If their condition worsens, is the pt aware to call PCP or go to the Emergency Dept.? Yes  Final Patient Assessment: Patient has follow up scheduled and knows to get refills at home pharmacy

## 2021-08-19 DIAGNOSIS — R3912 Poor urinary stream: Secondary | ICD-10-CM | POA: Diagnosis not present

## 2021-08-19 DIAGNOSIS — N200 Calculus of kidney: Secondary | ICD-10-CM | POA: Diagnosis not present

## 2021-08-19 DIAGNOSIS — N476 Balanoposthitis: Secondary | ICD-10-CM | POA: Diagnosis not present

## 2021-08-19 DIAGNOSIS — K4090 Unilateral inguinal hernia, without obstruction or gangrene, not specified as recurrent: Secondary | ICD-10-CM | POA: Diagnosis not present

## 2021-08-28 DIAGNOSIS — E538 Deficiency of other specified B group vitamins: Secondary | ICD-10-CM | POA: Diagnosis not present

## 2021-09-04 ENCOUNTER — Other Ambulatory Visit: Payer: Self-pay | Admitting: Interventional Cardiology

## 2021-09-29 DIAGNOSIS — E538 Deficiency of other specified B group vitamins: Secondary | ICD-10-CM | POA: Diagnosis not present

## 2021-10-21 ENCOUNTER — Ambulatory Visit (INDEPENDENT_AMBULATORY_CARE_PROVIDER_SITE_OTHER): Payer: PPO

## 2021-10-21 DIAGNOSIS — I4891 Unspecified atrial fibrillation: Secondary | ICD-10-CM

## 2021-10-21 LAB — CUP PACEART REMOTE DEVICE CHECK
Battery Remaining Longevity: 115 mo
Battery Voltage: 3.16 V
Brady Statistic AP VP Percent: 77.76 %
Brady Statistic AP VS Percent: 0.78 %
Brady Statistic AS VP Percent: 19.76 %
Brady Statistic AS VS Percent: 1.7 %
Brady Statistic RA Percent Paced: 79.11 %
Brady Statistic RV Percent Paced: 97.52 %
Date Time Interrogation Session: 20221227020123
Implantable Lead Implant Date: 20220922
Implantable Lead Implant Date: 20220922
Implantable Lead Location: 753859
Implantable Lead Location: 753860
Implantable Lead Model: 3830
Implantable Lead Model: 5076
Implantable Pulse Generator Implant Date: 20220922
Lead Channel Impedance Value: 342 Ohm
Lead Channel Impedance Value: 342 Ohm
Lead Channel Impedance Value: 494 Ohm
Lead Channel Impedance Value: 532 Ohm
Lead Channel Pacing Threshold Amplitude: 0.625 V
Lead Channel Pacing Threshold Amplitude: 0.75 V
Lead Channel Pacing Threshold Pulse Width: 0.4 ms
Lead Channel Pacing Threshold Pulse Width: 0.4 ms
Lead Channel Sensing Intrinsic Amplitude: 17.25 mV
Lead Channel Sensing Intrinsic Amplitude: 17.25 mV
Lead Channel Sensing Intrinsic Amplitude: 3.875 mV
Lead Channel Sensing Intrinsic Amplitude: 3.875 mV
Lead Channel Setting Pacing Amplitude: 2.75 V
Lead Channel Setting Pacing Amplitude: 2.75 V
Lead Channel Setting Pacing Pulse Width: 0.4 ms
Lead Channel Setting Sensing Sensitivity: 0.9 mV

## 2021-10-22 DIAGNOSIS — I10 Essential (primary) hypertension: Secondary | ICD-10-CM | POA: Diagnosis not present

## 2021-10-22 DIAGNOSIS — E039 Hypothyroidism, unspecified: Secondary | ICD-10-CM | POA: Diagnosis not present

## 2021-10-22 DIAGNOSIS — E782 Mixed hyperlipidemia: Secondary | ICD-10-CM | POA: Diagnosis not present

## 2021-10-22 DIAGNOSIS — E7800 Pure hypercholesterolemia, unspecified: Secondary | ICD-10-CM | POA: Diagnosis not present

## 2021-10-22 DIAGNOSIS — E1151 Type 2 diabetes mellitus with diabetic peripheral angiopathy without gangrene: Secondary | ICD-10-CM | POA: Diagnosis not present

## 2021-10-22 DIAGNOSIS — I4891 Unspecified atrial fibrillation: Secondary | ICD-10-CM | POA: Diagnosis not present

## 2021-10-22 DIAGNOSIS — I2510 Atherosclerotic heart disease of native coronary artery without angina pectoris: Secondary | ICD-10-CM | POA: Diagnosis not present

## 2021-10-22 DIAGNOSIS — N400 Enlarged prostate without lower urinary tract symptoms: Secondary | ICD-10-CM | POA: Diagnosis not present

## 2021-10-22 DIAGNOSIS — E119 Type 2 diabetes mellitus without complications: Secondary | ICD-10-CM | POA: Diagnosis not present

## 2021-10-27 NOTE — Progress Notes (Signed)
Cardiology Office Note:    Date:  10/29/2021   ID:  ERICK MURIN, DOB May 19, 1941, MRN [id number]  PCP:  Seward Carol, MD  Cardiologist:  Sinclair Grooms, MD   Referring MD: Seward Carol, MD   Chief Complaint  Patient presents with   Loss of Consciousness   Hypertension    History of Present Illness:    [name] [name] is a 81 y.o. male with a hx of  CAD, history of coronary artery bypass grafting, type 2 diabetes mellitus, permanent pacemaker, peripheral arterial disease, primary hypertension, and h/o near syncope.  Imri is accompanied by his wife.  She says he sleeps all the time.  He has decreased memory.  He has not had syncope since the pacemaker was placed in September 2022 by Dr. Lovena Le.  He was found to have A. fib with slow ventricular response that was treated with permanent pacemaker and improvement.  He is on significant therapy for hypertension.  Despite having systolic pressure less than 90, he denies lightheadedness, dizziness, or other complaints.  No angina has occurred.  No recurrence of syncope since pacemaker insertion in September.  Past Medical History:  Diagnosis Date   Benign prostatic hypertrophy with lower urinary tract symptoms (LUTS)    Bilateral edema of lower extremity    IMPROVED W/ TED HOSE   CAD (coronary artery disease)    cardiologist-  [name]--  s/p  cabg x6 in 1998   Heart murmur    Hiatal hernia    History of kidney stones    Hydronephrosis, left    Hyperlipidemia    Hypertension    Hypothyroidism    Left ureteral stone    Memory difficulties    Nephrolithiasis    right side per ct  non-obstructive   PVD (peripheral vascular disease) (HCC)    RBBB (right bundle branch block)    S/P CABG x 6    1998   Sigmoid diverticulosis    Sinus bradycardia    Type 2 diabetes mellitus ([place])     Past Surgical History:  Procedure Laterality Date   CATARACT EXTRACTION W/ INTRAOCULAR LENS  IMPLANT, BILATERAL  2011   CORONARY  ARTERY BYPASS GRAFT  1998   CABG X 6 --   LIMA  to the LAD and  other undefined SVG's   CYSTOSCOPY WITH BIOPSY N/A 11/29/2015   Procedure: CYSTOSCOPY WITH BIOPSY AND FULGERATION;  Surgeon: [name], MD;  Location: [facility];  Service: Urology;  Laterality: N/A;   CYSTOSCOPY/RETROGRADE/URETEROSCOPY/STONE EXTRACTION WITH BASKET Left 11/29/2015   Procedure: CYSTOSCOPY/RETROGRADE/DIAGNOSTIC URETEROSCOPY/STONE EXTRACTION;  Surgeon: [name], MD;  Location: [facility];  Service: Urology;  Laterality: Left;   EXTRACORPOREAL SHOCK WAVE LITHOTRIPSY Left 08-12-2015   LEFT HEART CATHETERIZATION WITH CORONARY/GRAFT ANGIOGRAM  06/16/2013   Procedure: LEFT HEART CATHETERIZATION WITH [name];  Surgeon: Sinclair Grooms, MD;  Location: [facility] CATH LAB;  Service: Cardiovascular; Native vessel occlusive disease w/ ostial total occlusion RCA, dLM, & heavy calicification LM LAD CX RCA/  SVG to diagonal patent, SVG to marginal 1 patent & 2 occluded, LIMA to dLAD patent/ dCFX collateral from obtuse marginal graft/  normal LVF   PACEMAKER IMPLANT N/A 07/17/2021   Procedure: PACEMAKER IMPLANT;  Surgeon: [name], MD;  Location: [place] [facility];  Service: Cardiovascular;  Laterality: N/A;   TRANSTHORACIC ECHOCARDIOGRAM  05-23-2015  [name]   mild LVH,  grade 1 diastolic dysfunction,  ef 55-060%,  mild AV calcifacation without stenosis,  mild AR and MR,  moderate LAE,  trivial PR and TR,  mild increase pulmonary pressure    Current Medications: Current Meds  Medication Sig   acetaminophen (TYLENOL) 325 MG tablet Take 1-2 tablets (325-650 mg total) by mouth every 4 (four) hours as needed for mild pain.   apixaban (ELIQUIS) 5 MG TABS tablet Start taking 07/22/21 in the morning 1 tablet (5 mg total) by mouth 2 (two) times daily.   atorvastatin (LIPITOR) 80 MG tablet Take 40 mg by mouth every morning.    Cyanocobalamin 1000 MCG/ML KIT Inject 1,000 mcg as  directed every 30 (thirty) days.   finasteride (PROSCAR) 5 MG tablet Take 5 mg by mouth every morning.    Flaxseed, Linseed, (FLAX SEEDS PO) Take 1 capsule by mouth daily.   hydrochlorothiazide (HYDRODIURIL) 25 MG tablet Take 25 mg by mouth every morning.    levothyroxine (SYNTHROID, LEVOTHROID) 100 MCG tablet Take 100 mcg by mouth daily before breakfast.   lisinopril (PRINIVIL,ZESTRIL) 40 MG tablet Take 40 mg by mouth every morning.    memantine (NAMENDA) 10 MG tablet Take 1 tablet (10 mg total) by mouth 2 (two) times daily.   metFORMIN (GLUCOPHAGE) 500 MG tablet Take 500 mg by mouth daily.    metoprolol succinate (TOPROL-XL) 25 MG 24 hr tablet Take 1 tablet by mouth once daily   nitroGLYCERIN (NITROSTAT) 0.4 MG SL tablet DISSOLVE ONE TABLET UNDER THE TONGUE EVERY 5 MINUTES AS NEEDED FOR CHEST PAIN (Patient taking differently: Place 0.4 mg under the tongue every 5 (five) minutes as needed.)   potassium chloride SA (K-DUR,KLOR-CON) 20 MEQ tablet Take 10 mEq by mouth every morning.    tamsulosin (FLOMAX) 0.4 MG CAPS capsule Take 0.4 mg by mouth daily.   [DISCONTINUED] amLODipine (NORVASC) 5 MG tablet Take 5 mg by mouth daily.     Allergies:   Spironolactone and Penicillins   Social History   Socioeconomic History   Marital status: Married    Spouse name: [name]   Number of children: 2   Years of education: 12   Highest education level: Not on file  Occupational History    Comment: retired  Tobacco Use   Smoking status: Former    Years: 10.00    Types: Cigarettes    Quit date: 10/26/1966    Years since quitting: 55.0   Smokeless tobacco: Never  Vaping Use   Vaping Use: Never used  Substance and Sexual Activity   Alcohol use: No    Alcohol/week: 0.0 standard drinks   Drug use: No   Sexual activity: Not on file  Other Topics Concern   Not on file  Social History Narrative   06/09/21 lives with wife   Social Determinants of Health   Financial Resource Strain: Not on file   Food Insecurity: Not on file  Transportation Needs: Not on file  Physical Activity: Not on file  Stress: Not on file  Social Connections: Not on file     Family History: The patient's family history includes CAD in his father; Heart disease in his sister; Tuberculosis in his mother.  ROS:   Please see the history of present illness.    No new data.  All other systems reviewed and are negative.  EKGs/Labs/Other Studies Reviewed:    The following studies were reviewed today: No new data  EKG:  EKG not repeated   Recent Labs: 07/16/2021: ALT 12 07/17/2021: BUN 18; Creatinine, Ser 0.89; Hemoglobin 11.1; Platelets  218; Potassium 4.0; Sodium 139  Recent Lipid Panel No results found for: CHOL, TRIG, HDL, CHOLHDL, VLDL, LDLCALC, LDLDIRECT  Physical Exam:    VS:  BP (!) 94/57    Pulse 60    Ht $R'5\' 6"'je$  (1.676 m)    Wt 165 lb 6.4 oz (75 kg)    SpO2 95%    BMI 26.70 kg/m     Wt Readings from Last 3 Encounters:  10/29/21 165 lb 6.4 oz (75 kg)  07/16/21 160 lb (72.6 kg)  06/09/21 167 lb 3.2 oz (75.8 kg)  BP was repeated and is correct.  GEN: Appears younger than stated age.  He is 39 but appears younger.. No acute distress HEENT: Normal NECK: No JVD. LYMPHATICS: No lymphadenopathy CARDIAC: No murmur. RRR no gallop, or edema. VASCULAR:  Normal Pulses. No bruits. RESPIRATORY:  Clear to auscultation without rales, wheezing or rhonchi  ABDOMEN: Soft, non-tender, non-distended, No pulsatile mass, MUSCULOSKELETAL: No deformity  SKIN: Warm and dry NEUROLOGIC:  Alert and oriented x 3 PSYCHIATRIC:  Normal affect   ASSESSMENT:    1. Coronary artery disease involving bypass graft of transplanted heart without angina pectoris   2. New onset atrial fibrillation ([place])   3. Pacemaker   4. Chronic anticoagulation   5. Type 2 diabetes mellitus with peripheral artery disease ([place])   6. Peripheral arterial disease ([place])   7. Hypotension due to drugs    PLAN:    In order of problems listed  above:  He is doing well.  No angina. New onset A. fib with slow ventricular response has been resolved with pacemaker implantation.  Continue Eliquis therapy. With Dr. Lovena Le in the pacemaker clinic. Continue Eliquis. Did not discuss Did not discuss.  No current symptoms. Low blood pressures were verified.  He is on amlodipine, HCTZ 25 mg daily, Zestril 40 mg/day, and Toprol-XL 25 mg daily.  We will discontinue amlodipine and may need to cut back on HCTZ.  His wife will record the blood pressure 2-3 times per week over the next 2 weeks and give [place] follow-up.   Medication Adjustments/Labs and Tests Ordered: Current medicines are reviewed at length with the patient today.  Concerns regarding medicines are outlined above.  No orders of the defined types were placed in this encounter.  No orders of the defined types were placed in this encounter.   Patient Instructions  Medication Instructions:  1) DISCONTINUE Amlodipine  *If you need a refill on your cardiac medications before your next appointment, please call your pharmacy*   Lab Work: None If you have labs (blood work) drawn today and your tests are completely normal, you will receive your results only by: [place] (if you have MyChart) OR A paper copy in the mail If you have any lab test that is abnormal or we need to change your treatment, we will call you to review the results.   Testing/Procedures: None   Follow-Up: At [facility], you and your health needs are our priority.  As part of our continuing mission to provide you with exceptional heart care, we have created designated Provider Care Teams.  These Care Teams include your primary Cardiologist (physician) and Advanced Practice Providers (APPs -  Physician Assistants and Nurse Practitioners) who all work together to provide you with the care you need, when you need it.  We recommend signing up for the patient portal called "MyChart".  Sign up information  is provided on this After Visit Summary.  MyChart is used  to connect with patients for Virtual Visits (Telemedicine).  Patients are able to view lab/test results, encounter notes, upcoming appointments, etc.  Non-urgent messages can be sent to your provider as well.   To learn more about what you can do with MyChart, go to [email or web address].    Your next appointment:   1 year(s)  The format for your next appointment:   In Person  Provider:   Sinclair Grooms, MD     Other Instructions     Signed, Sinclair Grooms, MD  10/29/2021 5:10 PM    [place]

## 2021-10-28 ENCOUNTER — Encounter: Payer: PPO | Admitting: Internal Medicine

## 2021-10-29 ENCOUNTER — Other Ambulatory Visit: Payer: Self-pay

## 2021-10-29 ENCOUNTER — Ambulatory Visit: Payer: PPO | Admitting: Interventional Cardiology

## 2021-10-29 ENCOUNTER — Encounter: Payer: Self-pay | Admitting: Interventional Cardiology

## 2021-10-29 VITALS — BP 94/57 | HR 60 | Ht 66.0 in | Wt 165.4 lb

## 2021-10-29 DIAGNOSIS — I4891 Unspecified atrial fibrillation: Secondary | ICD-10-CM | POA: Diagnosis not present

## 2021-10-29 DIAGNOSIS — I739 Peripheral vascular disease, unspecified: Secondary | ICD-10-CM

## 2021-10-29 DIAGNOSIS — I952 Hypotension due to drugs: Secondary | ICD-10-CM

## 2021-10-29 DIAGNOSIS — Z7901 Long term (current) use of anticoagulants: Secondary | ICD-10-CM

## 2021-10-29 DIAGNOSIS — I25812 Atherosclerosis of bypass graft of coronary artery of transplanted heart without angina pectoris: Secondary | ICD-10-CM | POA: Diagnosis not present

## 2021-10-29 DIAGNOSIS — E1151 Type 2 diabetes mellitus with diabetic peripheral angiopathy without gangrene: Secondary | ICD-10-CM | POA: Diagnosis not present

## 2021-10-29 DIAGNOSIS — Z950 Presence of cardiac pacemaker: Secondary | ICD-10-CM

## 2021-10-29 NOTE — Progress Notes (Signed)
Remote pacemaker transmission.   

## 2021-10-29 NOTE — Patient Instructions (Signed)
Medication Instructions:  ?1) DISCONTINUE Amlodipine ? ?*If you need a refill on your cardiac medications before your next appointment, please call your pharmacy* ? ? ?Lab Work: ?None ?If you have labs (blood work) drawn today and your tests are completely normal, you will receive your results only by: ?MyChart Message (if you have MyChart) OR ?A paper copy in the mail ?If you have any lab test that is abnormal or we need to change your treatment, we will call you to review the results. ? ? ?Testing/Procedures: ?None ? ? ?Follow-Up: ?At [facility], you and your health needs are our priority.  As part of our continuing mission to provide you with exceptional heart care, we have created designated Provider Care Teams.  These Care Teams include your primary Cardiologist (physician) and Advanced Practice Providers (APPs -  Physician Assistants and Nurse Practitioners) who all work together to provide you with the care you need, when you need it. ? ?We recommend signing up for the patient portal called "MyChart".  Sign up information is provided on this After Visit Summary.  MyChart is used to connect with patients for Virtual Visits (Telemedicine).  Patients are able to view lab/test results, encounter notes, upcoming appointments, etc.  Non-urgent messages can be sent to your provider as well.   ?To learn more about what you can do with MyChart, go to [email or web address].   ? ?Your next appointment:   ?1 year(s) ? ?The format for your next appointment:   ?In Person ? ?Provider:   ?Henry W Smith III, MD  ? ? ?Other Instructions ?  ?

## 2021-10-30 DIAGNOSIS — E538 Deficiency of other specified B group vitamins: Secondary | ICD-10-CM | POA: Diagnosis not present

## 2021-11-17 IMAGING — DX DG SHOULDER 2+V*L*
2 series · 2 of 2 positions shown · non-contrast
Comparison: No recent.

CLINICAL DATA: Acute left shoulder pain.  Fall 1 day ago.

EXAM:
LEFT SHOULDER - 2+ VIEW

[dg shoulder left (1 of 2)]
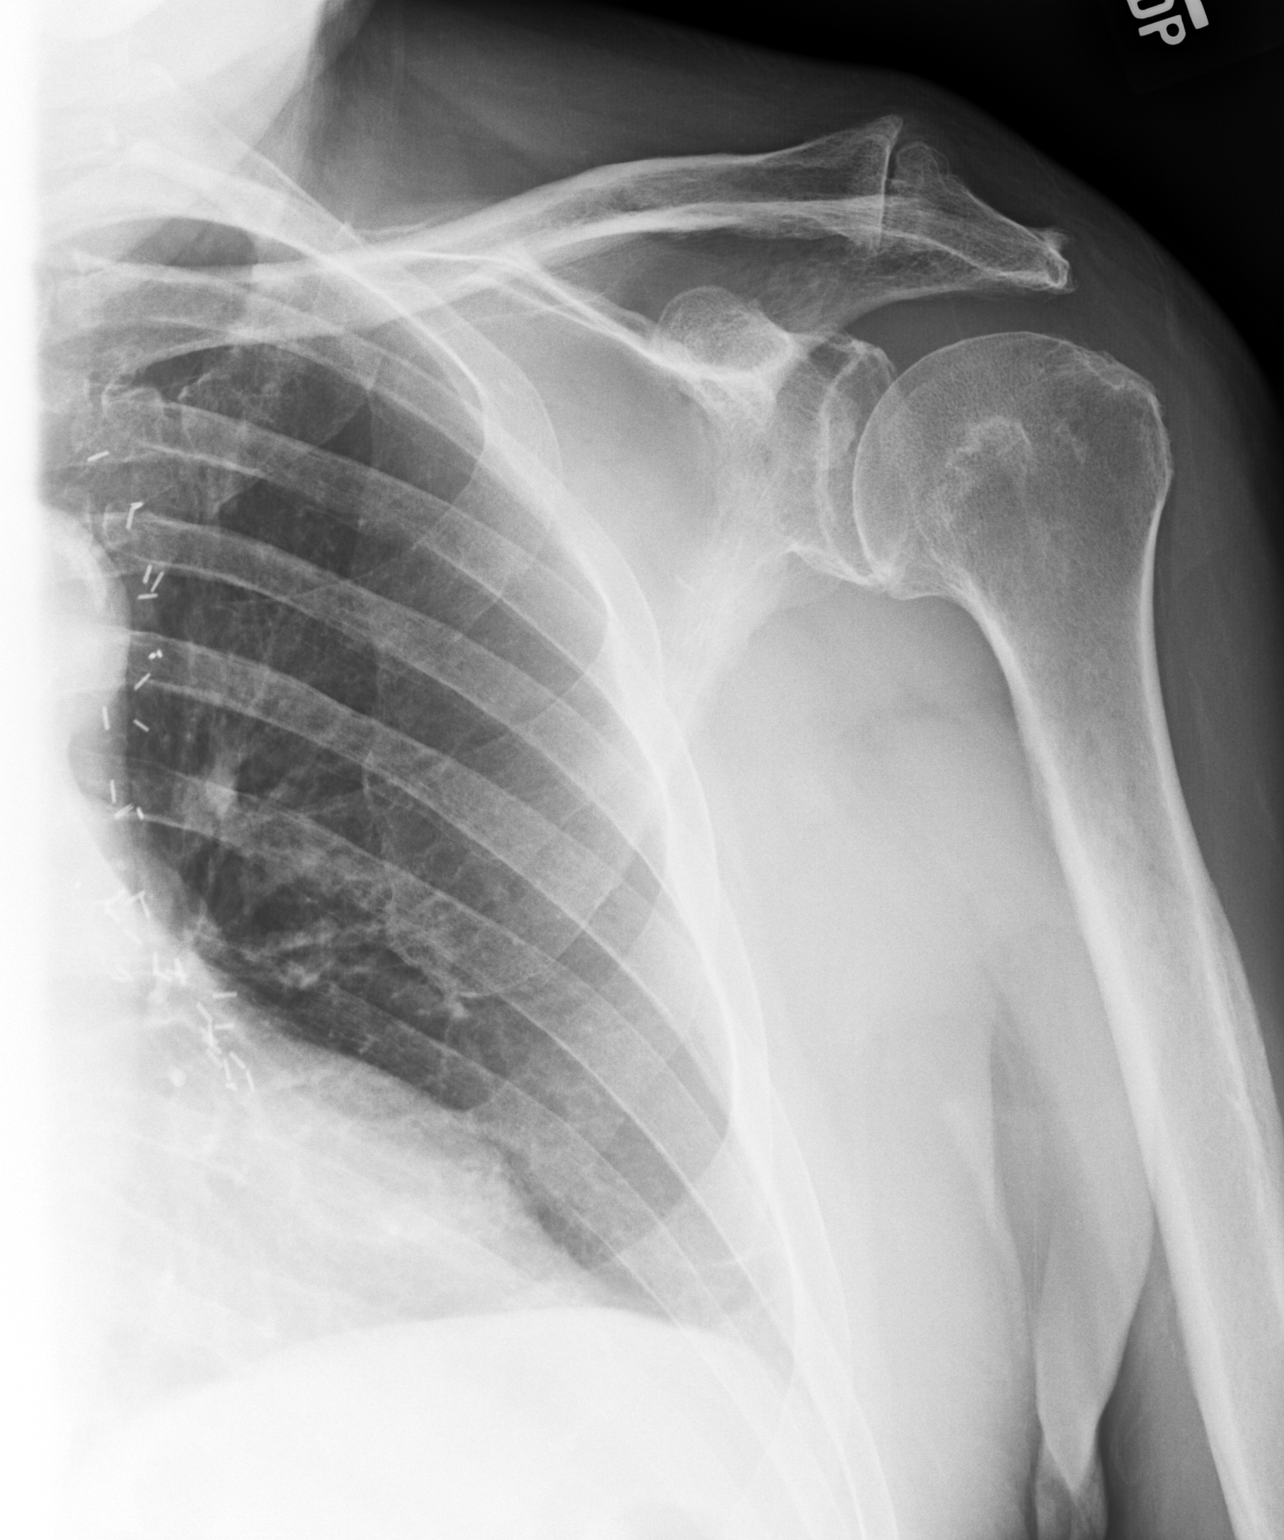

[dg shoulder left (2 of 2)]
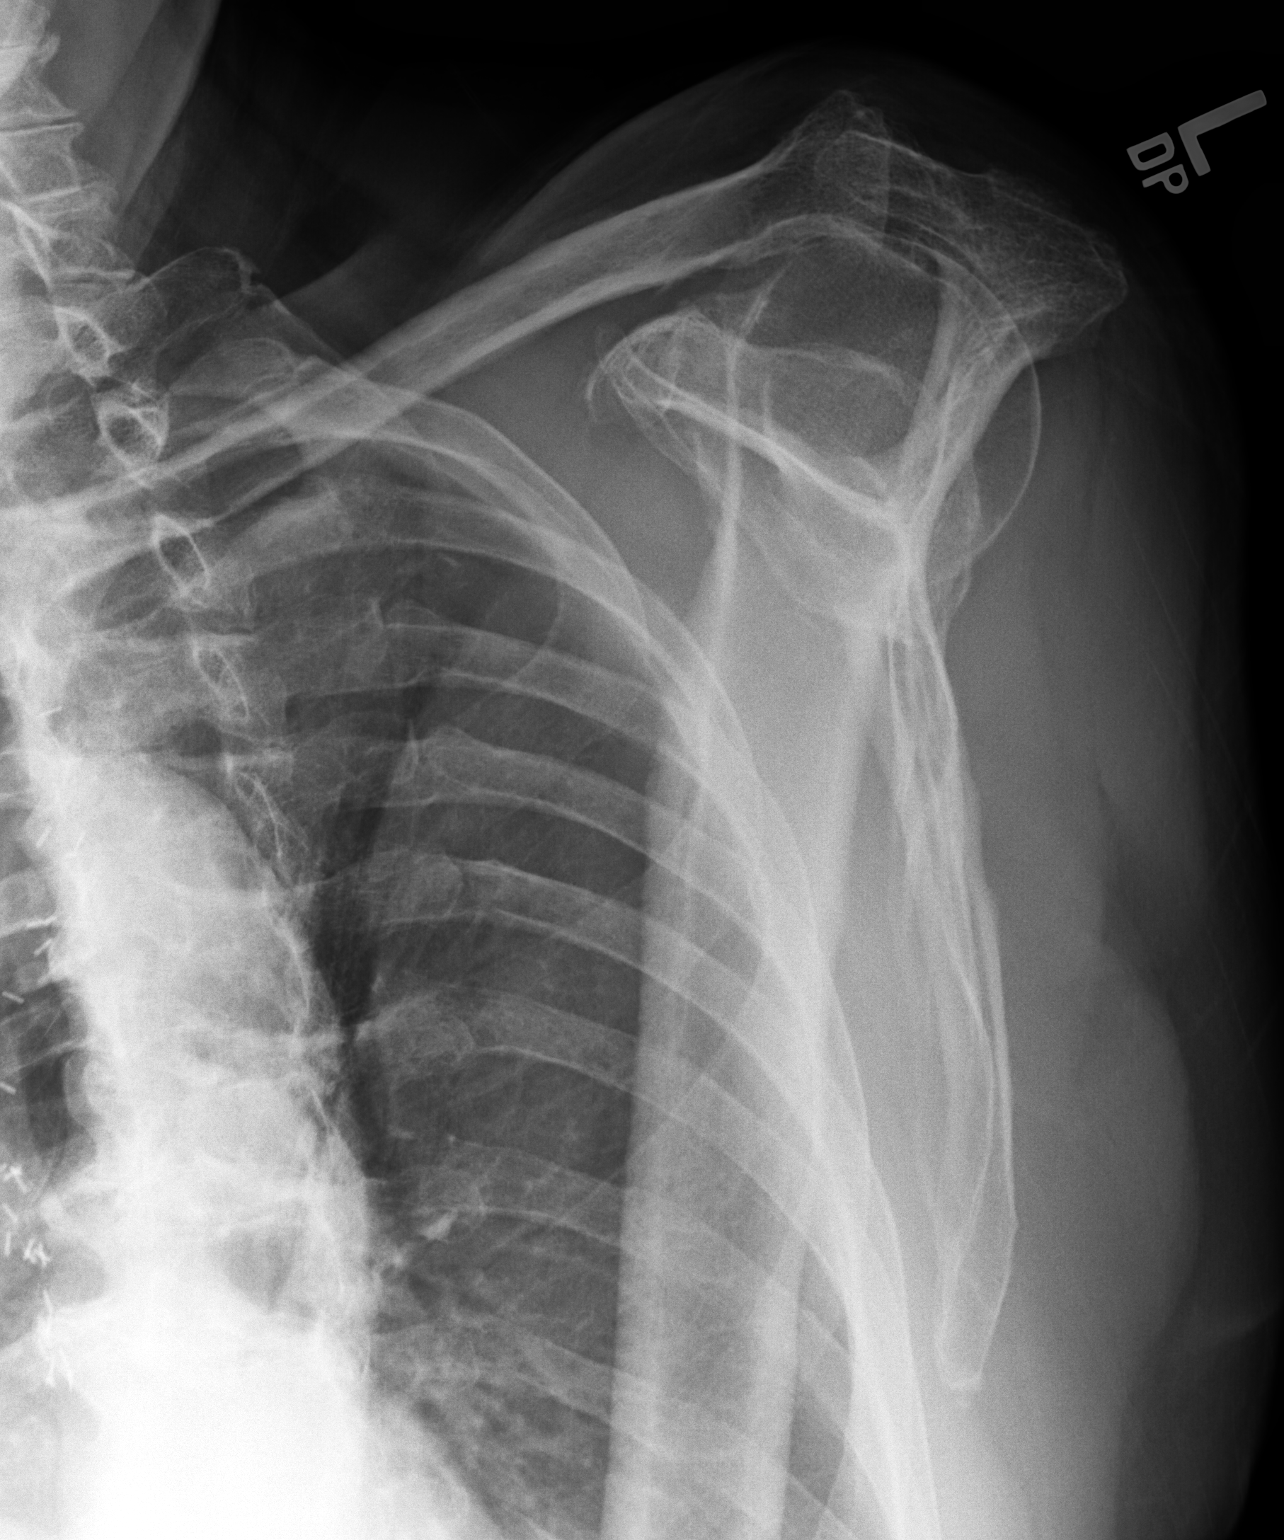

[2 of 2 positions shown; findings below may reference images not displayed]

FINDINGS: Prominent acromioclavicular and glenohumeral degenerative change. No
acute bony abnormality identified. No evidence of fracture or
dislocation. Surgical clips noted over the chest.
IMPRESSION: Prominent acromioclavicular glenohumeral degenerative change. No
acute abnormality identified.

## 2021-11-24 ENCOUNTER — Other Ambulatory Visit: Payer: Self-pay

## 2021-11-24 ENCOUNTER — Ambulatory Visit: Payer: PPO | Admitting: Internal Medicine

## 2021-11-24 ENCOUNTER — Encounter: Payer: Self-pay | Admitting: Internal Medicine

## 2021-11-24 VITALS — BP 102/66 | HR 60 | Ht 65.5 in | Wt 165.0 lb

## 2021-11-24 DIAGNOSIS — R001 Bradycardia, unspecified: Secondary | ICD-10-CM

## 2021-11-24 NOTE — Patient Instructions (Signed)
Medication Instructions:  °Your physician recommends that you continue on your current medications as directed. Please refer to the Current Medication list given to you today. °*If you need a refill on your cardiac medications before your next appointment, please call your pharmacy* ° °Lab Work: °None. °If you have labs (blood work) drawn today and your tests are completely normal, you will receive your results only by: °MyChart Message (if you have MyChart) OR °A paper copy in the mail °If you have any lab test that is abnormal or we need to change your treatment, we will call you to review the results. ° °Testing/Procedures: °None. ° °Follow-Up: °At [facility], you and your health needs are our priority.  As part of our continuing mission to provide you with exceptional heart care, we have created designated Provider Care Teams.  These Care Teams include your primary Cardiologist (physician) and Advanced Practice Providers (APPs -  Physician Assistants and Nurse Practitioners) who all work together to provide you with the care you need, when you need it. ° °Your physician wants you to follow-up in: 12 months with Gregg Taylor, MD  ° °  You will receive a reminder letter in the mail two months in advance. If you don't receive a letter, please call our office to schedule the follow-up appointment. ° °We recommend signing up for the patient portal called "MyChart".  Sign up information is provided on this After Visit Summary.  MyChart is used to connect with patients for Virtual Visits (Telemedicine).  Patients are able to view lab/test results, encounter notes, upcoming appointments, etc.  Non-urgent messages can be sent to your provider as well.   °To learn more about what you can do with MyChart, go to [email or web address].   ° °Any Other Special Instructions Will Be Listed Below (If Applicable). ° ° ° ° °  °  ° ° ° ° °  ° ° °

## 2021-11-24 NOTE — Progress Notes (Signed)
HPI Mr. Rauh returns today for followup of syncope. He is a pleasant 81 yo man with CAD s/p CABG, HTN, and dyslipidemia who developed atrial fib and CHB and underwent PPM insertion 3 months ago. In the interim, he has done well with no palpitations or syncope. He denies chest pain or sob.  Allergies  Allergen Reactions   Spironolactone Rash   Penicillins Rash    Has patient had a PCN reaction causing immediate rash, facial/tongue/throat swelling, SOB or lightheadedness with hypotension:no Has patient had a PCN reaction causing severe rash involving mucus membranes or skin necrosis: no Has patient had a PCN reaction that required hospitalization no Has patient had a PCN reaction occurring within the last 10 years: no If all of the above answers are "NO", then may proceed with Cephalosporin use.      Current Outpatient Medications  Medication Sig Dispense Refill   acetaminophen (TYLENOL) 325 MG tablet Take 1-2 tablets (325-650 mg total) by mouth every 4 (four) hours as needed for mild pain. 30 tablet 0   apixaban (ELIQUIS) 5 MG TABS tablet Start taking 07/22/21 in the morning 1 tablet (5 mg total) by mouth 2 (two) times daily. 60 tablet 0   atorvastatin (LIPITOR) 80 MG tablet Take 40 mg by mouth every morning.      Cyanocobalamin 1000 MCG/ML KIT Inject 1,000 mcg as directed every 30 (thirty) days.     finasteride (PROSCAR) 5 MG tablet Take 5 mg by mouth every morning.      Flaxseed, Linseed, (FLAX SEEDS PO) Take 1 capsule by mouth daily.     hydrochlorothiazide (HYDRODIURIL) 25 MG tablet Take 25 mg by mouth every morning.      levothyroxine (SYNTHROID, LEVOTHROID) 100 MCG tablet Take 100 mcg by mouth daily before breakfast.     memantine (NAMENDA) 10 MG tablet Take 1 tablet (10 mg total) by mouth 2 (two) times daily. 180 tablet 4   metFORMIN (GLUCOPHAGE) 500 MG tablet Take 500 mg by mouth daily.      metoprolol succinate (TOPROL-XL) 25 MG 24 hr tablet Take 1 tablet by mouth once  daily 90 tablet 0   nitroGLYCERIN (NITROSTAT) 0.4 MG SL tablet DISSOLVE ONE TABLET UNDER THE TONGUE EVERY 5 MINUTES AS NEEDED FOR CHEST PAIN (Patient taking differently: Place 0.4 mg under the tongue every 5 (five) minutes as needed.) 25 tablet 5   potassium chloride SA (K-DUR,KLOR-CON) 20 MEQ tablet Take 10 mEq by mouth every morning.      tamsulosin (FLOMAX) 0.4 MG CAPS capsule Take 0.4 mg by mouth daily.     No current facility-administered medications for this visit.     Past Medical History:  Diagnosis Date   Benign prostatic hypertrophy with lower urinary tract symptoms (LUTS)    Bilateral edema of lower extremity    IMPROVED W/ TED HOSE   CAD (coronary artery disease)    cardiologist-  [name]--  s/p  cabg x6 in 1998   Heart murmur    Hiatal hernia    History of kidney stones    Hydronephrosis, left    Hyperlipidemia    Hypertension    Hypothyroidism    Left ureteral stone    Memory difficulties    Nephrolithiasis    right side per ct  non-obstructive   PVD (peripheral vascular disease) (HCC)    RBBB (right bundle branch block)    S/P CABG x 6    1998   Sigmoid diverticulosis  Sinus bradycardia    Type 2 diabetes mellitus (HCC)     ROS:   All systems reviewed and negative except as noted in the HPI.   Past Surgical History:  Procedure Laterality Date   CATARACT EXTRACTION W/ INTRAOCULAR LENS  IMPLANT, BILATERAL  2011   CORONARY ARTERY BYPASS GRAFT  1998   CABG X 6 --   LIMA  to the LAD and  other undefined SVG's   CYSTOSCOPY WITH BIOPSY N/A 11/29/2015   Procedure: CYSTOSCOPY WITH BIOPSY AND FULGERATION;  Surgeon: [name], MD;  Location: [facility];  Service: Urology;  Laterality: N/A;   CYSTOSCOPY/RETROGRADE/URETEROSCOPY/STONE EXTRACTION WITH BASKET Left 11/29/2015   Procedure: CYSTOSCOPY/RETROGRADE/DIAGNOSTIC URETEROSCOPY/STONE EXTRACTION;  Surgeon: [name], MD;  Location: [facility];  Service: Urology;   Laterality: Left;   EXTRACORPOREAL SHOCK WAVE LITHOTRIPSY Left 08-12-2015   LEFT HEART CATHETERIZATION WITH CORONARY/GRAFT ANGIOGRAM  06/16/2013   Procedure: LEFT HEART CATHETERIZATION WITH [name];  Surgeon: Sinclair Grooms, MD;  Location: [facility] CATH LAB;  Service: Cardiovascular; Native vessel occlusive disease w/ ostial total occlusion RCA, dLM, & heavy calicification LM LAD CX RCA/  SVG to diagonal patent, SVG to marginal 1 patent & 2 occluded, LIMA to dLAD patent/ dCFX collateral from obtuse marginal graft/  normal LVF   PACEMAKER IMPLANT N/A 07/17/2021   Procedure: PACEMAKER IMPLANT;  Surgeon: [name], MD;  Location: [place] [facility];  Service: Cardiovascular;  Laterality: N/A;   TRANSTHORACIC ECHOCARDIOGRAM  05-23-2015  [name]   mild LVH,  grade 1 diastolic dysfunction,  ef 55-060%,  mild AV calcifacation without stenosis,  mild AR and MR,  moderate LAE,  trivial PR and TR,  mild increase pulmonary pressure     Family History  Problem Relation Age of Onset   Tuberculosis Mother    CAD Father    Heart disease Sister      Social History   Socioeconomic History   Marital status: Married    Spouse name: [name]   Number of children: 2   Years of education: 12   Highest education level: Not on file  Occupational History    Comment: retired  Tobacco Use   Smoking status: Former    Years: 10.00    Types: Cigarettes    Quit date: 10/26/1966    Years since quitting: 55.1   Smokeless tobacco: Never  Vaping Use   Vaping Use: Never used  Substance and Sexual Activity   Alcohol use: No    Alcohol/week: 0.0 standard drinks   Drug use: No   Sexual activity: Not on file  Other Topics Concern   Not on file  Social History Narrative   06/09/21 lives with wife   Social Determinants of Health   Financial Resource Strain: Not on file  Food Insecurity: Not on file  Transportation Needs: Not on file  Physical Activity: Not on file  Stress: Not on file   Social Connections: Not on file  Intimate Partner Violence: Not on file     BP 102/66    Pulse 60    Ht 5' 5.5" (1.664 m)    Wt 165 lb (74.8 kg)    SpO2 96%    BMI 27.04 kg/m   Physical Exam:  Well appearing NAD HEENT: Unremarkable Neck:  No JVD, no thyromegally Lymphatics:  No adenopathy Back:  No CVA tenderness Lungs:  Clear with no wheezes HEART:  Regular rate rhythm, no murmurs, no rubs, no clicks  Abd:  soft, positive bowel sounds, no organomegally, no rebound, no guarding Ext:  2 plus pulses, no edema, no cyanosis, no clubbing Skin:  No rashes no nodules Neuro:  CN II through XII intact, motor grossly intact  EKG - nsr with ventricular pacing  DEVICE  Normal device function.  See PaceArt for details.   Assess/Plan:  Stokes Adams attacks - he has been asymptomatic s/p PPM insertion. PPM - his medtronic DDD PM is working normally. PAF - he has not had more atrial fib since his PPM was inserted. He will continue his blood thinner. CAD - he is s/p remote CABG and is asymptomatic.   Carleene Overlie Pearlean Sabina,MD

## 2021-12-01 ENCOUNTER — Telehealth: Payer: Self-pay | Admitting: Interventional Cardiology

## 2021-12-01 DIAGNOSIS — E538 Deficiency of other specified B group vitamins: Secondary | ICD-10-CM | POA: Diagnosis not present

## 2021-12-01 NOTE — Telephone Encounter (Signed)
BP and HR data look pkay.

## 2021-12-01 NOTE — Telephone Encounter (Signed)
Left message to call back  

## 2021-12-01 NOTE — Telephone Encounter (Signed)
Patient's wife called to give BP readings that was collected.  139/58; 60 128/62; 60 106/52; 60 120/58; 60

## 2021-12-03 ENCOUNTER — Other Ambulatory Visit: Payer: Self-pay | Admitting: Interventional Cardiology

## 2021-12-03 NOTE — Telephone Encounter (Signed)
Left message to call back  

## 2021-12-10 NOTE — Telephone Encounter (Signed)
Left message to call back  

## 2021-12-17 DIAGNOSIS — K4090 Unilateral inguinal hernia, without obstruction or gangrene, not specified as recurrent: Secondary | ICD-10-CM | POA: Diagnosis not present

## 2021-12-18 NOTE — Telephone Encounter (Signed)
Spoke with wife and made her aware BPs look good.  Wife appreciative for call.

## 2022-01-02 DIAGNOSIS — E538 Deficiency of other specified B group vitamins: Secondary | ICD-10-CM | POA: Diagnosis not present

## 2022-01-02 DIAGNOSIS — E039 Hypothyroidism, unspecified: Secondary | ICD-10-CM | POA: Diagnosis not present

## 2022-01-02 DIAGNOSIS — Z950 Presence of cardiac pacemaker: Secondary | ICD-10-CM | POA: Diagnosis not present

## 2022-01-02 DIAGNOSIS — E1151 Type 2 diabetes mellitus with diabetic peripheral angiopathy without gangrene: Secondary | ICD-10-CM | POA: Diagnosis not present

## 2022-01-02 DIAGNOSIS — I10 Essential (primary) hypertension: Secondary | ICD-10-CM | POA: Diagnosis not present

## 2022-01-02 DIAGNOSIS — I4891 Unspecified atrial fibrillation: Secondary | ICD-10-CM | POA: Diagnosis not present

## 2022-01-02 DIAGNOSIS — E7800 Pure hypercholesterolemia, unspecified: Secondary | ICD-10-CM | POA: Diagnosis not present

## 2022-01-02 DIAGNOSIS — I2510 Atherosclerotic heart disease of native coronary artery without angina pectoris: Secondary | ICD-10-CM | POA: Diagnosis not present

## 2022-01-08 ENCOUNTER — Ambulatory Visit: Payer: Self-pay | Admitting: Surgery

## 2022-01-08 DIAGNOSIS — K4090 Unilateral inguinal hernia, without obstruction or gangrene, not specified as recurrent: Secondary | ICD-10-CM | POA: Diagnosis not present

## 2022-01-08 NOTE — H&P (Signed)
?[name] [name] ?[id number] ?Referring Provider:  Seward Carol, MD ? ? ?Subjective  ? ?Chief Complaint: Hernia ?  ? ? ?History of Present Illness: ?   ?Very pleasant 81 year old man with a history of BPH, coronary artery disease status post CABG many years ago, A-fib/rbbb/bradycardia status post pacemaker in October and on Eliquis, hiatal hernia, history of kidney stones, hypertension, hypothyroidism, dementia with worsening memory difficulties, diverticulosis and diabetes who presents with a right inguinal hernia.  This has been present for many years, but has been increasingly symptomatic recently with episodes of incarceration and associated severe pain.  The patient is a poor historian and so much of the conversation is carried by his wife.  No vomiting, abdominal distention, or other significant obstructive symptoms.  No previous abdominal surgery or hernia surgery. ? ? ?Review of Systems: ?A complete review of systems was obtained from the patient.  I have reviewed this information and discussed as appropriate with the patient.  See HPI as well for other ROS. ? ? ?Medical History: ?Past Medical History:  ?Diagnosis Date  ? Diabetes mellitus without complication (CMS-HCC)   ? Hyperlipidemia   ? Hypertension   ? Thyroid disease   ? ? ?There is no problem list on file for this patient. ? ? ?Past Surgical History:  ?Procedure Laterality Date  ? heart surgery N/A   ? pacemkaer surgery N/A   ?  ? ?Allergies  ?Allergen Reactions  ? Spironolactone Rash and Other (See Comments)  ? Other Other (See Comments)  ? Penicillins Rash  ?  Has patient had a PCN reaction causing immediate rash, facial/tongue/throat swelling, SOB or lightheadedness with hypotension:no ?Has patient had a PCN reaction causing severe rash involving mucus membranes or skin necrosis: no ?Has patient had a PCN reaction that required hospitalization no ?Has patient had a PCN reaction occurring within the last 10 years: no ?If all of the above answers  are "NO", then may proceed with Cephalosporin use. ?Has patient had a PCN reaction causing immediate rash, facial/tongue/throat swelling, SOB or lightheadedness with hypotension:no ?Has patient had a PCN reaction causing severe rash involving mucus membranes or skin necrosis: no ?Has patient had a PCN reaction that required hospitalization no ?Has patient had a PCN reaction occurring within the last 10 years: no ?If all of the above answers are "NO", then may proceed with Cephalosporin use. ?  ? ? ?Current Outpatient Medications on File Prior to Visit  ?Medication Sig Dispense Refill  ? acetaminophen (TYLENOL) 325 MG tablet Take by mouth    ? memantine (NAMENDA) 10 MG tablet 1 tablet    ? amLODIPine (NORVASC) 5 MG tablet TAKE 1 TABLET BY MOUTH ONCE DAILY IN THE EVENING FOR BLOOD PRESSURE **NEW  DIRECTIONS**    ? atorvastatin (LIPITOR) 80 MG tablet TAKE 1/2 (ONE-HALF) TABLET BY MOUTH ONCE DAILY FOR CHOLESTEROL    ? cyanocobalamin (VITAMIN B12) 1,000 mcg/mL injection as directed    ? ELIQUIS 5 mg tablet Take 5 mg by mouth 2 (two) times daily    ? finasteride (PROSCAR) 5 mg tablet 1 tablet    ? hydroCHLOROthiazide (HYDRODIURIL) 25 MG tablet Take 25 mg by mouth once daily    ? levothyroxine (SYNTHROID) 100 MCG tablet TAKE 1 TABLET BY MOUTH ONCE DAILY IN THE MORNING ON AN EMPTY STOMACH    ? metFORMIN (GLUCOPHAGE) 500 MG tablet TAKE 1 TABLET BY MOUTH ONCE DAILY WITH A MEAL    ? nitroGLYcerin (NITROSTAT) 0.4 MG SL tablet 1 tablet under  the tongue    ? potassium chloride (KLOR-CON) 20 MEQ ER tablet TAKE 1/2 (ONE-HALF)  BY MOUTH ONCE DAILY    ? tamsulosin (FLOMAX) 0.4 mg capsule 1 capsule    ? ?No current facility-administered medications on file prior to visit.  ? ? ?Family History  ?Problem Relation Age of Onset  ? Heart valve disease Father   ? High blood pressure (Hypertension) Sister   ?  ? ?Social History  ? ?Tobacco Use  ?Smoking Status Former  ? Types: Cigarettes  ?Smokeless Tobacco Never  ?  ? ?Social History   ? ?Socioeconomic History  ? Marital status: Married  ?Tobacco Use  ? Smoking status: Former  ?  Types: Cigarettes  ? Smokeless tobacco: Never  ?Vaping Use  ? Vaping Use: Never used  ?Substance and Sexual Activity  ? Alcohol use: Never  ? Drug use: Never  ? ? ?Objective:  ? ? ?Vitals:  ? 01/08/22 0940  ?BP: 118/72  ?Pulse: 56  ?Temp: 36.9 ?C (98.4 ?F)  ?SpO2: 96%  ?Weight: 77.7 kg (171 lb 3.2 oz)  ?Height: 167.6 cm (5\' 6" )  ?  ?Body mass index is 27.63 kg/m?. ? ?Alert, calm, cooperative ?Unlabored respirations ?Abdomen is soft, nontender, nondistended.  There is a large, partially incarcerated right inguinal hernia which is mildly tender but without overlying skin change.  No hernia on the left ? ? ?Assessment and Plan:  ?Diagnoses and all orders for this visit: ? ?Non-recurrent unilateral inguinal hernia without obstruction or gangrene ? ?We had a long discussion today and went over the relevant anatomy and natural history of inguinal hernias as well as options for repair.  Discussed that in the category of ongoing observation/nonsurgical treatment, ongoing episodes of pain incarceration are likely, and there is a risk of strangulation which may merit emergency surgery.  Given episodes of pain and incarceration as well as the size of the hernia and likely containing bowel, I do recommend proceeding with an open repair with mesh.  We discussed the surgery in detail including technical aspects, risks of bleeding, infection, pain, scarring, injury to bowel, bladder, vessels or nerves, postoperative issues with wound healing, seroma/hematoma, chronic pain or numbness, hernia recurrence, as well as cardiovascular/pulmonary/thromboembolic risks.   ?While his risk profile is higher given his medical comorbidities, I do worry that if he winds up in an emergency situation his risk profile will be significantly worse and therefore would recommend he consider repair.  He is reluctant at this time, but he and his wife will  continue to discuss.  He was agreeable to initiating the scheduling process and getting cardiac clearance so we will go ahead and proceed. ? ?Adham Johnson Raquel James, MD  ?

## 2022-01-08 NOTE — H&P (View-Only) (Signed)
?[name] [name] ?[id number] ?Referring Provider:  Seward Carol, MD ? ? ?Subjective  ? ?Chief Complaint: Hernia ?  ? ? ?History of Present Illness: ?   ?Very pleasant 81 year old man with a history of BPH, coronary artery disease status post CABG many years ago, A-fib/rbbb/bradycardia status post pacemaker in October and on Eliquis, hiatal hernia, history of kidney stones, hypertension, hypothyroidism, dementia with worsening memory difficulties, diverticulosis and diabetes who presents with a right inguinal hernia.  This has been present for many years, but has been increasingly symptomatic recently with episodes of incarceration and associated severe pain.  The patient is a poor historian and so much of the conversation is carried by his wife.  No vomiting, abdominal distention, or other significant obstructive symptoms.  No previous abdominal surgery or hernia surgery. ? ? ?Review of Systems: ?A complete review of systems was obtained from the patient.  I have reviewed this information and discussed as appropriate with the patient.  See HPI as well for other ROS. ? ? ?Medical History: ?Past Medical History:  ?Diagnosis Date  ? Diabetes mellitus without complication (CMS-HCC)   ? Hyperlipidemia   ? Hypertension   ? Thyroid disease   ? ? ?There is no problem list on file for this patient. ? ? ?Past Surgical History:  ?Procedure Laterality Date  ? heart surgery N/A   ? pacemkaer surgery N/A   ?  ? ?Allergies  ?Allergen Reactions  ? Spironolactone Rash and Other (See Comments)  ? Other Other (See Comments)  ? Penicillins Rash  ?  Has patient had a PCN reaction causing immediate rash, facial/tongue/throat swelling, SOB or lightheadedness with hypotension:no ?Has patient had a PCN reaction causing severe rash involving mucus membranes or skin necrosis: no ?Has patient had a PCN reaction that required hospitalization no ?Has patient had a PCN reaction occurring within the last 10 years: no ?If all of the above answers  are "NO", then may proceed with Cephalosporin use. ?Has patient had a PCN reaction causing immediate rash, facial/tongue/throat swelling, SOB or lightheadedness with hypotension:no ?Has patient had a PCN reaction causing severe rash involving mucus membranes or skin necrosis: no ?Has patient had a PCN reaction that required hospitalization no ?Has patient had a PCN reaction occurring within the last 10 years: no ?If all of the above answers are "NO", then may proceed with Cephalosporin use. ?  ? ? ?Current Outpatient Medications on File Prior to Visit  ?Medication Sig Dispense Refill  ? acetaminophen (TYLENOL) 325 MG tablet Take by mouth    ? memantine (NAMENDA) 10 MG tablet 1 tablet    ? amLODIPine (NORVASC) 5 MG tablet TAKE 1 TABLET BY MOUTH ONCE DAILY IN THE EVENING FOR BLOOD PRESSURE **NEW  DIRECTIONS**    ? atorvastatin (LIPITOR) 80 MG tablet TAKE 1/2 (ONE-HALF) TABLET BY MOUTH ONCE DAILY FOR CHOLESTEROL    ? cyanocobalamin (VITAMIN B12) 1,000 mcg/mL injection as directed    ? ELIQUIS 5 mg tablet Take 5 mg by mouth 2 (two) times daily    ? finasteride (PROSCAR) 5 mg tablet 1 tablet    ? hydroCHLOROthiazide (HYDRODIURIL) 25 MG tablet Take 25 mg by mouth once daily    ? levothyroxine (SYNTHROID) 100 MCG tablet TAKE 1 TABLET BY MOUTH ONCE DAILY IN THE MORNING ON AN EMPTY STOMACH    ? metFORMIN (GLUCOPHAGE) 500 MG tablet TAKE 1 TABLET BY MOUTH ONCE DAILY WITH A MEAL    ? nitroGLYcerin (NITROSTAT) 0.4 MG SL tablet 1 tablet under  the tongue    ? potassium chloride (KLOR-CON) 20 MEQ ER tablet TAKE 1/2 (ONE-HALF)  BY MOUTH ONCE DAILY    ? tamsulosin (FLOMAX) 0.4 mg capsule 1 capsule    ? ?No current facility-administered medications on file prior to visit.  ? ? ?Family History  ?Problem Relation Age of Onset  ? Heart valve disease Father   ? High blood pressure (Hypertension) Sister   ?  ? ?Social History  ? ?Tobacco Use  ?Smoking Status Former  ? Types: Cigarettes  ?Smokeless Tobacco Never  ?  ? ?Social History   ? ?Socioeconomic History  ? Marital status: Married  ?Tobacco Use  ? Smoking status: Former  ?  Types: Cigarettes  ? Smokeless tobacco: Never  ?Vaping Use  ? Vaping Use: Never used  ?Substance and Sexual Activity  ? Alcohol use: Never  ? Drug use: Never  ? ? ?Objective:  ? ? ?Vitals:  ? 01/08/22 0940  ?BP: 118/72  ?Pulse: 56  ?Temp: 36.9 ?C (98.4 ?F)  ?SpO2: 96%  ?Weight: 77.7 kg (171 lb 3.2 oz)  ?Height: 167.6 cm (5\' 6" )  ?  ?Body mass index is 27.63 kg/m?. ? ?Alert, calm, cooperative ?Unlabored respirations ?Abdomen is soft, nontender, nondistended.  There is a large, partially incarcerated right inguinal hernia which is mildly tender but without overlying skin change.  No hernia on the left ? ? ?Assessment and Plan:  ?Diagnoses and all orders for this visit: ? ?Non-recurrent unilateral inguinal hernia without obstruction or gangrene ? ?We had a long discussion today and went over the relevant anatomy and natural history of inguinal hernias as well as options for repair.  Discussed that in the category of ongoing observation/nonsurgical treatment, ongoing episodes of pain incarceration are likely, and there is a risk of strangulation which may merit emergency surgery.  Given episodes of pain and incarceration as well as the size of the hernia and likely containing bowel, I do recommend proceeding with an open repair with mesh.  We discussed the surgery in detail including technical aspects, risks of bleeding, infection, pain, scarring, injury to bowel, bladder, vessels or nerves, postoperative issues with wound healing, seroma/hematoma, chronic pain or numbness, hernia recurrence, as well as cardiovascular/pulmonary/thromboembolic risks.   ?While his risk profile is higher given his medical comorbidities, I do worry that if he winds up in an emergency situation his risk profile will be significantly worse and therefore would recommend he consider repair.  He is reluctant at this time, but he and his wife will  continue to discuss.  He was agreeable to initiating the scheduling process and getting cardiac clearance so we will go ahead and proceed. ? ?Jamilya Sarrazin Raquel James, MD  ?

## 2022-01-20 ENCOUNTER — Ambulatory Visit (INDEPENDENT_AMBULATORY_CARE_PROVIDER_SITE_OTHER): Payer: PPO

## 2022-01-20 DIAGNOSIS — I4891 Unspecified atrial fibrillation: Secondary | ICD-10-CM

## 2022-01-20 LAB — CUP PACEART REMOTE DEVICE CHECK
Battery Remaining Longevity: 142 mo
Battery Voltage: 3.13 V
Brady Statistic AP VP Percent: 77.53 %
Brady Statistic AP VS Percent: 0.57 %
Brady Statistic AS VP Percent: 20.76 %
Brady Statistic AS VS Percent: 1.14 %
Brady Statistic RA Percent Paced: 78.59 %
Brady Statistic RV Percent Paced: 98.29 %
Date Time Interrogation Session: 20230328054057
Implantable Lead Implant Date: 20220922
Implantable Lead Implant Date: 20220922
Implantable Lead Location: 753859
Implantable Lead Location: 753860
Implantable Lead Model: 3830
Implantable Lead Model: 5076
Implantable Pulse Generator Implant Date: 20220922
Lead Channel Impedance Value: 323 Ohm
Lead Channel Impedance Value: 323 Ohm
Lead Channel Impedance Value: 475 Ohm
Lead Channel Impedance Value: 494 Ohm
Lead Channel Pacing Threshold Amplitude: 0.75 V
Lead Channel Pacing Threshold Amplitude: 0.875 V
Lead Channel Pacing Threshold Pulse Width: 0.4 ms
Lead Channel Pacing Threshold Pulse Width: 0.4 ms
Lead Channel Sensing Intrinsic Amplitude: 15.75 mV
Lead Channel Sensing Intrinsic Amplitude: 15.75 mV
Lead Channel Sensing Intrinsic Amplitude: 3.875 mV
Lead Channel Sensing Intrinsic Amplitude: 3.875 mV
Lead Channel Setting Pacing Amplitude: 1.5 V
Lead Channel Setting Pacing Amplitude: 2 V
Lead Channel Setting Pacing Pulse Width: 0.4 ms
Lead Channel Setting Sensing Sensitivity: 0.9 mV

## 2022-01-21 ENCOUNTER — Telehealth: Payer: Self-pay | Admitting: *Deleted

## 2022-01-21 ENCOUNTER — Telehealth: Payer: Self-pay | Admitting: Internal Medicine

## 2022-01-21 NOTE — Telephone Encounter (Signed)
Just received Medical Clearance today. ? ? ? ?Pre-operative Risk Assessment  ?  ?Patient Name: [name] [name]  ?DOB: [birth year] ?MRN: [id number]  ? ?  ? ?Request for Surgical Clearance   ? ?Procedure:   Hernia Surgery ? ?Date of Surgery:  Clearance TBD                              ?   ?Surgeon:  Phylliss Blakes, MD. ?Surgeon's Group or Practice Name:  [facility] ?Phone number:  [phone] ?Fax number:  [phone] Charmella Little, CNA ?  ?Type of Clearance Requested:   ?- Medical  ?- Pharmacy:  Hold Apixaban (Eliquis) Need instructions as how patient should HOLD medication preoperatively. ?  ?Type of Anesthesia:  General  ?  ?Additional requests/questions:   ? ?Signed, ?Dalyn Becker   ?01/21/2022, 10:51 AM  ? ?

## 2022-01-21 NOTE — Telephone Encounter (Signed)
Returned call to Charmella to let her know that we have not received a clearance request for this patient. She will resend now. ?

## 2022-01-21 NOTE — Telephone Encounter (Signed)
Patient with diagnosis of afib on Eliquis for anticoagulation.   ? ?Procedure: hernia surgery ?Date of procedure: TBD ? ?CHA2DS2-VASc Score = 5  ?This indicates a 7.2% annual risk of stroke. ?The patient's score is based upon: ?CHF History: 0 ?HTN History: 1 ?Diabetes History: 1 ?Stroke History: 0 ?Vascular Disease History: 1 ?Age Score: 2 ?Gender Score: 0 ?  ?CrCl 63mL/min ?Platelet count 218K ? ?Per office protocol, patient can hold Eliquis for 2-3 days prior to procedure.   ?

## 2022-01-21 NOTE — Telephone Encounter (Signed)
Spoke with patient and scheduled him for a telehealth visit for tomorrow, 01/22/22 at 11:00 AM. ?

## 2022-01-21 NOTE — Telephone Encounter (Signed)
Clinical pharmacist to review Eliquis 

## 2022-01-21 NOTE — Telephone Encounter (Signed)
I do not see a preop clearance in our system. Please call the requesting office and collect more info ?

## 2022-01-21 NOTE — Telephone Encounter (Signed)
?  Patient Consent for Virtual Visit  ? ? ?   ? ?[name] [name] has provided verbal consent on 01/21/2022 for a virtual visit (video or telephone). ? ? ?CONSENT FOR VIRTUAL VISIT FOR:  [name] [name]  ?By participating in this virtual visit I agree to the following: ? ?I hereby voluntarily request, consent and authorize [facility] and its employed or contracted physicians, physician assistants, nurse practitioners or other licensed health care professionals (the Practitioner), to provide me with telemedicine health care services (the ?Services") as deemed necessary by the treating Practitioner. I acknowledge and consent to receive the Services by the Practitioner via telemedicine. I understand that the telemedicine visit will involve communicating with the Practitioner through live audiovisual communication technology and the disclosure of certain medical information by electronic transmission. I acknowledge that I have been given the opportunity to request an in-person assessment or other available alternative prior to the telemedicine visit and am voluntarily participating in the telemedicine visit. ? ?I understand that I have the right to withhold or withdraw my consent to the use of telemedicine in the course of my care at any time, without affecting my right to future care or treatment, and that the Practitioner or I may terminate the telemedicine visit at any time. I understand that I have the right to inspect all information obtained and/or recorded in the course of the telemedicine visit and may receive copies of available information for a reasonable fee.  I understand that some of the potential risks of receiving the Services via telemedicine include:  ?Delay or interruption in medical evaluation due to technological equipment failure or disruption; ?Information transmitted may not be sufficient (e.g. poor resolution of images) to allow for appropriate medical decision making by the Practitioner; and/or   ?In rare instances, security protocols could fail, causing a breach of personal health information. ? ?Furthermore, I acknowledge that it is my responsibility to provide information about my medical history, conditions and care that is complete and accurate to the best of my ability. I acknowledge that Practitioner's advice, recommendations, and/or decision may be based on factors not within their control, such as incomplete or inaccurate data provided by me or distortions of diagnostic images or specimens that may result from electronic transmissions. I understand that the practice of medicine is not an exact science and that Practitioner makes no warranties or guarantees regarding treatment outcomes. I acknowledge that a copy of this consent can be made available to me via my patient portal ([facility]), or I can request a printed copy by calling the office of [facility].   ? ?I understand that my insurance will be billed for this visit.  ? ?I have read or had this consent read to me. ?I understand the contents of this consent, which adequately explains the benefits and risks of the Services being provided via telemedicine.  ?I have been provided ample opportunity to ask questions regarding this consent and the Services and have had my questions answered to my satisfaction. ?I give my informed consent for the services to be provided through the use of telemedicine in my medical care ? ?  ?

## 2022-01-21 NOTE — Telephone Encounter (Signed)
Please arrange virtual telephone visit to discuss cardiac clearance ?

## 2022-01-21 NOTE — Telephone Encounter (Signed)
Follow Up: ? ? ?Charmella from Dr Derrill Memo office called. She is checking on the status of the clearance that was sent on 01-08-22. ?

## 2022-01-22 ENCOUNTER — Ambulatory Visit (INDEPENDENT_AMBULATORY_CARE_PROVIDER_SITE_OTHER): Payer: PPO | Admitting: General Practice

## 2022-01-22 DIAGNOSIS — Z01810 Encounter for preprocedural cardiovascular examination: Secondary | ICD-10-CM | POA: Diagnosis not present

## 2022-01-22 NOTE — Progress Notes (Signed)
? ?Virtual Visit via Telephone Note  ? ?This visit type was conducted due to national recommendations for restrictions regarding the COVID-19 Pandemic (e.g. social distancing) in an effort to limit this patient's exposure and mitigate transmission in our community.  Due to his co-morbid illnesses, this patient is at least at moderate risk for complications without adequate follow up.  This format is felt to be most appropriate for this patient at this time.  The patient did not have access to video technology/had technical difficulties with video requiring transitioning to audio format only (telephone).  All issues noted in this document were discussed and addressed.  No physical exam could be performed with this format.  Please refer to the patient's chart for his  consent to telehealth for [facility]. ? ?Evaluation Performed:  Preoperative cardiovascular risk assessment ?_____________  ? ?Date:  01/22/2022  ? ?Patient ID:  [name] [name], [name] [name] 06-10-1941, MRN [id number] ?Patient Location:  ?Home ?Provider location:   ?Office ? ?Primary Care Provider:  Seward Carol, MD ?Primary Cardiologist:  Sinclair Grooms, MD ? ?Chief Complaint  ?  ?81 y.o. y/o male with a h/o syncope, coronary artery disease status post CABG, dyslipidemia, hypertension, atrial fibrillation, complete heart block status post PPM, who is pending earlier surgery, and presents today for telephonic preoperative cardiovascular risk assessment. ? ?Past Medical History  ?  ?Past Medical History:  ?Diagnosis Date  ? Benign prostatic hypertrophy with lower urinary tract symptoms (LUTS)   ? Bilateral edema of lower extremity   ? IMPROVED W/ TED HOSE  ? CAD (coronary artery disease)   ? cardiologist-  [name]--  s/p  cabg x6 in 1998  ? Heart murmur   ? Hiatal hernia   ? History of kidney stones   ? Hydronephrosis, left   ? Hyperlipidemia   ? Hypertension   ? Hypothyroidism   ? Left ureteral stone   ? Memory difficulties   ? Nephrolithiasis   ?  right side per ct  non-obstructive  ? PVD (peripheral vascular disease) ([place])   ? RBBB (right bundle branch block)   ? S/P CABG x 6   ? 1998  ? Sigmoid diverticulosis   ? Sinus bradycardia   ? Type 2 diabetes mellitus ([place])   ? ?Past Surgical History:  ?Procedure Laterality Date  ? CATARACT EXTRACTION W/ INTRAOCULAR LENS  IMPLANT, BILATERAL  2011  ? CORONARY ARTERY BYPASS GRAFT  1998  ? CABG X 6 --   LIMA  to the LAD and  other undefined SVG's  ? CYSTOSCOPY WITH BIOPSY N/A 11/29/2015  ? Procedure: CYSTOSCOPY WITH BIOPSY AND FULGERATION;  Surgeon: [name], MD;  Location: [facility];  Service: Urology;  Laterality: N/A;  ? CYSTOSCOPY/RETROGRADE/URETEROSCOPY/STONE EXTRACTION WITH BASKET Left 11/29/2015  ? Procedure: CYSTOSCOPY/RETROGRADE/DIAGNOSTIC URETEROSCOPY/STONE EXTRACTION;  Surgeon: [name], MD;  Location: [facility];  Service: Urology;  Laterality: Left;  ? EXTRACORPOREAL SHOCK WAVE LITHOTRIPSY Left 08-12-2015  ? LEFT HEART CATHETERIZATION WITH CORONARY/GRAFT ANGIOGRAM  06/16/2013  ? Procedure: LEFT HEART CATHETERIZATION WITH [name];  Surgeon: Sinclair Grooms, MD;  Location: [facility] CATH LAB;  Service: Cardiovascular; Native vessel occlusive disease w/ ostial total occlusion RCA, dLM, & heavy calicification LM LAD CX RCA/  SVG to diagonal patent, SVG to marginal 1 patent & 2 occluded, LIMA to dLAD patent/ dCFX collateral from obtuse marginal graft/  normal LVF  ? PACEMAKER IMPLANT N/A 07/17/2021  ? Procedure: PACEMAKER IMPLANT;  Surgeon: Cristopher Peru  W, MD;  Location: [place] CV LAB;  Service: Cardiovascular;  Laterality: N/A;  ? TRANSTHORACIC ECHOCARDIOGRAM  05-23-2015  [name]  ? mild LVH,  grade 1 diastolic dysfunction,  ef 55-060%,  mild AV calcifacation without stenosis,  mild AR and MR,  moderate LAE,  trivial PR and TR,  mild increase pulmonary pressure  ? ? ?Allergies ? ?Allergies  ?Allergen Reactions  ? Spironolactone Rash  ? Penicillins  Rash  ?  Has patient had a PCN reaction causing immediate rash, facial/tongue/throat swelling, SOB or lightheadedness with hypotension:no ?Has patient had a PCN reaction causing severe rash involving mucus membranes or skin necrosis: no ?Has patient had a PCN reaction that required hospitalization no ?Has patient had a PCN reaction occurring within the last 10 years: no ?If all of the above answers are "NO", then may proceed with Cephalosporin use. ?  ? ? ?History of Present Illness  ?  ?[name] [name] is a 81 y.o. male who presents via audio/video conferencing for a telehealth visit today.  Pt was last seen in cardiology clinic on 11/24/2021 by Dr. Lovena Le.  At that time [name] [name] was doing well .  The patient is now pending hernia surgery.  Since his last visit, he remains stable from a cardiac standpoint. ? ?Today he denies chest pain, shortness of breath, lower extremity edema, fatigue, palpitations, melena, hematuria, hemoptysis, diaphoresis, weakness, presyncope, syncope, orthopnea, and PND. ? ? ? ?Home Medications  ?  ?Prior to Admission medications   ?Medication Sig Start Date End Date Taking? Authorizing Provider  ?acetaminophen (TYLENOL) 325 MG tablet Take 1-2 tablets (325-650 mg total) by mouth every 4 (four) hours as needed for mild pain. 07/17/21   Swayze, Ava, DO  ?apixaban (ELIQUIS) 5 MG TABS tablet Start taking 07/22/21 in the morning 1 tablet (5 mg total) by mouth 2 (two) times daily. 07/17/21   Baldwin Jamaica, PA-C  ?atorvastatin (LIPITOR) 80 MG tablet Take 40 mg by mouth every morning.     [provider]  ?Cyanocobalamin 1000 MCG/ML KIT Inject 1,000 mcg as directed every 30 (thirty) days.    [provider]  ?finasteride (PROSCAR) 5 MG tablet Take 5 mg by mouth every morning.     [provider]  ?Flaxseed, Linseed, (FLAX SEEDS PO) Take 1 capsule by mouth daily.    [provider]  ?hydrochlorothiazide (HYDRODIURIL) 25 MG tablet Take 25 mg by mouth every  morning.     [provider]  ?levothyroxine (SYNTHROID, LEVOTHROID) 100 MCG tablet Take 100 mcg by mouth daily before breakfast. 05/03/17   [provider]  ?memantine (NAMENDA) 10 MG tablet Take 1 tablet (10 mg total) by mouth 2 (two) times daily. 06/09/21   Penumalli, Earlean Polka, MD  ?metFORMIN (GLUCOPHAGE) 500 MG tablet Take 500 mg by mouth daily.     [provider]  ?metoprolol succinate (TOPROL-XL) 25 MG 24 hr tablet TAKE 1 TABLET BY MOUTH ONCE DAILY. PATIENT MUST KEEP UPCOMING APPOINTMENT IN JAN 2023 BEFORE ANYMORE REFILLS 12/03/21   Belva Crome, MD  ?nitroGLYCERIN (NITROSTAT) 0.4 MG SL tablet DISSOLVE ONE TABLET UNDER THE TONGUE EVERY 5 MINUTES AS NEEDED FOR CHEST PAIN 06/29/17   Belva Crome, MD  ?potassium chloride SA (K-DUR,KLOR-CON) 20 MEQ tablet Take 10 mEq by mouth every morning.     [provider]  ?tamsulosin (FLOMAX) 0.4 MG CAPS capsule Take 0.4 mg by mouth daily. 03/09/16   [provider]  ? ? ?  Physical Exam  ?  ?Vital Signs:  [name] [name] does not have vital signs available for review today. ? ?Given telephonic nature of communication, physical exam is limited. ?AAOx3. NAD. Normal affect.  Speech and respirations are unlabored. ? ?Accessory Clinical Findings  ?  ?None ? ?Assessment & Plan  ?  ?1.  Preoperative Cardiovascular Risk Assessment: ? ? ?  ? ?Primary Cardiologist: Sinclair Grooms, MD ? ?Chart reviewed as part of pre-operative protocol coverage. Given past medical history and time since last visit, based on ACC/AHA guidelines, [name] [name] would be at acceptable risk for the planned procedure without further cardiovascular testing.  ? ?Patient was advised that if he develops new symptoms prior to surgery to contact our office to arrange a follow-up appointment.  He verbalized understanding. ? ?His RCRI is a class III risk, 6.6% risk of major cardiac event.  He is able to complete greater than 4 METS of physical activity. ? ?Patient with  diagnosis of afib on Eliquis for anticoagulation.   ?  ?Procedure: hernia surgery ?Date of procedure: TBD ?  ?CHA2DS2-VASc Score = 5  ?This indicates a 7.2% annual risk of stroke. ?The patient's score is bas

## 2022-01-29 ENCOUNTER — Encounter (HOSPITAL_COMMUNITY)
Admission: RE | Admit: 2022-01-29 | Discharge: 2022-01-29 | Disposition: A | Discharge status: Home / Self Care | Payer: PPO | Source: Ambulatory Visit | Attending: Surgery | Admitting: Surgery

## 2022-01-29 ENCOUNTER — Encounter (HOSPITAL_COMMUNITY): Payer: Self-pay

## 2022-01-29 ENCOUNTER — Encounter: Payer: Self-pay | Admitting: Internal Medicine

## 2022-01-29 ENCOUNTER — Other Ambulatory Visit: Payer: Self-pay

## 2022-01-29 VITALS — BP 160/77 | HR 59 | Temp 97.8°F | Resp 18 | Ht 66.0 in | Wt 170.0 lb

## 2022-01-29 DIAGNOSIS — Z951 Presence of aortocoronary bypass graft: Secondary | ICD-10-CM | POA: Insufficient documentation

## 2022-01-29 DIAGNOSIS — Z01812 Encounter for preprocedural laboratory examination: Secondary | ICD-10-CM | POA: Insufficient documentation

## 2022-01-29 DIAGNOSIS — K4030 Unilateral inguinal hernia, with obstruction, without gangrene, not specified as recurrent: Secondary | ICD-10-CM | POA: Insufficient documentation

## 2022-01-29 DIAGNOSIS — I2510 Atherosclerotic heart disease of native coronary artery without angina pectoris: Secondary | ICD-10-CM | POA: Diagnosis not present

## 2022-01-29 DIAGNOSIS — I10 Essential (primary) hypertension: Secondary | ICD-10-CM | POA: Diagnosis not present

## 2022-01-29 DIAGNOSIS — I4891 Unspecified atrial fibrillation: Secondary | ICD-10-CM | POA: Insufficient documentation

## 2022-01-29 DIAGNOSIS — Z8674 Personal history of sudden cardiac arrest: Secondary | ICD-10-CM | POA: Insufficient documentation

## 2022-01-29 DIAGNOSIS — E1151 Type 2 diabetes mellitus with diabetic peripheral angiopathy without gangrene: Secondary | ICD-10-CM | POA: Diagnosis not present

## 2022-01-29 DIAGNOSIS — Z950 Presence of cardiac pacemaker: Secondary | ICD-10-CM | POA: Diagnosis not present

## 2022-01-29 LAB — CBC
HCT: 37.2 % — ABNORMAL LOW (ref 39.0–52.0)
Hemoglobin: 12.7 g/dL — ABNORMAL LOW (ref 13.0–17.0)
MCH: 30.5 pg (ref 26.0–34.0)
MCHC: 34.1 g/dL (ref 30.0–36.0)
MCV: 89.2 fL (ref 80.0–100.0)
Platelets: 212 10*3/uL (ref 150–400)
RBC: 4.17 MIL/uL — ABNORMAL LOW (ref 4.22–5.81)
RDW: 14.5 % (ref 11.5–15.5)
WBC: 6.5 10*3/uL (ref 4.0–10.5)
nRBC: 0 % (ref 0.0–0.2)

## 2022-01-29 LAB — BASIC METABOLIC PANEL
Anion gap: 4 — ABNORMAL LOW (ref 5–15)
BUN: 23 mg/dL (ref 8–23)
CO2: 28 mmol/L (ref 22–32)
Calcium: 9.6 mg/dL (ref 8.9–10.3)
Chloride: 107 mmol/L (ref 98–111)
Creatinine, Ser: 0.97 mg/dL (ref 0.61–1.24)
GFR, Estimated: >60 mL/min (ref >60)
Glucose, Bld: 101 mg/dL — ABNORMAL HIGH (ref 70–99)
Potassium: 3.9 mmol/L (ref 3.5–5.1)
Sodium: 139 mmol/L (ref 135–145)

## 2022-01-29 LAB — HEMOGLOBIN A1C
Hgb A1c MFr Bld: 5.6 % (ref 4.8–5.6)
Mean Plasma Glucose: 114.02 mg/dL

## 2022-01-29 LAB — GLUCOSE, CAPILLARY: Glucose-Capillary: 106 mg/dL — ABNORMAL HIGH (ref 70–99)

## 2022-01-29 NOTE — Progress Notes (Signed)
PERIOPERATIVE PRESCRIPTION FOR IMPLANTED CARDIAC DEVICE PROGRAMMING ? ?Patient Information: ?Name:  [name] [name]  ?DOB:  Mar 15, 1941  ?MRN:  [id number]  ?  ?Planned Procedure:  Rt open inguinal hernia repair  ?Surgeon:  Dr. Phylliss Blakes  ?Date of Procedure:  02/03/22  ?Cautery will be used.  ?Position during surgery:    ? ?Please send documentation back to:  ?[facility] (Fax # [phone])  ? ?Vonzella Nipple, RN  ?01/29/2022 2:49 PM  ?Device Information: ? ?Clinic EP Physician:   Lewayne Bunting, MD ? ?Device Type:  Pacemaker ?Manufacturer and Phone #:  Medtronic: [phone] ?Pacemaker Dependent?:  No. ?Date of Last Device Check:  11/24/21 in clinic, 01/20/22 remote Normal Device Function?:  Yes.   ? ?Electrophysiologist's Recommendations: ? ?Have magnet available. ?Provide continuous ECG monitoring when magnet is used or reprogramming is to be performed.  ?Procedure should not interfere with device function.  No device programming or magnet placement needed. ? ?Per Device Clinic Standing Orders, ?Tina Griffiths, RN  ?4:35 PM 01/29/2022  ?

## 2022-01-29 NOTE — Progress Notes (Signed)
Anesthesia note: ? ?Bowel prep reminder:NA ? ?PCP - Dr. Carney Living ?Cardiologist -Dr. Linard Millers ?Other-  ? ?Chest x-ray - 07/17/21-epic ?EKG - 11/24/21-epic ?Stress Test - no ?ECHO - 07/17/21-epic ?Cardiac Cath -  ?GBAG x [id number], heart cath 2014, PPM implant 2022 ?Pacemaker/ICD device last checked:01/20/22 ? ?Sleep Study - no ?CPAP -  ? ? ?Fasting Blood Sugar - Pt doesn't check and he doesn't know his AM range of blood sugars ?Checks Blood Sugar _0____ ? ?Blood Thinner:Eliquis/ Dr. Tamala Julian ?Blood Thinner Instructions:Hold for 4 days prior to DOS/ Dr.Conner ?Aspirin Instructions: ?Last Dose:01/29/22 ? ?Anesthesia review: yes ? ?Patient denies shortness of breath, fever, cough and chest pain at PAT appointment ?Pt moves slowly. He has some memory deficits and his wife helps him. ? ?Patient verbalized understanding of instructions that were given to them at the PAT appointment. Patient was also instructed that they will need to review over the PAT instructions again at home before surgery. Yes.  ?

## 2022-01-29 NOTE — Patient Instructions (Addendum)
2 VISITORS  ARE ALLOWED TO COME WITH YOU AND STAY IN THE WAITING ROOM ONLY DURING PRE OP AND PROCEDURE.   ?**NO VISITORS ARE ALLOWED IN THE SHORT STAY AREA OR RECOVERY ROOM!!** ? ? ? ? Your procedure is scheduled on: 02/03/22 ? ? Report to [facility] Main Entrance ? ?  Report to admitting at  1:00 PM ? ? Call this number if you have problems the morning of surgery [phone] ? ? Do not eat food :After Midnight. ? ? After Midnight you may have the following liquids until __12:00__ PM DAY OF SURGERY ? ?Water ?Black Coffee (sugar ok, NO MILK/CREAM OR CREAMERS)  ?Tea (sugar ok, NO MILK/CREAM OR CREAMERS) regular and decaf                             ?Plain Jell-O (NO RED)                                           ?Fruit ices (not with fruit pulp, NO RED)                                     ?Popsicles (NO RED)                                                                  ?Juice: apple, WHITE grape, WHITE cranberry ?Sports drinks like Gatorade (NO RED) ?Clear broth(vegetable,chicken,beef) ? ?       If you have questions, please contact your surgeon?s office. ? ? ?  ?  ?Oral Hygiene is also important to reduce your risk of infection.                                    ?Remember - BRUSH YOUR TEETH THE MORNING OF SURGERY WITH YOUR REGULAR TOOTHPASTE ? ? Do NOT smoke after Midnight ? ? Take these medicines the morning of surgery with A SIP OF WATER: Metoprolol,Levothyroxine,Finasteride, Memantine ?Stop Eliquis 4 days prior to DOS. Last dose 01/29/22 ? ?DO NOT TAKE ANY ORAL DIABETIC MEDICATIONS DAY OF YOUR SURGERY( Metformin) ? ?                  ?           You may not have any metal on your body including , jewelry, and body piercing ? ?           Do not wearlotions, powders, cologne, or deodorant ? ? ? ?            Men may shave face and neck. ? ? Do not bring valuables to the hospital. [place] IS NOT ?            RESPONSIBLE   FOR VALUABLES. ? ? Contacts, dentures or bridgework may not be worn into  surgery. ? ?  ? Patients discharged on the day of surgery will not be allowed to drive home.  Someone NEEDS  to stay with you for the first 24 hours after anesthesia. ? ? Special Instructions: Bring a copy of your healthcare power of attorney and living will documents the day of surgery if you haven't scanned them before. ? ?            Please read over the following fact sheets you were given: IF YOU HAVE QUESTIONS ABOUT YOUR PRE-OP INSTRUCTIONS PLEASE CALL [phone] ? ?   [place] - Preparing for Surgery ?Before surgery, you can play an important role.  Because skin is not sterile, your skin needs to be as free of germs as possible.  You can reduce the number of germs on your skin by washing with CHG (chlorahexidine gluconate) soap before surgery.  CHG is an antiseptic cleaner which kills germs and bonds with the skin to continue killing germs even after washing. ?Please DO NOT use if you have an allergy to CHG or antibacterial soaps.  If your skin becomes reddened/irritated stop using the CHG and inform your nurse when you arrive at Short Stay.  You may shave your face/neck. ?Please follow these instructions carefully: ? 1.  Shower with CHG Soap the night before surgery and the  morning of Surgery. ? 2.  If you choose to wash your hair, wash your hair first as usual with your  normal  shampoo. ? 3.  After you shampoo, rinse your hair and body thoroughly to remove the  shampoo.                       ?     4.  Use CHG as you would any other liquid soap.  You can apply chg directly  to the skin and wash  ?                     Gently with a scrungie or clean washcloth. ? 5.  Apply the CHG Soap to your body ONLY FROM THE NECK DOWN.   Do not use on face/ open      ?                     Wound or open sores. Avoid contact with eyes, ears mouth and genitals (private parts).  ?                     Engineering geologist,  Genitals (private parts) with your normal soap. ?            6.  Wash thoroughly, paying special attention to  the area where your surgery  will be performed. ? 7.  Thoroughly rinse your body with warm water from the neck down. ? 8.  DO NOT shower/wash with your normal soap after using and rinsing off  the CHG Soap. ?               9.  Pat yourself dry with a clean towel. ?           10.  Wear clean pajamas. ?           11.  Place clean sheets on your bed the night of your first shower and do not  sleep with pets. ?Day of Surgery : ?Do not apply any lotions/deodorants the morning of surgery.  Please wear clean clothes to the hospital/surgery center. ? ?FAILURE TO FOLLOW THESE INSTRUCTIONS MAY RESULT IN THE CANCELLATION OF YOUR SURGERY ? ? ? ?________________________________________________________________________  ?

## 2022-02-02 NOTE — Progress Notes (Signed)
Anesthesia Chart Review: ? ? Case: [id number] Date/Time: 02/03/22 1500  ? Procedure: OPEN RIGHT INGUINAL HERNIA REPAIR WITH MESH (Right)  ? Anesthesia type: General  ? Pre-op diagnosis: INCARCERATED INGUINAL HERNIA  ? Location: [facility] ROOM 02 / [facility]  ? Surgeons: [name], MD  ? ?  ? ? ?DISCUSSION: ?Pt is 81 years old with hx CAD (s/p CABG x6 1998), HTN, atrial fibrillation, complete heart block (s/p Medtronic pacemaker implanted 07/17/21), carotid artery disease, PAD, DM ? ?Last dose eliquis 01/29/22 ? ? ?Perioperative prescription for pacemaker notes:  ?Have magnet available. ?Provide continuous ECG monitoring when magnet is used or reprogramming is to be performed.  ?Procedure should not interfere with device function.  No device programming or magnet placement needed. ? ? ?VS: BP (!) 160/77   Pulse (!) 59   Temp 36.6 ?C (Oral)   Resp 18   Ht '5\' 6"'  (1.676 m)   Wt 77.1 kg   SpO2 96%   BMI 27.44 kg/m?  ? ?PROVIDERS: ?- PCP is Seward Carol, MD ?- Cardiologist is Daneen Schick, MD. Cleared for surgery at acceptable risk at last virtual office visit 01/22/22 with Coletta Memos, NP. Last in person visit 10/29/21 ?- EP is Cristopher Peru, MD. Last office visit 11/24/21 ?- Neurologist is Andrey Spearman, MD. Last office visit 06/09/21 for memory loss ? ?LABS: Labs reviewed: Acceptable for surgery. ?(all labs ordered are listed, but only abnormal results are displayed) ? ?Labs Reviewed  ?BASIC METABOLIC PANEL - Abnormal; Notable for the following components:  ?    Result Value  ? Glucose, Bld 101 (*)   ? Anion gap 4 (*)   ? All other components within normal limits  ?CBC - Abnormal; Notable for the following components:  ? RBC 4.17 (*)   ? Hemoglobin 12.7 (*)   ? HCT 37.2 (*)   ? All other components within normal limits  ?GLUCOSE, CAPILLARY - Abnormal; Notable for the following components:  ? Glucose-Capillary 106 (*)   ? All other components within normal limits  ?HEMOGLOBIN A1C  ? ? ? ?IMAGES: ?CXR 07/17/21:  ?1. New  left-sided pacemaker.  No pneumothorax. ?2. Stable cardiomegaly. ? ?EKG 11/24/21: nsr with ventricular pacing ? ? ?CV: ?Echo 07/16/21:  ?1. Left ventricular ejection fraction, by estimation, is 50 to 55%. The left ventricle has low normal function. The left ventricle has no regional wall motion abnormalities. There is mild left ventricular hypertrophy. Left ventricular diastolic parameters are indeterminate.  ?2. Right ventricular systolic function is normal. The right ventricular size is normal. Tricuspid regurgitation signal is inadequate for assessing PA pressure.  ?3. Left atrial size was moderately dilated.  ?4. Right atrial size was mildly dilated.  ?5. The mitral valve is grossly normal. Mild mitral valve regurgitation. No evidence of mitral stenosis.  ?6. The aortic valve is grossly normal. There is moderate calcification of the aortic valve. Aortic valve regurgitation is trivial. Mild aortic valve sclerosis is present, with no evidence of aortic valve stenosis.  ?7. Aortic dilatation noted. There is mild dilatation of the aortic root, measuring 41 mm.  ? ?Carotid US 04/23/21:  ?- Right Carotid: Velocities in the right ICA are consistent with a 1-39% stenosis.  ?- Left Carotid: Velocities in the left ICA are consistent with a 40-59% stenosis.  ? ? ? ?Past Medical History:  ?Diagnosis Date  ? Benign prostatic hypertrophy with lower urinary tract symptoms (LUTS)   ? Bilateral edema of lower extremity   ? IMPROVED W/  TED HOSE  ? CAD (coronary artery disease)   ? cardiologist-  [name]--  s/p  cabg x6 in 1998  ? Heart murmur   ? Hiatal hernia   ? History of kidney stones   ? Hydronephrosis, left   ? Hyperlipidemia   ? Hypertension   ? Hypothyroidism   ? Left ureteral stone   ? Memory difficulties   ? Nephrolithiasis   ? right side per ct  non-obstructive  ? PVD (peripheral vascular disease) ([place])   ? RBBB (right bundle branch block)   ? S/P CABG x 6   ? 1998  ? Sigmoid diverticulosis   ? Sinus bradycardia    ? Type 2 diabetes mellitus ([place])   ? ? ?Past Surgical History:  ?Procedure Laterality Date  ? CATARACT EXTRACTION W/ INTRAOCULAR LENS  IMPLANT, BILATERAL  2011  ? CORONARY ARTERY BYPASS GRAFT  1998  ? CABG X 6 --   LIMA  to the LAD and  other undefined SVG's  ? CYSTOSCOPY WITH BIOPSY N/A 11/29/2015  ? Procedure: CYSTOSCOPY WITH BIOPSY AND FULGERATION;  Surgeon: [name], MD;  Location: [facility];  Service: Urology;  Laterality: N/A;  ? CYSTOSCOPY/RETROGRADE/URETEROSCOPY/STONE EXTRACTION WITH BASKET Left 11/29/2015  ? Procedure: CYSTOSCOPY/RETROGRADE/DIAGNOSTIC URETEROSCOPY/STONE EXTRACTION;  Surgeon: [name], MD;  Location: [facility];  Service: Urology;  Laterality: Left;  ? EXTRACORPOREAL SHOCK WAVE LITHOTRIPSY Left 08-12-2015  ? LEFT HEART CATHETERIZATION WITH CORONARY/GRAFT ANGIOGRAM  06/16/2013  ? Procedure: LEFT HEART CATHETERIZATION WITH [name];  Surgeon: Sinclair Grooms, MD;  [facility] C[facility] LAB;  Service: Cardiovascular; Native vessel occlusive disease w/ ostial total occlusion RCA, dLM, & heavy calicification LM LAD CX RCA/  SVG to diagonal patent, SVG to marginal 1 patent & 2 occluded, LIMA to dLAD patent/ dCFX collateral from obtuse marginal graft/  normal LVF  ? PACEMAKER IMPLANT N/A 07/17/2021  ? Procedure: PACEMAKER IMPLANT;  Surgeon: [name], MD;  Location: [place] CV LAB;  Service: Cardiovascular;  Laterality: N/A;  ? TRANSTHORACIC ECHOCARDIOGRAM  05-23-2015  [name]  ? mild LVH,  grade 1 diastolic dysfunction,  ef 55-060%,  mild AV calcifacation without stenosis,  mild AR and MR,  moderate LAE,  trivial PR and TR,  mild increase pulmonary pressure  ? ? ?MEDICATIONS: ? acetaminophen (TYLENOL) 325 MG tablet  ? apixaban (ELIQUIS) 5 MG TABS tablet  ? atorvastatin (LIPITOR) 80 MG tablet  ? Cyanocobalamin 1000 MCG/ML KIT  ? finasteride (PROSCAR) 5 MG tablet  ? Flaxseed, Linseed, (FLAX SEEDS PO)  ? hydrochlorothiazide  (HYDRODIURIL) 25 MG tablet  ? levothyroxine (SYNTHROID, LEVOTHROID) 100 MCG tablet  ? memantine (NAMENDA) 10 MG tablet  ? metFORMIN (GLUCOPHAGE) 500 MG tablet  ? metoprolol succinate (TOPROL-XL) 25 MG 24 hr tablet  ? nitroGLYCERIN (NITROSTAT) 0.4 MG SL tablet  ? potassium chloride SA (K-DUR,KLOR-CON) 20 MEQ tablet  ? tamsulosin (FLOMAX) 0.4 MG CAPS capsule  ? ?No current facility-administered medications for this encounter.  ? ?- Last dose eliquis 01/29/22 ? ? ?If no changes, I anticipate pt can proceed with surgery as scheduled.  ? ?Willeen Cass, PhD, FNP-BC ?[facility]/Anesthesiology ?Phone: [phone] ?02/02/2022 2:21 PM ? ? ? ? ?

## 2022-02-02 NOTE — Progress Notes (Signed)
Remote pacemaker transmission.   

## 2022-02-02 NOTE — Anesthesia Preprocedure Evaluation (Addendum)
Anesthesia Evaluation  ?Patient identified by MRN, date of birth, ID band ?Patient awake ? ? ? ?Reviewed: ?Allergy & Precautions, NPO status , Patient's Chart, lab work & pertinent test results, reviewed documented beta blocker date and time  ? ?Airway ?Mallampati: II ? ?TM Distance: >3 FB ?Neck ROM: Full ? ? ? Dental ? ?(+) Dental Advisory Given, Chipped,  ?  ?Pulmonary ?neg pulmonary ROS, former smoker,  ?  ?Pulmonary exam normal ?breath sounds clear to auscultation ? ? ? ? ? ? Cardiovascular ?hypertension, Pt. on medications and Pt. on home beta blockers ?+ angina + CAD, + CABG and + Peripheral Vascular Disease  ?Normal cardiovascular exam+ dysrhythmias (CHB s/p PPM) + pacemaker  ?Rhythm:Regular Rate:Normal ? ? ?  ?Neuro/Psych ?negative neurological ROS ? negative psych ROS  ? GI/Hepatic ?Neg liver ROS, hiatal hernia,   ?Endo/Other  ?diabetes, Type 2, Oral Hypoglycemic AgentsHypothyroidism  ? Renal/GU ?negative Renal ROS  ?negative genitourinary ?  ?Musculoskeletal ?negative musculoskeletal ROS ?(+)  ? Abdominal ?  ?Peds ? Hematology ? ?(+) Blood dyscrasia (on eliquis, last dose 4/6), ,   ?Anesthesia Other Findings ? ? Reproductive/Obstetrics ? ?  ? ? ? ? ? ? ? ? ? ? ? ? ? ?  ?  ? ? ? ? ? ? ?Anesthesia Physical ?Anesthesia Plan ? ?ASA: 3 ? ?Anesthesia Plan: General  ? ?Post-op Pain Management:   ? ?Induction: Intravenous ? ?PONV Risk Score and Plan: 2 and Dexamethasone, Ondansetron and Treatment may vary due to age or medical condition ? ?Airway Management Planned: Oral ETT ? ?Additional Equipment:  ? ?Intra-op Plan:  ? ?Post-operative Plan: Extubation in OR ? ?Informed Consent: I have reviewed the patients History and Physical, chart, labs and discussed the procedure including the risks, benefits and alternatives for the proposed anesthesia with the patient or authorized representative who has indicated his/her understanding and acceptance.  ? ? ? ?Dental advisory given ? ?Plan  Discussed with: CRNA ? ?Anesthesia Plan Comments: (See APP note by Durel Salts, FNP )  ? ? ? ? ? ?Anesthesia Quick Evaluation ? ?

## 2022-02-03 ENCOUNTER — Ambulatory Visit (HOSPITAL_COMMUNITY): Payer: PPO | Admitting: Emergency Medicine

## 2022-02-03 ENCOUNTER — Ambulatory Visit (HOSPITAL_COMMUNITY)
Admission: RE | Admit: 2022-02-03 | Discharge: 2022-02-03 | Disposition: A | Discharge status: Home / Self Care | Payer: PPO | Attending: Surgery | Admitting: Surgery

## 2022-02-03 ENCOUNTER — Encounter (HOSPITAL_COMMUNITY)
Admission: RE | Disposition: A | Discharge status: Home / Self Care | Payer: Self-pay | Source: Home / Self Care | Attending: Surgery

## 2022-02-03 ENCOUNTER — Other Ambulatory Visit: Payer: Self-pay

## 2022-02-03 ENCOUNTER — Ambulatory Visit (HOSPITAL_BASED_OUTPATIENT_CLINIC_OR_DEPARTMENT_OTHER): Payer: PPO | Admitting: Registered Nurse

## 2022-02-03 ENCOUNTER — Encounter (HOSPITAL_COMMUNITY): Payer: Self-pay | Admitting: Surgery

## 2022-02-03 DIAGNOSIS — K4030 Unilateral inguinal hernia, with obstruction, without gangrene, not specified as recurrent: Secondary | ICD-10-CM

## 2022-02-03 DIAGNOSIS — N400 Enlarged prostate without lower urinary tract symptoms: Secondary | ICD-10-CM | POA: Insufficient documentation

## 2022-02-03 DIAGNOSIS — F0390 Unspecified dementia without behavioral disturbance: Secondary | ICD-10-CM | POA: Diagnosis not present

## 2022-02-03 DIAGNOSIS — E1151 Type 2 diabetes mellitus with diabetic peripheral angiopathy without gangrene: Secondary | ICD-10-CM

## 2022-02-03 DIAGNOSIS — I739 Peripheral vascular disease, unspecified: Secondary | ICD-10-CM | POA: Diagnosis not present

## 2022-02-03 DIAGNOSIS — Z950 Presence of cardiac pacemaker: Secondary | ICD-10-CM | POA: Insufficient documentation

## 2022-02-03 DIAGNOSIS — I10 Essential (primary) hypertension: Secondary | ICD-10-CM

## 2022-02-03 DIAGNOSIS — Z951 Presence of aortocoronary bypass graft: Secondary | ICD-10-CM | POA: Insufficient documentation

## 2022-02-03 DIAGNOSIS — Z7984 Long term (current) use of oral hypoglycemic drugs: Secondary | ICD-10-CM | POA: Insufficient documentation

## 2022-02-03 DIAGNOSIS — K4090 Unilateral inguinal hernia, without obstruction or gangrene, not specified as recurrent: Secondary | ICD-10-CM | POA: Diagnosis not present

## 2022-02-03 DIAGNOSIS — I2510 Atherosclerotic heart disease of native coronary artery without angina pectoris: Secondary | ICD-10-CM | POA: Diagnosis not present

## 2022-02-03 DIAGNOSIS — E119 Type 2 diabetes mellitus without complications: Secondary | ICD-10-CM | POA: Diagnosis not present

## 2022-02-03 DIAGNOSIS — I25119 Atherosclerotic heart disease of native coronary artery with unspecified angina pectoris: Secondary | ICD-10-CM | POA: Diagnosis not present

## 2022-02-03 DIAGNOSIS — Z87891 Personal history of nicotine dependence: Secondary | ICD-10-CM | POA: Diagnosis not present

## 2022-02-03 DIAGNOSIS — Z7901 Long term (current) use of anticoagulants: Secondary | ICD-10-CM | POA: Diagnosis not present

## 2022-02-03 DIAGNOSIS — E039 Hypothyroidism, unspecified: Secondary | ICD-10-CM | POA: Insufficient documentation

## 2022-02-03 HISTORY — PX: INGUINAL HERNIA REPAIR: SHX194

## 2022-02-03 LAB — GLUCOSE, CAPILLARY
Glucose-Capillary: 104 mg/dL — ABNORMAL HIGH (ref 70–99)
Glucose-Capillary: 144 mg/dL — ABNORMAL HIGH (ref 70–99)

## 2022-02-03 SURGERY — REPAIR, HERNIA, INGUINAL, ADULT
Anesthesia: General | Site: Inguinal | Laterality: Right

## 2022-02-03 MED ORDER — BUPIVACAINE-EPINEPHRINE 0.25% -1:200000 IJ SOLN
INTRAMUSCULAR | Status: DC | PRN
  Administered 2022-02-03: 6 mL
  Administered 2022-02-03: 24 mL

## 2022-02-03 MED ORDER — DOCUSATE SODIUM 100 MG PO CAPS: 100.0000 mg | ORAL_CAPSULE | Freq: Two times a day (BID) | ORAL | 0 refills | Status: AC

## 2022-02-03 MED ORDER — ONDANSETRON HCL 4 MG/2ML IJ SOLN
INTRAMUSCULAR | Status: DC | PRN
  Administered 2022-02-03: 4 mg via INTRAVENOUS

## 2022-02-03 MED ORDER — ORAL CARE MOUTH RINSE: 15.0000 mL | Freq: Once | OROMUCOSAL | Status: AC

## 2022-02-03 MED ORDER — DEXAMETHASONE SODIUM PHOSPHATE 10 MG/ML IJ SOLN
INTRAMUSCULAR | Status: AC
  Filled 2022-02-03: qty 1

## 2022-02-03 MED ORDER — ACETAMINOPHEN 325 MG PO TABS: 650.0000 mg | ORAL_TABLET | ORAL | Status: DC | PRN

## 2022-02-03 MED ORDER — OXYCODONE HCL 5 MG PO TABS: 5.0000 mg | ORAL_TABLET | ORAL | Status: DC | PRN

## 2022-02-03 MED ORDER — BUPIVACAINE LIPOSOME 1.3 % IJ SUSP
INTRAMUSCULAR | Status: DC | PRN
Start: 2022-02-03 — End: 2022-02-03
  Administered 2022-02-03: 4 mL
  Administered 2022-02-03: 16 mL

## 2022-02-03 MED ORDER — ROCURONIUM BROMIDE 10 MG/ML (PF) SYRINGE
PREFILLED_SYRINGE | INTRAVENOUS | Status: DC | PRN
  Administered 2022-02-03: 70 mg via INTRAVENOUS

## 2022-02-03 MED ORDER — FENTANYL CITRATE PF 50 MCG/ML IJ SOSY: 25.0000 ug | PREFILLED_SYRINGE | INTRAMUSCULAR | Status: DC | PRN

## 2022-02-03 MED ORDER — PROPOFOL 10 MG/ML IV BOLUS
INTRAVENOUS | Status: DC | PRN
  Administered 2022-02-03: 70 mg via INTRAVENOUS

## 2022-02-03 MED ORDER — FENTANYL CITRATE (PF) 100 MCG/2ML IJ SOLN
INTRAMUSCULAR | Status: AC
  Filled 2022-02-03: qty 2

## 2022-02-03 MED ORDER — ACETAMINOPHEN 500 MG PO TABS
1000.0000 mg | ORAL_TABLET | ORAL | Status: AC
  Administered 2022-02-03: 1000 mg via ORAL
  Filled 2022-02-03: qty 2

## 2022-02-03 MED ORDER — CEFAZOLIN SODIUM-DEXTROSE 2-4 GM/100ML-% IV SOLN
2.0000 g | INTRAVENOUS | Status: AC
  Administered 2022-02-03: 2 g via INTRAVENOUS
  Filled 2022-02-03: qty 100

## 2022-02-03 MED ORDER — 0.9 % SODIUM CHLORIDE (POUR BTL) OPTIME
TOPICAL | Status: DC | PRN
  Administered 2022-02-03: 1000 mL

## 2022-02-03 MED ORDER — CHLORHEXIDINE GLUCONATE 4 % EX LIQD
60.0000 mL | Freq: Once | CUTANEOUS | Status: DC
Start: 2022-02-04 — End: 2022-02-03

## 2022-02-03 MED ORDER — PHENYLEPHRINE 40 MCG/ML (10ML) SYRINGE FOR IV PUSH (FOR BLOOD PRESSURE SUPPORT)
PREFILLED_SYRINGE | INTRAVENOUS | Status: DC | PRN
Start: 2022-02-03 — End: 2022-02-03
  Administered 2022-02-03: 200 ug via INTRAVENOUS
  Administered 2022-02-03: 120 ug via INTRAVENOUS

## 2022-02-03 MED ORDER — ONDANSETRON HCL 4 MG/2ML IJ SOLN
INTRAMUSCULAR | Status: AC
  Filled 2022-02-03: qty 2

## 2022-02-03 MED ORDER — SUGAMMADEX SODIUM 200 MG/2ML IV SOLN
INTRAVENOUS | Status: DC | PRN
Start: 2022-02-03 — End: 2022-02-03
  Administered 2022-02-03: 200 mg via INTRAVENOUS

## 2022-02-03 MED ORDER — ACETAMINOPHEN 650 MG RE SUPP
650.0000 mg | RECTAL | Status: DC | PRN
  Filled 2022-02-03: qty 1

## 2022-02-03 MED ORDER — CHLORHEXIDINE GLUCONATE 0.12 % MT SOLN
15.0000 mL | Freq: Once | OROMUCOSAL | Status: AC
  Administered 2022-02-03: 15 mL via OROMUCOSAL

## 2022-02-03 MED ORDER — SODIUM CHLORIDE 0.9% FLUSH: 3.0000 mL | Freq: Two times a day (BID) | INTRAVENOUS | Status: DC

## 2022-02-03 MED ORDER — CHLORHEXIDINE GLUCONATE 4 % EX LIQD: 60.0000 mL | Freq: Once | CUTANEOUS | Status: DC

## 2022-02-03 MED ORDER — BUPIVACAINE LIPOSOME 1.3 % IJ SUSP
INTRAMUSCULAR | Status: AC
  Filled 2022-02-03: qty 20

## 2022-02-03 MED ORDER — DEXAMETHASONE SODIUM PHOSPHATE 10 MG/ML IJ SOLN
INTRAMUSCULAR | Status: DC | PRN
  Administered 2022-02-03: 10 mg via INTRAVENOUS

## 2022-02-03 MED ORDER — BUPIVACAINE-EPINEPHRINE (PF) 0.25% -1:200000 IJ SOLN
INTRAMUSCULAR | Status: AC
  Filled 2022-02-03: qty 30

## 2022-02-03 MED ORDER — EPHEDRINE SULFATE-NACL 50-0.9 MG/10ML-% IV SOSY
PREFILLED_SYRINGE | INTRAVENOUS | Status: DC | PRN
  Administered 2022-02-03: 10 mg via INTRAVENOUS

## 2022-02-03 MED ORDER — LACTATED RINGERS IV SOLN
INTRAVENOUS | Status: DC
Start: 2022-02-03 — End: 2022-02-03

## 2022-02-03 MED ORDER — TRAMADOL HCL 50 MG PO TABS: 50.0000 mg | ORAL_TABLET | Freq: Four times a day (QID) | ORAL | 0 refills | Status: AC | PRN

## 2022-02-03 MED ORDER — FENTANYL CITRATE (PF) 100 MCG/2ML IJ SOLN
INTRAMUSCULAR | Status: DC | PRN
Start: 2022-02-03 — End: 2022-02-03
  Administered 2022-02-03: 50 ug via INTRAVENOUS

## 2022-02-03 MED ORDER — LIDOCAINE 2% (20 MG/ML) 5 ML SYRINGE
INTRAMUSCULAR | Status: DC | PRN
  Administered 2022-02-03: 60 mg via INTRAVENOUS

## 2022-02-03 MED ORDER — BUPIVACAINE LIPOSOME 1.3 % IJ SUSP: 20.0000 mL | Freq: Once | INTRAMUSCULAR | Status: DC

## 2022-02-03 MED ORDER — LIDOCAINE HCL (PF) 2 % IJ SOLN
INTRAMUSCULAR | Status: AC
  Filled 2022-02-03: qty 5

## 2022-02-03 MED ORDER — SODIUM CHLORIDE 0.9% FLUSH: 3.0000 mL | INTRAVENOUS | Status: DC | PRN

## 2022-02-03 MED ORDER — SODIUM CHLORIDE 0.9 % IV SOLN: 250.0000 mL | INTRAVENOUS | Status: DC | PRN

## 2022-02-03 SURGICAL SUPPLY — 45 items
APL PRP STRL LF DISP 70% ISPRP (MISCELLANEOUS) ×1
APL SKNCLS STERI-STRIP NONHPOA (GAUZE/BANDAGES/DRESSINGS) ×1
BAG COUNTER SPONGE SURGICOUNT (BAG) IMPLANT
BAG SPNG CNTER NS LX DISP (BAG)
BENZOIN TINCTURE PRP APPL 2/3 (GAUZE/BANDAGES/DRESSINGS) ×2 IMPLANT
BLADE SURG 15 STRL LF DISP TIS (BLADE) ×1 IMPLANT
BLADE SURG 15 STRL SS (BLADE) ×2
CHLORAPREP W/TINT 26 (MISCELLANEOUS) ×2 IMPLANT
COVER SURGICAL LIGHT HANDLE (MISCELLANEOUS) ×2 IMPLANT
DRAIN PENROSE 0.5X18 (DRAIN) ×2 IMPLANT
DRAPE LAPAROSCOPIC ABDOMINAL (DRAPES) ×2 IMPLANT
ELECT REM PT RETURN 15FT ADLT (MISCELLANEOUS) ×2 IMPLANT
GAUZE SPONGE 4X4 12PLY STRL (GAUZE/BANDAGES/DRESSINGS) IMPLANT
GLOVE BIO SURGEON STRL SZ 6 (GLOVE) ×3 IMPLANT
GLOVE INDICATOR 6.5 STRL GRN (GLOVE) ×3 IMPLANT
GLOVE SS BIOGEL STRL SZ 6 (GLOVE) ×1 IMPLANT
GLOVE SUPERSENSE BIOGEL SZ 6 (GLOVE) ×2
GOWN STRL REUS W/ TWL LRG LVL3 (GOWN DISPOSABLE) ×1 IMPLANT
GOWN STRL REUS W/ TWL XL LVL3 (GOWN DISPOSABLE) IMPLANT
GOWN STRL REUS W/TWL LRG LVL3 (GOWN DISPOSABLE) ×6
GOWN STRL REUS W/TWL XL LVL3 (GOWN DISPOSABLE)
KIT BASIN OR (CUSTOM PROCEDURE TRAY) ×2 IMPLANT
KIT TURNOVER KIT A (KITS) ×1 IMPLANT
MARKER SKIN DUAL TIP RULER LAB (MISCELLANEOUS) ×2 IMPLANT
MESH ULTRAPRO 3X6 7.6X15CM (Mesh General) ×1 IMPLANT
NEEDLE HYPO 22GX1.5 SAFETY (NEEDLE) ×2 IMPLANT
PACK BASIC VI WITH GOWN DISP (CUSTOM PROCEDURE TRAY) ×2 IMPLANT
PENCIL SMOKE EVACUATOR (MISCELLANEOUS) IMPLANT
SPIKE FLUID TRANSFER (MISCELLANEOUS) ×2 IMPLANT
SPONGE T-LAP 4X18 ~~LOC~~+RFID (SPONGE) ×2 IMPLANT
STRIP CLOSURE SKIN 1/2X4 (GAUZE/BANDAGES/DRESSINGS) ×2 IMPLANT
SUT ETHIBOND 0 MO6 C/R (SUTURE) ×2 IMPLANT
SUT MNCRL AB 4-0 PS2 18 (SUTURE) ×2 IMPLANT
SUT PDS AB 0 CT1 36 (SUTURE) ×4 IMPLANT
SUT SILK 3 0 (SUTURE) ×2
SUT SILK 3-0 18XBRD TIE 12 (SUTURE) ×1 IMPLANT
SUT VIC AB 3-0 SH 27 (SUTURE) ×4
SUT VIC AB 3-0 SH 27XBRD (SUTURE) ×2 IMPLANT
SUT VICRYL 0 UR6 27IN ABS (SUTURE) IMPLANT
SUT VICRYL 3 0 BR 18  UND (SUTURE) ×2
SUT VICRYL 3 0 BR 18 UND (SUTURE) ×1 IMPLANT
SYR CONTROL 10ML LL (SYRINGE) ×2 IMPLANT
TOWEL OR 17X26 10 PK STRL BLUE (TOWEL DISPOSABLE) ×2 IMPLANT
TOWEL OR NON WOVEN STRL DISP B (DISPOSABLE) ×2 IMPLANT
TRAY FOLEY MTR SLVR 16FR STAT (SET/KITS/TRAYS/PACK) IMPLANT

## 2022-02-03 NOTE — Anesthesia Procedure Notes (Signed)
Procedure Name: Intubation ?Date/Time: 02/03/2022 2:48 PM ?Performed by: Gerald Leitz, CRNA ?Pre-anesthesia Checklist: Patient identified, Patient being monitored, Timeout performed, Emergency Drugs available and Suction available ?Patient Re-evaluated:Patient Re-evaluated prior to induction ?Oxygen Delivery Method: Circle system utilized ?Preoxygenation: Pre-oxygenation with 100% oxygen ?Induction Type: IV induction ?Ventilation: Mask ventilation without difficulty ?Laryngoscope Size: Mac and 3 ?Grade View: Grade I ?Tube type: Oral ?Tube size: 7.5 mm ?Number of attempts: 1 ?Placement Confirmation: ETT inserted through vocal cords under direct vision, positive ETCO2 and breath sounds checked- equal and bilateral ?Secured at: 21 cm ?Tube secured with: Tape ?Dental Injury: Teeth and Oropharynx as per pre-operative assessment  ? ? ? ? ?

## 2022-02-03 NOTE — Anesthesia Postprocedure Evaluation (Signed)
Anesthesia Post Note ? ?Patient: THERAN VANDERGRIFT ? ?Procedure(s) Performed: OPEN RIGHT INGUINAL HERNIA REPAIR WITH MESH (Right: Inguinal) ? ?  ? ?Patient location during evaluation: PACU ?Anesthesia Type: General ?Level of consciousness: awake and alert ?Pain management: pain level controlled ?Vital Signs Assessment: post-procedure vital signs reviewed and stable ?Respiratory status: spontaneous breathing, nonlabored ventilation, respiratory function stable and patient connected to nasal cannula oxygen ?Cardiovascular status: blood pressure returned to baseline and stable ?Postop Assessment: no apparent nausea or vomiting ?Anesthetic complications: no ? ? ?No notable events documented. ? ?Last Vitals:  ?Vitals:  ? 02/03/22 1700 02/03/22 1715  ?BP: (!) 141/69   ?Pulse: 65 62  ?Resp: 15   ?Temp:    ?SpO2: 98% 98%  ?  ?Last Pain:  ?Vitals:  ? 02/03/22 1715  ?TempSrc:   ?PainSc: 0-No pain  ? ? ?  ?  ?  ?  ?  ?  ? ?Joydan Gretzinger L Kaliya Shreiner ? ? ? ? ?

## 2022-02-03 NOTE — Transfer of Care (Signed)
Immediate Anesthesia Transfer of Care Note ? ?Patient: [name] [name] ? ?Procedure(s) Performed: Procedure(s): ?OPEN RIGHT INGUINAL HERNIA REPAIR WITH MESH (Right) ? ?Patient Location: PACU ? ?Anesthesia Type:General ? ?Level of Consciousness: Alert, Awake, Oriented ? ?Airway & Oxygen Therapy: Patient Spontanous Breathing ? ?Post-op Assessment: Report given to RN ? ?Post vital signs: Reviewed and stable ? ?Last Vitals:  ?Vitals:  ? 02/03/22 1244  ?BP: (!) 173/81  ?Pulse: 60  ?Resp: 18  ?Temp: (!) 36.3 ?C  ?SpO2: 96%  ? ? ?Complications: No apparent anesthesia complications ? ?

## 2022-02-03 NOTE — Discharge Instructions (Signed)
HERNIA REPAIR: POST OP INSTRUCTIONS ? ? ?EAT ?Gradually transition to a high fiber diet with a fiber supplement over the next few weeks after discharge.  Start with a pureed / full liquid diet (see below) ? ?WALK ?Walk an hour a day (cumulative- not all at once).  Control your pain to do that.   ? ?CONTROL PAIN ?Control pain so that you can walk, sleep, tolerate sneezing/coughing, and go up/down stairs. ? ?HAVE A BOWEL MOVEMENT DAILY ?Keep your bowels regular to avoid problems.  OK to try a laxative to override constipation.  OK to use an antidairrheal to slow down diarrhea.  Call if not better after 2 tries ? ?CALL IF YOU HAVE PROBLEMS/CONCERNS ?Call if you are still struggling despite following these instructions. ?Call if you have concerns not answered by these instructions ? ?###################################################################### ? ? ? ?DIET: Follow a light bland diet & liquids the first 24 hours after arrival home, such as soup, liquids, starches, etc.  Be sure to drink plenty of fluids.  Quickly advance to a usual solid diet within a few days.  Avoid fast food or heavy meals as your are more likely to get nauseated or have irregular bowels.  A low-sugar, high-fiber diet for the rest of your life is ideal.  ? ?Take your usually prescribed home medications unless otherwise directed. ? ?PAIN CONTROL: ?Pain is best controlled by a usual combination of three different methods TOGETHER: ?Ice/Heat ?Over the counter pain medication ?Prescription pain medication ?Most patients will experience some swelling and bruising around the hernia(s) such as the bellybutton, groins, or old incisions.  Ice packs or heating pads (30-60 minutes up to 6 times a day) will help. Use ice for the first few days to help decrease swelling and bruising, then switch to heat to help relax tight/sore spots and speed recovery.  Some people prefer to use ice alone, heat alone, alternating between ice & heat.  Experiment to what  works for you.  Swelling and bruising can take several weeks to resolve.   ?It is helpful to take an over-the-counter pain medication regularly for the first days: ?Naproxen (Aleve, etc)  Two 220mg tabs twice a day OR Ibuprofen (Advil, etc) Three 200mg tabs four times a day (every meal & bedtime) AND ?Acetaminophen (Tylenol, etc) 325-650mg four times a day (every meal & bedtime) ?A  prescription for pain medication should be given to you upon discharge.  Take your pain medication as prescribed, IF NEEDED.  ?If you are having problems/concerns with the prescription medicine (does not control pain, nausea, vomiting, rash, itching, etc), please call us [phone] to see if we need to switch you to a different pain medicine that will work better for you and/or control your side effect better. ?If you need a refill on your pain medication, please contact your pharmacy.  They will contact our office to request authorization. Prescriptions will not be filled after 5 pm or on week-ends. ? ?Avoid getting constipated.  Between the surgery and the pain medications, it is common to experience some constipation.  Increasing fluid intake and taking a fiber supplement (such as Metamucil, Citrucel, FiberCon, MiraLax, etc) 1-2 times a day regularly will usually help prevent this problem from occurring.  A mild laxative (prune juice, Milk of Magnesia, MiraLax, etc) should be taken according to package directions if there are no bowel movements after 48 hours.   ? ?Wash / shower every day, starting 2 days after surgery.  You may shower over the   steri strips which are waterproof.  Do not submerge or soak.  No rubbing, scrubbing, lotions or ointments to incision. ? ?Remove your outer bandage 2 days after surgery.  Steri-Strips will peel off after 1 to 2 weeks. you may leave the incision open to air.  You may replace a dressing/Band-Aid to cover an incision for comfort if you wish.  Continue to shower over incision(s) after the  dressing is off. ? ?ACTIVITIES as tolerated:   ?You may resume regular (light) daily activities beginning the next day--such as daily self-care, walking, climbing stairs--gradually increasing activities as tolerated.  Control your pain so that you can walk an hour a day.  If you can walk 30 minutes without difficulty, it is safe to try more intense activity such as jogging, treadmill, bicycling, low-impact aerobics, swimming, etc. ?Refrain from the most intensive and strenuous activity such as sit-ups, heavy lifting, contact sports, etc  Refrain from any heavy lifting or straining until 6 weeks after surgery.   ?DO NOT PUSH THROUGH PAIN.  Let pain be your guide: If it hurts to do something, don't do it.  Pain is your body warning you to avoid that activity for another week until the pain goes down. ?You may drive when you are no longer taking prescription pain medication, you can comfortably wear a seatbelt, and you can safely maneuver your car and apply brakes. ?You may have sexual intercourse when it is comfortable.  ? ?FOLLOW UP in our office ?Please call [facility] at [phone] to set up an appointment to see your surgeon in the office for a follow-up appointment approximately 2-3 weeks after your surgery. ?Make sure that you call for this appointment the day you arrive home to insure a convenient appointment time. ? ?9.  If you have disability of FMLA / Family leave forms, please bring the forms to the office for processing.  (do not give to your surgeon). ? ?WHEN TO CALL us [phone]: ?Poor pain control ?Reactions / problems with new medications (rash/itching, nausea, etc)  ?Fever over 101.5 F (38.5 C) ?Inability to urinate ?Nausea and/or vomiting ?Worsening swelling or bruising ?Continued bleeding from incision. ?Increased pain, redness, or drainage from the incision ? ? The clinic staff is available to answer your questions during regular business hours (8:30am-5pm).  Please don?t hesitate to call and ask  to speak to one of our nurses for clinical concerns.  ? If you have a medical emergency, go to the nearest emergency room or call 911. ? A surgeon from [facility] is always on call at the hospitals in [place] ? ?[facility] [place] ?[address] [place], [place], [place]  [address] ? ? [address], [place], [place]   [address] ?MAIN: [phone] ? TOLL FREE: [phone] ? FAX: [phone] ?[email or web address] ? ?

## 2022-02-03 NOTE — Op Note (Signed)
Operative Note ? ?[name] [name]  ?[id number]  ?[id number]  ?02/03/2022 ? ? ?Surgeon: Phylliss Blakes MD FACS ?  ?Procedure performed: Open inguinal hernia repair with UltraPro mesh: right indirect ?  ?Preop diagnosis: incarcerated right inguinal hernia ?  ?Post-op diagnosis/intraop findings:  Chronically incarcerated right indirect inguinal scrotal hernia ?  ?Specimens: none ?  ?EBL: 5cc ?  ?Complications: none ?  ?Description of procedure: After obtaining informed consent the patient was taken to the operating room and placed supine on operating room table where general anesthesia was initiated, preoperative antibiotics were administered, SCDs applied, and a formal timeout was performed. The groin was clipped, prepped and draped in the usual sterile fashion. An oblique incision was made in the just above the inguinal ligament after infiltrating the tissues with local anesthetic (Exparel mixed with quarter percent Marcaine with epinephrine). Soft tissues were dissected using electrocautery until the external oblique aponeurosis was encountered. This was divided sharply to expand the external ring. A plane was bluntly developed between the spermatic cord and the external oblique. The spermatic cord was then bluntly dissected away from the pubic tubercle and encircled with a Penrose. Inspection of the inguinal anatomy revealed a large indirect hernia sac encased in very thickened cremasterics tissue extending into the scrotum. The indirect hernia sac and cord structures were carefully separated with a combination of blunt and cautery dissection until the indirect sac could be skeletonized down to the level of the internal ring, where it was reduced into the abdominal cavity intact.  The internal ring was narrowed with interrupted 0 PDS sutures to a diameter just sufficient to accommodate the cord structures and the inguinal floor was reconstructed with additional interrupted 0 PDS sutures medially where there was a small  direct defect.  A 3 x 6 piece of ultra Pro mesh was brought onto the field and trimmed to approximate the field. This was sutured to the pubic tubercle fascia using 0 ethibond. Interrupted 0 ethibonds were then used to suture the mesh to the inferior shelving edge and to the internal oblique superiorly. The tails of the mesh were wrapped around the spermatic cord, ensuring just adequate room for the cord, and sutured to each other with 0 ethibond, and then directed laterally to lie flat beneath the external oblique aponeurosis. Hemostasis was ensured within the wound. The Penrose was removed. The external oblique aponeurosis was reapproximated with a running 3-0 Vicryl to re-create a narrowed external ring. More local was infiltrated around the pubic tubercle and in the plane just below the external oblique. The Scarpa's was reapproximated with interrupted 3-0 Vicryls. The skin was closed with a running subcuticular Monocryl. The remainder of the local was injected in the subcutaneous and subcuticular space. The field was then cleaned, benzoin and Steri-Strips and sterile bandage were applied. Both testicles were palpated in the scrotum at the end of the case. The patient was then awakened extubated and taken to PACU in stable condition.  ?  ?All counts were correct at the completion of the case ? ?

## 2022-02-03 NOTE — Interval H&P Note (Signed)
History and Physical Interval Note: ? ?02/03/2022 ?1:47 PM ? ?[name] [name]  has presented today for surgery, with the diagnosis of INCARCERATED INGUINAL HERNIA.  The various methods of treatment have been discussed with the patient and family. After consideration of risks, benefits and other options for treatment, the patient has consented to  Procedure(s): ?OPEN RIGHT INGUINAL HERNIA REPAIR WITH MESH (Right) as a surgical intervention.  The patient's history has been reviewed, patient examined, no change in status, stable for surgery.  I have reviewed the patient's chart and labs.  Questions were answered to the patient's satisfaction.   ? ? ?[name] ? ? ?

## 2022-02-04 ENCOUNTER — Encounter (HOSPITAL_COMMUNITY): Payer: Self-pay | Admitting: Surgery

## 2022-02-26 DIAGNOSIS — E782 Mixed hyperlipidemia: Secondary | ICD-10-CM | POA: Diagnosis not present

## 2022-02-26 DIAGNOSIS — I10 Essential (primary) hypertension: Secondary | ICD-10-CM | POA: Diagnosis not present

## 2022-02-26 DIAGNOSIS — N400 Enlarged prostate without lower urinary tract symptoms: Secondary | ICD-10-CM | POA: Diagnosis not present

## 2022-02-26 DIAGNOSIS — E1151 Type 2 diabetes mellitus with diabetic peripheral angiopathy without gangrene: Secondary | ICD-10-CM | POA: Diagnosis not present

## 2022-03-05 DIAGNOSIS — E538 Deficiency of other specified B group vitamins: Secondary | ICD-10-CM | POA: Diagnosis not present

## 2022-04-02 DIAGNOSIS — R3912 Poor urinary stream: Secondary | ICD-10-CM | POA: Diagnosis not present

## 2022-04-02 DIAGNOSIS — N200 Calculus of kidney: Secondary | ICD-10-CM | POA: Diagnosis not present

## 2022-04-02 DIAGNOSIS — N401 Enlarged prostate with lower urinary tract symptoms: Secondary | ICD-10-CM | POA: Diagnosis not present

## 2022-04-06 DIAGNOSIS — E538 Deficiency of other specified B group vitamins: Secondary | ICD-10-CM | POA: Diagnosis not present

## 2022-04-21 ENCOUNTER — Ambulatory Visit (INDEPENDENT_AMBULATORY_CARE_PROVIDER_SITE_OTHER): Payer: PPO

## 2022-04-21 DIAGNOSIS — I4510 Unspecified right bundle-branch block: Secondary | ICD-10-CM | POA: Diagnosis not present

## 2022-04-21 LAB — CUP PACEART REMOTE DEVICE CHECK
Battery Remaining Longevity: 140 mo
Battery Voltage: 3.07 V
Brady Statistic AP VP Percent: 81.12 %
Brady Statistic AP VS Percent: 0.65 %
Brady Statistic AS VP Percent: 15.12 %
Brady Statistic AS VS Percent: 3.11 %
Brady Statistic RA Percent Paced: 82.32 %
Brady Statistic RV Percent Paced: 96.24 %
Date Time Interrogation Session: 20230627053342
Implantable Lead Implant Date: 20220922
Implantable Lead Implant Date: 20220922
Implantable Lead Location: 753859
Implantable Lead Location: 753860
Implantable Lead Model: 3830
Implantable Lead Model: 5076
Implantable Pulse Generator Implant Date: 20220922
Lead Channel Impedance Value: 323 Ohm
Lead Channel Impedance Value: 342 Ohm
Lead Channel Impedance Value: 494 Ohm
Lead Channel Impedance Value: 532 Ohm
Lead Channel Pacing Threshold Amplitude: 0.75 V
Lead Channel Pacing Threshold Amplitude: 1 V
Lead Channel Pacing Threshold Pulse Width: 0.4 ms
Lead Channel Pacing Threshold Pulse Width: 0.4 ms
Lead Channel Sensing Intrinsic Amplitude: 12.875 mV
Lead Channel Sensing Intrinsic Amplitude: 12.875 mV
Lead Channel Sensing Intrinsic Amplitude: 3.5 mV
Lead Channel Sensing Intrinsic Amplitude: 3.5 mV
Lead Channel Setting Pacing Amplitude: 1.5 V
Lead Channel Setting Pacing Amplitude: 2 V
Lead Channel Setting Pacing Pulse Width: 0.4 ms
Lead Channel Setting Sensing Sensitivity: 0.9 mV

## 2022-04-23 DIAGNOSIS — E1151 Type 2 diabetes mellitus with diabetic peripheral angiopathy without gangrene: Secondary | ICD-10-CM | POA: Diagnosis not present

## 2022-04-23 DIAGNOSIS — E782 Mixed hyperlipidemia: Secondary | ICD-10-CM | POA: Diagnosis not present

## 2022-04-23 DIAGNOSIS — I10 Essential (primary) hypertension: Secondary | ICD-10-CM | POA: Diagnosis not present

## 2022-05-07 DIAGNOSIS — E538 Deficiency of other specified B group vitamins: Secondary | ICD-10-CM | POA: Diagnosis not present

## 2022-05-12 NOTE — Progress Notes (Signed)
Remote pacemaker transmission.   

## 2022-06-08 DIAGNOSIS — E538 Deficiency of other specified B group vitamins: Secondary | ICD-10-CM | POA: Diagnosis not present

## 2022-06-18 DIAGNOSIS — E1151 Type 2 diabetes mellitus with diabetic peripheral angiopathy without gangrene: Secondary | ICD-10-CM | POA: Diagnosis not present

## 2022-06-18 DIAGNOSIS — E039 Hypothyroidism, unspecified: Secondary | ICD-10-CM | POA: Diagnosis not present

## 2022-06-18 DIAGNOSIS — E782 Mixed hyperlipidemia: Secondary | ICD-10-CM | POA: Diagnosis not present

## 2022-06-18 DIAGNOSIS — I10 Essential (primary) hypertension: Secondary | ICD-10-CM | POA: Diagnosis not present

## 2022-06-18 DIAGNOSIS — N400 Enlarged prostate without lower urinary tract symptoms: Secondary | ICD-10-CM | POA: Diagnosis not present

## 2022-07-03 ENCOUNTER — Telehealth: Payer: Self-pay | Admitting: Interventional Cardiology

## 2022-07-03 NOTE — Telephone Encounter (Signed)
Pt c/o of Chest Pain: STAT if CP now or developed within 24 hours  1. Are you having CP right now? No  2. Are you experiencing any other symptoms (ex. SOB, nausea, vomiting, sweating)? No  3. How long have you been experiencing CP?   Since last night  4. Is your CP continuous or coming and going?  Coming and going  5. Have you taken Nitroglycerin? Yes  Wife stated patient has been upset because his dog has passed away. ?

## 2022-07-03 NOTE — Telephone Encounter (Signed)
Returned call to patient's spouse [name] [name] (OK per DPR).  She reports patient has had chest pain 3 times in the last 3 weeks, he had it twice while mowing the yard and again last night when their dog passed away.  [name] [name] states patient sat down and took SL NTG x1 with relief of CP. She does not have recent BP readings but states BP has been within normal limits for him.  Patient denies CP at time of call. Sabino Dick on ED precautions.  Made 1 yr follow-up appt with Dr. Katrinka Blazing for 10/13/22.  Will forward to Dr. Katrinka Blazing to review and advise.  I also made patient and spouse aware that Dr. Katrinka Blazing will be retiring in January.

## 2022-07-03 NOTE — Telephone Encounter (Signed)
Left message for patient's spouse [name] to callback and make an earlier appt per Dr. Katrinka Blazing for evaluation of patient's chest pain.

## 2022-07-03 NOTE — Telephone Encounter (Signed)
Appt made via operator. Patient to see Dr. Katrinka Blazing on 07/07/22.

## 2022-07-06 NOTE — Progress Notes (Signed)
Cardiology Office Note:    Date:  07/07/2022   ID:  [name] [name], DOB Apr 28, 1941, MRN [id number]  PCP:  [name] Carol, MD  Cardiologist:  [name] Grooms, MD   Referring MD: [name] Carol, MD   Chief Complaint  Patient presents with   Coronary Artery Disease    Angina pectoris    History of Present Illness:    [name] [name] is a 81 y.o. male with a hx of CAD, history of coronary artery bypass grafting, type 2 diabetes mellitus, permanent pacemaker, peripheral arterial disease, primary hypertension, and h/o near syncope.    He is having more angina over the past 2 to 3 months.  Over the past month he has had 3 episodes of angina.  2 occurred while mowing the grass.  They went away promptly with sublingual nitroglycerin.  Another occurred 5 days ago after the death of their longtime puppy.  He has had no angina since that time.  Not needed to use any nitroglycerin.  Past Medical History:  Diagnosis Date   Benign prostatic hypertrophy with lower urinary tract symptoms (LUTS)    Bilateral edema of lower extremity    IMPROVED W/ TED HOSE   CAD (coronary artery disease)    cardiologist-  dr [name] [name]--  s/p  cabg x6 in 1998   Heart murmur    Hiatal hernia    History of kidney stones    Hydronephrosis, left    Hyperlipidemia    Hypertension    Hypothyroidism    Left ureteral stone    Memory difficulties    Nephrolithiasis    right side per ct  non-obstructive   PVD (peripheral vascular disease) (HCC)    RBBB (right bundle branch block)    S/P CABG x 6    1998   Sigmoid diverticulosis    Sinus bradycardia    Type 2 diabetes mellitus ([place])     Past Surgical History:  Procedure Laterality Date   CATARACT EXTRACTION W/ INTRAOCULAR LENS  IMPLANT, BILATERAL  2011   CORONARY ARTERY BYPASS GRAFT  1998   CABG X 6 --   LIMA  to the LAD and  other undefined SVG's   CYSTOSCOPY WITH BIOPSY N/A 11/29/2015   Procedure: CYSTOSCOPY WITH BIOPSY AND FULGERATION;  Surgeon:  [name] Frock, MD;  Location: [facility];  Service: Urology;  Laterality: N/A;   CYSTOSCOPY/RETROGRADE/URETEROSCOPY/STONE EXTRACTION WITH BASKET Left 11/29/2015   Procedure: CYSTOSCOPY/RETROGRADE/DIAGNOSTIC URETEROSCOPY/STONE EXTRACTION;  Surgeon: [name] Frock, MD;  Location: [facility];  Service: Urology;  Laterality: Left;   EXTRACORPOREAL SHOCK WAVE LITHOTRIPSY Left 08-12-2015   INGUINAL HERNIA REPAIR Right 02/03/2022   Procedure: OPEN RIGHT INGUINAL HERNIA REPAIR WITH MESH;  Surgeon: [name] Riley, MD;  Location: [facility];  Service: General;  Laterality: Right;   LEFT HEART CATHETERIZATION WITH CORONARY/GRAFT ANGIOGRAM  06/16/2013   Procedure: LEFT HEART CATHETERIZATION WITH [name];  Surgeon: [name] Grooms, MD;  Location: [facility] CATH LAB;  Service: Cardiovascular; Native vessel occlusive disease w/ ostial total occlusion RCA, dLM, & heavy calicification LM LAD CX RCA/  SVG to diagonal patent, SVG to marginal 1 patent & 2 occluded, LIMA to dLAD patent/ dCFX collateral from obtuse marginal graft/  normal LVF   PACEMAKER IMPLANT N/A 07/17/2021   Procedure: PACEMAKER IMPLANT;  Surgeon: [name] Lance, MD;  Location: [place] CV LAB;  Service: Cardiovascular;  Laterality: N/A;   TRANSTHORACIC ECHOCARDIOGRAM  05-23-2015  dr [name] [name]  mild LVH,  grade 1 diastolic dysfunction,  ef 55-060%,  mild AV calcifacation without stenosis,  mild AR and MR,  moderate LAE,  trivial PR and TR,  mild increase pulmonary pressure    Current Medications: Current Meds  Medication Sig   acetaminophen (TYLENOL) 325 MG tablet Take 1-2 tablets (325-650 mg total) by mouth every 4 (four) hours as needed for mild pain.   apixaban (ELIQUIS) 5 MG TABS tablet Start taking 07/22/21 in the morning 1 tablet (5 mg total) by mouth 2 (two) times daily.   atorvastatin (LIPITOR) 80 MG tablet Take 40 mg by mouth every morning.    Cyanocobalamin 1000 MCG/ML KIT Inject 1,000 mcg as  directed every 30 (thirty) days.   finasteride (PROSCAR) 5 MG tablet Take 5 mg by mouth every morning.    Flaxseed, Linseed, (FLAX SEEDS PO) Take 1 capsule by mouth daily.   hydrochlorothiazide (HYDRODIURIL) 25 MG tablet Take 25 mg by mouth every morning.    levothyroxine (SYNTHROID, LEVOTHROID) 100 MCG tablet Take 100 mcg by mouth daily before breakfast.   memantine (NAMENDA) 10 MG tablet Take 1 tablet (10 mg total) by mouth 2 (two) times daily.   metFORMIN (GLUCOPHAGE) 500 MG tablet Take 500 mg by mouth daily with breakfast.   metoprolol succinate (TOPROL-XL) 25 MG 24 hr tablet TAKE 1 TABLET BY MOUTH ONCE DAILY. PATIENT MUST KEEP UPCOMING APPOINTMENT IN JAN 2023 BEFORE ANYMORE REFILLS (Patient taking differently: Take 25 mg by mouth daily.)   nitroGLYCERIN (NITROSTAT) 0.4 MG SL tablet DISSOLVE ONE TABLET UNDER THE TONGUE EVERY 5 MINUTES AS NEEDED FOR CHEST PAIN (Patient taking differently: Place 0.4 mg under the tongue every 5 (five) minutes as needed for chest pain.)   potassium chloride SA (K-DUR,KLOR-CON) 20 MEQ tablet Take 10 mEq by mouth every morning.    tamsulosin (FLOMAX) 0.4 MG CAPS capsule Take 0.4 mg by mouth daily.     Allergies:   Spironolactone and Penicillins   Social History   Socioeconomic History   Marital status: Married    Spouse name: [name] [name]   Number of children: 2   Years of education: 12   Highest education level: Not on file  Occupational History    Comment: retired  Tobacco Use   Smoking status: Former    Packs/day: 0.25    Years: 10.00    Total pack years: 2.50    Types: Cigarettes    Quit date: 10/26/1966    Years since quitting: 55.7   Smokeless tobacco: Never  Vaping Use   Vaping Use: Never used  Substance and Sexual Activity   Alcohol use: No    Alcohol/week: 0.0 standard drinks of alcohol   Drug use: No   Sexual activity: Not on file  Other Topics Concern   Not on file  Social History Narrative   06/09/21 lives with wife   Social  Determinants of Health   Financial Resource Strain: Not on file  Food Insecurity: Not on file  Transportation Needs: Not on file  Physical Activity: Not on file  Stress: Not on file  Social Connections: Not on file     Family History: The patient's family history includes CAD in his father; Heart disease in his sister; Tuberculosis in his mother.  ROS:   Please see the history of present illness.    Decreased memory.  Not driving independently anymore.  All other systems reviewed and are negative.  EKGs/Labs/Other Studies Reviewed:    The following studies were reviewed today: 2D  Doppler echocardiogram September 2022: IMPRESSIONS     1. Left ventricular ejection fraction, by estimation, is 50 to 55%. The  left ventricle has low normal function. The left ventricle has no regional  wall motion abnormalities. There is mild left ventricular hypertrophy.  Left ventricular diastolic  parameters are indeterminate.   2. Right ventricular systolic function is normal. The right ventricular  size is normal. Tricuspid regurgitation signal is inadequate for assessing  PA pressure.   3. Left atrial size was moderately dilated.   4. Right atrial size was mildly dilated.   5. The mitral valve is grossly normal. Mild mitral valve regurgitation.  No evidence of mitral stenosis.   6. The aortic valve is grossly normal. There is moderate calcification of  the aortic valve. Aortic valve regurgitation is trivial. Mild aortic valve  sclerosis is present, with no evidence of aortic valve stenosis.   7. Aortic dilatation noted. There is mild dilatation of the aortic root,  measuring 41 mm.   EKG:  EKG AV sequential pacing.  Recent Labs: 07/16/2021: ALT 12 01/29/2022: BUN 23; Creatinine, Ser 0.97; Hemoglobin 12.7; Platelets 212; Potassium 3.9; Sodium 139  Recent Lipid Panel No results found for: "CHOL", "TRIG", "HDL", "CHOLHDL", "VLDL", "LDLCALC", "LDLDIRECT"  Physical Exam:    VS:  BP 110/72    Pulse 60   Ht '5\' 6"'  (1.676 m)   Wt 174 lb 12.8 oz (79.3 kg)   SpO2 94%   BMI 28.21 kg/m     Wt Readings from Last 3 Encounters:  07/07/22 174 lb 12.8 oz (79.3 kg)  01/29/22 170 lb (77.1 kg)  11/24/21 165 lb (74.8 kg)     GEN: Overweight. No acute distress HEENT: Normal NECK: No JVD. LYMPHATICS: No lymphadenopathy CARDIAC: 2/6 right upper sternal systolic murmur. RRR no gallop, or edema. VASCULAR:  Normal Pulses. No bruits. RESPIRATORY:  Clear to auscultation without rales, wheezing or rhonchi  ABDOMEN: Soft, non-tender, non-distended, No pulsatile mass, MUSCULOSKELETAL: No deformity  SKIN: Warm and dry NEUROLOGIC:  Alert and oriented x 3 PSYCHIATRIC:  Normal affect   ASSESSMENT:    1. Coronary artery disease involving bypass graft of transplanted heart without angina pectoris   2. New onset atrial fibrillation ([place])   3. Pacemaker   4. Memory change   5. Chronic anticoagulation   6. Right bundle branch block   7. Type 2 diabetes mellitus with peripheral artery disease (HCC)    PLAN:    In order of problems listed above:  Start isosorbide mononitrate 30 mg/day.  Follow-up with team member in 1 month for further up titration if needed. No evidence of atrial fibrillation on monitor today. Normal pacemaker function.  This is progressive.  Conservative medical management until or if symptoms provoked further investigation with coronary angiography.  Hopefully we can optimize nitrates and avoid angiography. Continue Eliquis.   Medication Adjustments/Labs and Tests Ordered: Current medicines are reviewed at length with the patient today.  Concerns regarding medicines are outlined above.  Orders Placed This Encounter  Procedures   EKG 12-Lead   No orders of the defined types were placed in this encounter.   There are no Patient Instructions on file for this visit.   Signed, [name] Grooms, MD  07/07/2022 3:49 PM    [place]

## 2022-07-07 ENCOUNTER — Encounter: Payer: Self-pay | Admitting: Interventional Cardiology

## 2022-07-07 ENCOUNTER — Ambulatory Visit: Payer: PPO | Attending: Interventional Cardiology | Admitting: Interventional Cardiology

## 2022-07-07 VITALS — BP 110/72 | HR 60 | Ht 66.0 in | Wt 174.8 lb

## 2022-07-07 DIAGNOSIS — I25812 Atherosclerosis of bypass graft of coronary artery of transplanted heart without angina pectoris: Secondary | ICD-10-CM

## 2022-07-07 DIAGNOSIS — I4510 Unspecified right bundle-branch block: Secondary | ICD-10-CM

## 2022-07-07 DIAGNOSIS — E1151 Type 2 diabetes mellitus with diabetic peripheral angiopathy without gangrene: Secondary | ICD-10-CM | POA: Diagnosis not present

## 2022-07-07 DIAGNOSIS — Z7901 Long term (current) use of anticoagulants: Secondary | ICD-10-CM | POA: Diagnosis not present

## 2022-07-07 DIAGNOSIS — I4891 Unspecified atrial fibrillation: Secondary | ICD-10-CM

## 2022-07-07 DIAGNOSIS — R413 Other amnesia: Secondary | ICD-10-CM | POA: Diagnosis not present

## 2022-07-07 DIAGNOSIS — Z950 Presence of cardiac pacemaker: Secondary | ICD-10-CM | POA: Diagnosis not present

## 2022-07-07 MED ORDER — ISOSORBIDE MONONITRATE ER 30 MG PO TB24: 30.0000 mg | ORAL_TABLET | Freq: Every day | ORAL | 3 refills | Status: DC

## 2022-07-07 NOTE — Addendum Note (Signed)
Addended by: Franchot Gallo on: 07/07/2022 03:54 PM   Modules accepted: Orders

## 2022-07-07 NOTE — Patient Instructions (Signed)
Medication Instructions:  Your physician has recommended you make the following change in your medication:   1) START isosorbide mononitrate (Imdur) 30mg  daily  *If you need a refill on your cardiac medications before your next appointment, please call your pharmacy*  Lab Work: NONE  Testing/Procedures: NONE  Follow-Up: At [facility], you and your health needs are our priority.  As part of our continuing mission to provide you with exceptional heart care, we have created designated Provider Care Teams.  These Care Teams include your primary Cardiologist (physician) and Advanced Practice Providers (APPs -  Physician Assistants and Nurse Practitioners) who all work together to provide you with the care you need, when you need it.  Your next appointment:   1 month(s)  The format for your next appointment:   In Person  Provider:   [facility], MD  or Lesleigh Noe, PA-C, Chelsea Aus, PA-C, Jari Favre, NP, Robin Searing, NP, or Eligha Bridegroom, PA-C    Important Information About Sugar

## 2022-07-09 ENCOUNTER — Telehealth: Payer: Self-pay

## 2022-07-09 DIAGNOSIS — E538 Deficiency of other specified B group vitamins: Secondary | ICD-10-CM | POA: Diagnosis not present

## 2022-07-09 NOTE — Telephone Encounter (Signed)
**Note De-Identified  Obfuscation** The pts completed [employer] application for Eliquis assistance was left at the office with documents (no oop RX expense report).  I have completed the providers page of his application and have e-mailed it to Dr Lonn Georgia nurse so she can obtain his signature, date it, and to fax all to [facility] at the fax number written on the cover letter included.

## 2022-07-13 NOTE — Telephone Encounter (Signed)
Patient assistance form for Eliquis signed and faxed to [employer].

## 2022-07-17 DIAGNOSIS — H18413 Arcus senilis, bilateral: Secondary | ICD-10-CM | POA: Diagnosis not present

## 2022-07-17 DIAGNOSIS — Z961 Presence of intraocular lens: Secondary | ICD-10-CM | POA: Diagnosis not present

## 2022-07-17 DIAGNOSIS — E119 Type 2 diabetes mellitus without complications: Secondary | ICD-10-CM | POA: Diagnosis not present

## 2022-07-17 DIAGNOSIS — H04123 Dry eye syndrome of bilateral lacrimal glands: Secondary | ICD-10-CM | POA: Diagnosis not present

## 2022-07-21 ENCOUNTER — Ambulatory Visit (INDEPENDENT_AMBULATORY_CARE_PROVIDER_SITE_OTHER): Payer: PPO

## 2022-07-21 DIAGNOSIS — I4510 Unspecified right bundle-branch block: Secondary | ICD-10-CM | POA: Diagnosis not present

## 2022-07-22 LAB — CUP PACEART REMOTE DEVICE CHECK
Battery Remaining Longevity: 116 mo
Battery Voltage: 3.03 V
Brady Statistic AP VP Percent: 87.54 %
Brady Statistic AP VS Percent: 0.41 %
Brady Statistic AS VP Percent: 9.2 %
Brady Statistic AS VS Percent: 2.85 %
Brady Statistic RA Percent Paced: 88.99 %
Brady Statistic RV Percent Paced: 96.74 %
Date Time Interrogation Session: 20230926041043
Implantable Lead Implant Date: 20220922
Implantable Lead Implant Date: 20220922
Implantable Lead Location: 753859
Implantable Lead Location: 753860
Implantable Lead Model: 3830
Implantable Lead Model: 5076
Implantable Pulse Generator Implant Date: 20220922
Lead Channel Impedance Value: 323 Ohm
Lead Channel Impedance Value: 342 Ohm
Lead Channel Impedance Value: 475 Ohm
Lead Channel Impedance Value: 475 Ohm
Lead Channel Pacing Threshold Amplitude: 0.75 V
Lead Channel Pacing Threshold Amplitude: 1.25 V
Lead Channel Pacing Threshold Pulse Width: 0.4 ms
Lead Channel Pacing Threshold Pulse Width: 0.4 ms
Lead Channel Sensing Intrinsic Amplitude: 12.75 mV
Lead Channel Sensing Intrinsic Amplitude: 12.75 mV
Lead Channel Sensing Intrinsic Amplitude: 3.25 mV
Lead Channel Sensing Intrinsic Amplitude: 3.25 mV
Lead Channel Setting Pacing Amplitude: 1.5 V
Lead Channel Setting Pacing Amplitude: 2.75 V
Lead Channel Setting Pacing Pulse Width: 0.4 ms
Lead Channel Setting Sensing Sensitivity: 0.9 mV

## 2022-07-29 ENCOUNTER — Other Ambulatory Visit: Payer: Self-pay | Admitting: Interventional Cardiology

## 2022-07-29 DIAGNOSIS — I2510 Atherosclerotic heart disease of native coronary artery without angina pectoris: Secondary | ICD-10-CM

## 2022-07-30 NOTE — Progress Notes (Signed)
Remote pacemaker transmission.   

## 2022-08-06 NOTE — Progress Notes (Signed)
Office Visit    Patient Name: [name] [name] Date of Encounter: 08/06/2022  Primary Care Provider:  Seward Carol, MD Primary Cardiologist:  Sinclair Grooms, MD Primary Electrophysiologist: None  Chief Complaint    [name] [name] is a 81 y.o. male with PMH of CAD s/p CABG x6 in 1998, DM type II, RBBB, PVD, hiatal hernia, HLD, HTN, BPH, PAF and CHB s/p [employer] PPM 06/2021 who presents today for follow-up of coronary artery disease and stable angina.  Past Medical History    Past Medical History:  Diagnosis Date   Benign prostatic hypertrophy with lower urinary tract symptoms (LUTS)    Bilateral edema of lower extremity    IMPROVED W/ TED HOSE   CAD (coronary artery disease)    cardiologist-  [name]--  s/p  cabg x6 in 1998   Heart murmur    Hiatal hernia    History of kidney stones    Hydronephrosis, left    Hyperlipidemia    Hypertension    Hypothyroidism    Left ureteral stone    Memory difficulties    Nephrolithiasis    right side per ct  non-obstructive   PVD (peripheral vascular disease) (HCC)    RBBB (right bundle branch block)    S/P CABG x 6    1998   Sigmoid diverticulosis    Sinus bradycardia    Type 2 diabetes mellitus ([place])    Past Surgical History:  Procedure Laterality Date   CATARACT EXTRACTION W/ INTRAOCULAR LENS  IMPLANT, BILATERAL  2011   CORONARY ARTERY BYPASS GRAFT  1998   CABG X 6 --   LIMA  to the LAD and  other undefined SVG's   CYSTOSCOPY WITH BIOPSY N/A 11/29/2015   Procedure: CYSTOSCOPY WITH BIOPSY AND FULGERATION;  Surgeon: [name], MD;  Location: [facility];  Service: Urology;  Laterality: N/A;   CYSTOSCOPY/RETROGRADE/URETEROSCOPY/STONE EXTRACTION WITH BASKET Left 11/29/2015   Procedure: CYSTOSCOPY/RETROGRADE/DIAGNOSTIC URETEROSCOPY/STONE EXTRACTION;  Surgeon: [name], MD;  [facility];  Service: Urology;  Laterality: Left;   EXTRACORPOREAL SHOCK WAVE LITHOTRIPSY Left  08-12-2015   INGUINAL HERNIA REPAIR Right 02/03/2022   Procedure: OPEN RIGHT INGUINAL HERNIA REPAIR WITH MESH;  Surgeon: [name], MD;  Location: [facility];  Service: General;  Laterality: Right;   LEFT HEART CATHETERIZATION WITH CORONARY/GRAFT ANGIOGRAM  06/16/2013   Procedure: LEFT HEART CATHETERIZATION WITH [name];  Surgeon: Sinclair Grooms, MD;  Location: [facility] CATH LAB;  Service: Cardiovascular; Native vessel occlusive disease w/ ostial total occlusion RCA, dLM, & heavy calicification LM LAD CX RCA/  SVG to diagonal patent, SVG to marginal 1 patent & 2 occluded, LIMA to dLAD patent/ dCFX collateral from obtuse marginal graft/  normal LVF   PACEMAKER IMPLANT N/A 07/17/2021   Procedure: PACEMAKER IMPLANT;  Surgeon: [name], MD;  Location: [place] [facility];  Service: Cardiovascular;  Laterality: N/A;   TRANSTHORACIC ECHOCARDIOGRAM  05-23-2015  [name]   mild LVH,  grade 1 diastolic dysfunction,  ef 55-060%,  mild AV calcifacation without stenosis,  mild AR and MR,  moderate LAE,  trivial PR and TR,  mild increase pulmonary pressure    Allergies  Allergies  Allergen Reactions   Spironolactone Rash   Penicillins Rash    Has patient had a PCN reaction causing immediate rash, facial/tongue/throat swelling, SOB or lightheadedness with hypotension:no Has patient had a PCN reaction causing severe rash involving mucus membranes  or skin necrosis: no Has patient had a PCN reaction that required hospitalization no Has patient had a PCN reaction occurring within the last 10 years: no If all of the above answers are "NO", then may proceed with Cephalosporin use.     History of Present Illness    [name] [name]  is a 81 year old male with the above mention past medical history who presents today for follow-up of stable angina and coronary artery disease.  [name] [name] has been followed by Dr. Tamala Julian since 2014 when he was seen initially for complaint of exertional  chest pain.  Patient had diagnostic LHC performed in 2014 that revealed patent grafts and normal LV function.  He continued to do well but had bouts of intermittent angina with moderate to heavy physical exertion and in cold weather.  He was admitted on 07/16/2021 with weakness and fatigue and found to be in AF with SVR.  He was also found to have underlying RBBB with left anterior fascicular block.  His beta-blocker was held to washout for consideration of possible PPM.  Patient was treated with PPM that was placed by Dr. Lovena Le.  His AF has been controlled since PPM placement. He was seen most recently by Dr. Tamala Julian on 06/2022 for follow-up and complaint of angina.  He reported that pain occurred during her 3 episodes to while mowing grass.  This pain was alleviated with sublingual nitroglycerin.  He was started on isosorbide 30 mg a day and instructed to follow-up in 1 month.  Dr. Tamala Julian advised conservative approach unless symptoms are progressive and warrant further investigation with LHC.  [name] [name] presents today for 1 month follow-up with his wife for chest pain.  Since last being seen in the office patient reports he has been doing well with recurrence of chest pain.  He is compliant with current medications and blood pressures today was 140/60 with heart rate of 60 bpm.  He reports no dizziness or low blood pressures at home with Imdur prescription.  He is staying active and has not had any bleeding chest pain with activity.  Patient denies chest pain, palpitations, dyspnea, PND, orthopnea, nausea, vomiting, dizziness, syncope, edema, weight gain, or early satiety.  Home Medications    Current Outpatient Medications  Medication Sig Dispense Refill   acetaminophen (TYLENOL) 325 MG tablet Take 1-2 tablets (325-650 mg total) by mouth every 4 (four) hours as needed for mild pain. 30 tablet 0   apixaban (ELIQUIS) 5 MG TABS tablet Start taking 07/22/21 in the morning 1 tablet (5 mg total) by mouth 2 (two)  times daily. 60 tablet 0   atorvastatin (LIPITOR) 80 MG tablet Take 40 mg by mouth every morning.      Cyanocobalamin 1000 MCG/ML KIT Inject 1,000 mcg as directed every 30 (thirty) days.     finasteride (PROSCAR) 5 MG tablet Take 5 mg by mouth every morning.      Flaxseed, Linseed, (FLAX SEEDS PO) Take 1 capsule by mouth daily.     hydrochlorothiazide (HYDRODIURIL) 25 MG tablet Take 25 mg by mouth every morning.      isosorbide mononitrate (IMDUR) 30 MG 24 hr tablet Take 1 tablet (30 mg total) by mouth daily. 90 tablet 3   levothyroxine (SYNTHROID, LEVOTHROID) 100 MCG tablet Take 100 mcg by mouth daily before breakfast.     memantine (NAMENDA) 10 MG tablet Take 1 tablet (10 mg total) by mouth 2 (two) times daily. 180 tablet 4   metFORMIN (GLUCOPHAGE) 500  MG tablet Take 500 mg by mouth daily with breakfast.     metoprolol succinate (TOPROL-XL) 25 MG 24 hr tablet TAKE 1 TABLET BY MOUTH ONCE DAILY. PATIENT MUST KEEP UPCOMING APPOINTMENT IN JAN 2023 BEFORE ANYMORE REFILLS (Patient taking differently: Take 25 mg by mouth daily.) 90 tablet 3   nitroGLYCERIN (NITROSTAT) 0.4 MG SL tablet DISSOLVE ONE TABLET UNDER THE TONGUE EVERY 5 MINUTES AS NEEDED FOR CHEST PAIN. 25 tablet 0   potassium chloride SA (K-DUR,KLOR-CON) 20 MEQ tablet Take 10 mEq by mouth every morning.      tamsulosin (FLOMAX) 0.4 MG CAPS capsule Take 0.4 mg by mouth daily.     No current facility-administered medications for this visit.     Review of Systems  Please see the history of present illness.    (+) No chest pain   All other systems reviewed and are otherwise negative except as noted above.  Physical Exam    Wt Readings from Last 3 Encounters:  07/07/22 174 lb 12.8 oz (79.3 kg)  01/29/22 170 lb (77.1 kg)  11/24/21 165 lb (74.8 kg)   [id number] were no vitals filed for this visit.,There is no height or weight on file to calculate BMI.  Constitutional:      Appearance: Healthy appearance. Not in distress.  Neck:      Vascular: JVD normal.  Pulmonary:     Effort: Pulmonary effort is normal.     Breath sounds: No wheezing. No rales. Diminished in the bases Cardiovascular:     Normal rate. Regular rhythm. Normal S1. Normal S2.      Murmurs: There is no murmur.  Edema:    Peripheral edema absent.  Abdominal:     Palpations: Abdomen is soft non tender. There is no hepatomegaly.  Skin:    General: Skin is warm and dry.  Neurological:     General: No focal deficit present.     Mental Status: Alert and oriented to person, place and time.     Cranial Nerves: Cranial nerves are intact.  EKG/LABS/Other Studies Reviewed    ECG personally reviewed by me today -none completed today  Risk Assessment/Calculations:    CHA2DS2-VASc Score = 5   This indicates a 7.2% annual risk of stroke. The patient's score is based upon: CHF History: 0 HTN History: 1 Diabetes History: 1 Stroke History: 0 Vascular Disease History: 1 Age Score: 2 Gender Score: 0      Lab Results  Component Value Date   WBC 6.5 01/29/2022   HGB 12.7 (L) 01/29/2022   HCT 37.2 (L) 01/29/2022   MCV 89.2 01/29/2022   PLT 212 01/29/2022   Lab Results  Component Value Date   CREATININE 0.97 01/29/2022   BUN 23 01/29/2022   NA 139 01/29/2022   K 3.9 01/29/2022   CL 107 01/29/2022   CO2 28 01/29/2022   Lab Results  Component Value Date   ALT 12 07/16/2021   AST 16 07/16/2021   ALKPHOS 80 07/16/2021   BILITOT 0.5 07/16/2021   No results found for: "CHOL", "HDL", "LDLCALC", "LDLDIRECT", "TRIG", "CHOLHDL"  Lab Results  Component Value Date   HGBA1C 5.6 01/29/2022    Assessment & Plan    1.  Coronary artery disease: -s/p CABG x6 in 1998 with diagnostic cath performed in 2014 with clear coronaries and grafts. -Today patient reports no chest pain or anginal equivalent since previous visit -Continue GDMT with Toprol 25 mg daily, and Lipitor 80 mg daily -Patient was advised to  continue Imdur 30 mg with as needed 30 mg for  breakthrough chest pain  2.  Complete heart block: -s/p PPM implant 06/2021 currently followed by Dr. Lovena Le. -Normal device function per [employer]  3.  Exertional angina: -Patient recently started on Imdur 30 mg for complaint of angina.  Patient advised to increase dose to extra 30 mg for breakthrough chest pain. -He was also advised to contact our office if pain increases in intensity and if needed go to the ED for further evaluation -Today patient reports no chest pain or discomfort with activity  4.  Paroxysmal atrial fibrillation: -Patient with history of atrial fibrillation with SVR with RBBB and LAFB -Today patient is rate controlled with Toprol-XL as noted above -Patient denies any occult bleeding with anticoagulation -Continue Eliquis 5 mg twice daily   5.  Type II DM: -Patient's last A1c was 5.6 -Continue current treatment plan as prescribed  Disposition: Follow-up with Sinclair Grooms, MD or APP as scheduled   Medication Adjustments/Labs and Tests Ordered: Current medicines are reviewed at length with the patient today.  Concerns regarding medicines are outlined above.   Signed, Mable Fill, Marissa Nestle, NP 08/06/2022, 7:53 AM [facility]  Note:  This document was prepared using Dragon voice recognition software and may include unintentional dictation errors.

## 2022-08-07 ENCOUNTER — Encounter: Payer: Self-pay | Admitting: Nurse Practitioner

## 2022-08-07 ENCOUNTER — Ambulatory Visit: Payer: PPO | Attending: Nurse Practitioner | Admitting: Nurse Practitioner

## 2022-08-07 VITALS — BP 140/60 | HR 60 | Ht 60.0 in | Wt 181.4 lb

## 2022-08-07 DIAGNOSIS — I359 Nonrheumatic aortic valve disorder, unspecified: Secondary | ICD-10-CM | POA: Diagnosis not present

## 2022-08-07 DIAGNOSIS — I4819 Other persistent atrial fibrillation: Secondary | ICD-10-CM | POA: Diagnosis not present

## 2022-08-07 DIAGNOSIS — R413 Other amnesia: Secondary | ICD-10-CM | POA: Diagnosis not present

## 2022-08-07 DIAGNOSIS — I2089 Other forms of angina pectoris: Secondary | ICD-10-CM | POA: Diagnosis not present

## 2022-08-07 DIAGNOSIS — E1151 Type 2 diabetes mellitus with diabetic peripheral angiopathy without gangrene: Secondary | ICD-10-CM

## 2022-08-07 DIAGNOSIS — Z8679 Personal history of other diseases of the circulatory system: Secondary | ICD-10-CM | POA: Diagnosis not present

## 2022-08-07 DIAGNOSIS — Z950 Presence of cardiac pacemaker: Secondary | ICD-10-CM | POA: Diagnosis not present

## 2022-08-07 DIAGNOSIS — I10 Essential (primary) hypertension: Secondary | ICD-10-CM | POA: Diagnosis not present

## 2022-08-07 DIAGNOSIS — I2510 Atherosclerotic heart disease of native coronary artery without angina pectoris: Secondary | ICD-10-CM | POA: Diagnosis not present

## 2022-08-07 DIAGNOSIS — Z0000 Encounter for general adult medical examination without abnormal findings: Secondary | ICD-10-CM | POA: Diagnosis not present

## 2022-08-07 DIAGNOSIS — E039 Hypothyroidism, unspecified: Secondary | ICD-10-CM | POA: Diagnosis not present

## 2022-08-07 DIAGNOSIS — I25812 Atherosclerosis of bypass graft of coronary artery of transplanted heart without angina pectoris: Secondary | ICD-10-CM

## 2022-08-07 DIAGNOSIS — E538 Deficiency of other specified B group vitamins: Secondary | ICD-10-CM | POA: Diagnosis not present

## 2022-08-07 DIAGNOSIS — I4891 Unspecified atrial fibrillation: Secondary | ICD-10-CM | POA: Diagnosis not present

## 2022-08-07 DIAGNOSIS — I739 Peripheral vascular disease, unspecified: Secondary | ICD-10-CM | POA: Diagnosis not present

## 2022-08-07 DIAGNOSIS — E7800 Pure hypercholesterolemia, unspecified: Secondary | ICD-10-CM | POA: Diagnosis not present

## 2022-08-07 NOTE — Patient Instructions (Signed)
Medication Instructions:   IF  ONGOING CHEST PAIN CAN TAKE AN EXTRA TABLET OF IMDUR ONCE A DAY   *If you need a refill on your cardiac medications before your next appointment, please call your pharmacy*   Lab Work: NONE ORDERED  TODAY    If you have labs (blood work) drawn today and your tests are completely normal, you will receive your results only by: [place] (if you have MyChart) OR A paper copy in the mail If you have any lab test that is abnormal or we need to change your treatment, we will call you to review the results.   Testing/Procedures:,NONE ORDERED  TODAY    Follow-Up: At [facility], you and your health needs are our priority.  As part of our continuing mission to provide you with exceptional heart care, we have created designated Provider Care Teams.  These Care Teams include your primary Cardiologist (physician) and Advanced Practice Providers (APPs -  Physician Assistants and Nurse Practitioners) who all work together to provide you with the care you need, when you need it.  We recommend signing up for the patient portal called "MyChart".  Sign up information is provided on this After Visit Summary.  MyChart is used to connect with patients for Virtual Visits (Telemedicine).  Patients are able to view lab/test results, encounter notes, upcoming appointments, etc.  Non-urgent messages can be sent to your provider as well.   To learn more about what you can do with MyChart, go to [email or web address].    Your next appointment:  AS SCHEDULED   Important Information About Sugar

## 2022-08-10 DIAGNOSIS — E538 Deficiency of other specified B group vitamins: Secondary | ICD-10-CM | POA: Diagnosis not present

## 2022-08-10 DIAGNOSIS — Z23 Encounter for immunization: Secondary | ICD-10-CM | POA: Diagnosis not present

## 2022-08-11 ENCOUNTER — Other Ambulatory Visit: Payer: Self-pay | Admitting: *Deleted

## 2022-08-11 DIAGNOSIS — I739 Peripheral vascular disease, unspecified: Secondary | ICD-10-CM

## 2022-08-11 DIAGNOSIS — I6523 Occlusion and stenosis of bilateral carotid arteries: Secondary | ICD-10-CM

## 2022-08-23 NOTE — Progress Notes (Unsigned)
HISTORY AND PHYSICAL     CC:  follow up. Requesting Provider:  Seward Carol, MD  HPI: This is a 81 y.o. male who is here today for follow up and and was originally seen by Dr. Bridgett Larsson in 2016 and has hx of BLE chronic venous insufficiency, PAD c/w diabetes.   Pt was last seen on 04/23/2021.  At that time, he was continuing to be quite active push mowing his yard.  He was wearing compression socks, which were helping with leg swelling.  His right leg was worse than the left.    The pt returns today for follow up and is here with his wife. He states he is doing well and remains active.  He is still mowing the yard.  His wife states he has not been wearing his compression.    Pt denies any amaurosis fugax, speech difficulties, weakness, numbness, paralysis or clumsiness or facial droop.    Pt denies claudication, rest pain, or non healing wounds.    Pt has hx of CABG x 6 in 1998 with bilateral saphenous vein harvest.  He states he retired and had his open heart surgery shortly after that.  He also has hx of DM, HTN.  A couple of years prior, he had been diagnosed with dementia.  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   The pt is on a statin for cholesterol management.    The pt is not on an aspirin.    Other AC:  Eliquis The pt is on HCTZ, BB for hypertension.  The pt does  have diabetes. Tobacco hx:  former  Pt does not have family hx of AAA.    Past Medical History:  Diagnosis Date   Benign prostatic hypertrophy with lower urinary tract symptoms (LUTS)    Bilateral edema of lower extremity    IMPROVED W/ TED HOSE   CAD (coronary artery disease)    cardiologist-  [name]--  s/p  cabg x6 in 1998   Heart murmur    Hiatal hernia    History of kidney stones    Hydronephrosis, left    Hyperlipidemia    Hypertension    Hypothyroidism    Left ureteral stone    Memory difficulties    Nephrolithiasis    right side per ct  non-obstructive   PVD (peripheral vascular disease) (HCC)     RBBB (right bundle branch block)    S/P CABG x 6    1998   Sigmoid diverticulosis    Sinus bradycardia    Type 2 diabetes mellitus ([place])     Past Surgical History:  Procedure Laterality Date   CATARACT EXTRACTION W/ INTRAOCULAR LENS  IMPLANT, BILATERAL  2011   CORONARY ARTERY BYPASS GRAFT  1998   CABG X 6 --   LIMA  to the LAD and  other undefined SVG's   CYSTOSCOPY WITH BIOPSY N/A 11/29/2015   Procedure: CYSTOSCOPY WITH BIOPSY AND FULGERATION;  Surgeon: [name], MD;  Location: [facility];  Service: Urology;  Laterality: N/A;   CYSTOSCOPY/RETROGRADE/URETEROSCOPY/STONE EXTRACTION WITH BASKET Left 11/29/2015   Procedure: CYSTOSCOPY/RETROGRADE/DIAGNOSTIC URETEROSCOPY/STONE EXTRACTION;  Surgeon: [name], MD;  Location: [facility];  Service: Urology;  Laterality: Left;   EXTRACORPOREAL SHOCK WAVE LITHOTRIPSY Left 08-12-2015   INGUINAL HERNIA REPAIR Right 02/03/2022   Procedure: OPEN RIGHT INGUINAL HERNIA REPAIR WITH MESH;  Surgeon: [name], MD;  Location: [facility];  Service: General;  Laterality: Right;   LEFT HEART CATHETERIZATION  WITH CORONARY/GRAFT ANGIOGRAM  06/16/2013   Procedure: LEFT HEART CATHETERIZATION WITH [name];  Surgeon: Sinclair Grooms, MD;  Location[facility];  Service: Cardiovascular; Native vessel occlusive disease w/ ostial total occlusion RCA, dLM, & heavy calicification LM LAD CX RCA/  SVG to diagonal patent, SVG to marginal 1 patent & 2 occluded, LIMA to dLAD patent/ dCFX collateral from obtuse marginal graft/  normal LVF   PACEMAKER IMPLANT N/A 07/17/2021   Procedure: PACEMAKER IMPLANT;  Surgeon: [name], MD;  Location: [place] [facility];  Service: Cardiovascular;  Laterality: N/A;   TRANSTHORACIC ECHOCARDIOGRAM  05-23-2015  [name]   mild LVH,  grade 1 diastolic dysfunction,  ef 55-060%,  mild AV calcifacation without stenosis,  mild AR and MR,  moderate LAE,  trivial PR and TR,  mild  increase pulmonary pressure    Allergies  Allergen Reactions   Spironolactone Rash   Penicillins Rash    Has patient had a PCN reaction causing immediate rash, facial/tongue/throat swelling, SOB or lightheadedness with hypotension:no Has patient had a PCN reaction causing severe rash involving mucus membranes or skin necrosis: no Has patient had a PCN reaction that required hospitalization no Has patient had a PCN reaction occurring within the last 10 years: no If all of the above answers are "NO", then may proceed with Cephalosporin use.     Current Outpatient Medications  Medication Sig Dispense Refill   acetaminophen (TYLENOL) 325 MG tablet Take 1-2 tablets (325-650 mg total) by mouth every 4 (four) hours as needed for mild pain. 30 tablet 0   apixaban (ELIQUIS) 5 MG TABS tablet Start taking 07/22/21 in the morning 1 tablet (5 mg total) by mouth 2 (two) times daily. 60 tablet 0   atorvastatin (LIPITOR) 80 MG tablet Take 40 mg by mouth every morning.      Cyanocobalamin 1000 MCG/ML KIT Inject 1,000 mcg as directed every 30 (thirty) days.     finasteride (PROSCAR) 5 MG tablet Take 5 mg by mouth every morning.      Flaxseed, Linseed, (FLAX SEEDS PO) Take 1 capsule by mouth daily.     hydrochlorothiazide (HYDRODIURIL) 25 MG tablet Take 25 mg by mouth every morning.      isosorbide mononitrate (IMDUR) 30 MG 24 hr tablet Take 1 tablet (30 mg total) by mouth daily. 90 tablet 3   levothyroxine (SYNTHROID, LEVOTHROID) 100 MCG tablet Take 100 mcg by mouth daily before breakfast.     memantine (NAMENDA) 10 MG tablet Take 1 tablet (10 mg total) by mouth 2 (two) times daily. 180 tablet 4   metFORMIN (GLUCOPHAGE) 500 MG tablet Take 500 mg by mouth daily with breakfast.     metoprolol succinate (TOPROL-XL) 25 MG 24 hr tablet TAKE 1 TABLET BY MOUTH ONCE DAILY. PATIENT MUST KEEP UPCOMING APPOINTMENT IN JAN 2023 BEFORE ANYMORE REFILLS 90 tablet 3   nitroGLYCERIN (NITROSTAT) 0.4 MG SL tablet DISSOLVE ONE  TABLET UNDER THE TONGUE EVERY 5 MINUTES AS NEEDED FOR CHEST PAIN. 25 tablet 0   potassium chloride SA (K-DUR,KLOR-CON) 20 MEQ tablet Take 10 mEq by mouth every morning.      tamsulosin (FLOMAX) 0.4 MG CAPS capsule Take 0.4 mg by mouth daily.     No current facility-administered medications for this visit.    Family History  Problem Relation Age of Onset   Tuberculosis Mother    CAD Father    Heart disease Sister     Social History   Socioeconomic  History   Marital status: Married    Spouse name: [name]   Number of children: 2   Years of education: 12   Highest education level: Not on file  Occupational History    Comment: retired  Tobacco Use   Smoking status: Former    Packs/day: 0.25    Years: 10.00    Total pack years: 2.50    Types: Cigarettes    Quit date: 10/26/1966    Years since quitting: 55.8   Smokeless tobacco: Never  Vaping Use   Vaping Use: Never used  Substance and Sexual Activity   Alcohol use: No    Alcohol/week: 0.0 standard drinks of alcohol   Drug use: No   Sexual activity: Not on file  Other Topics Concern   Not on file  Social History Narrative   06/09/21 lives with wife   Social Determinants of Health   Financial Resource Strain: Not on file  Food Insecurity: Not on file  Transportation Needs: Not on file  Physical Activity: Not on file  Stress: Not on file  Social Connections: Not on file  Intimate Partner Violence: Not on file     REVIEW OF SYSTEMS:   [X] denotes positive finding, [ ] denotes negative finding Cardiac  Comments:  Chest pain or chest pressure:    Shortness of breath upon exertion:    Short of breath when lying flat:    Irregular heart rhythm:        Vascular    Pain in calf, thigh, or hip brought on by ambulation:    Pain in feet at night that wakes you up from your sleep:     Blood clot in your veins:    Leg swelling:  x       Pulmonary    Oxygen at home:    Wheezing:         Neurologic    Sudden  weakness in arms or legs:     Sudden numbness in arms or legs:     Sudden onset of difficulty speaking or understanding others    Temporary loss of vision in one eye:     Problems with dizziness:         Gastrointestinal    Blood in stool:     Vomited blood:         Genitourinary    Burning when urinating:     Blood in urine:        Psychiatric    Major depression:         Hematologic    Bleeding problems:    Problems with blood clotting too easily:        Skin    Rashes or ulcers:        Constitutional    Fever or chills:      PHYSICAL EXAMINATION:  Today's Vitals   08/24/22 1020 08/24/22 1024  BP: 132/68 114/76  Pulse: (!) 59   Resp: 20   Temp: 97.6 F (36.4 C)   TempSrc: Temporal   SpO2: 96%   Weight: 179 lb 4.8 oz (81.3 kg)   Height: 5' (1.524 m)   PainSc: 0-No pain    Body mass index is 35.02 kg/m.   General:  WDWN in NAD; vital signs documented above Gait: Not observed HENT: WNL, normocephalic Pulmonary: normal non-labored breathing  Cardiac: regular HR;  without carotid bruits Abdomen: soft, NT, aortic pulse is not palpable Skin: without rashes Vascular Exam/Pulses:  Right Left  Radial  2+ (normal) 2+ (normal)  AT monophasic absent  PT Brisk biphasic Brisk biphasic  Peroneal absent monophasic   Extremities: well healed saphenectomy scars bilaterally; superficial spider veins present. +BLE edema with R>L Musculoskeletal: no muscle wasting or atrophy  Neurologic: A&O X 3;  speech is fluent/normal; moving all extremities equally  Psychiatric:  The pt has Normal affect.   Non-Invasive Vascular Imaging:   ABI's/TBI's on 08/24/2022: Right:  [place]/0.55 - great toe pressure:  96 Left:  [place]/0.64 - great toe pressure:  110   Non-Invasive Vascular Imaging:   Carotid Duplex on 08/24/2022: Right:  1-39% ICA stenosis Left:  40-59% ICA stenosis  Previous ABI's/TBI's on 04/23/2021: Right:  1.07/0.94 - great toe pressure:  115 Left:  2.09/0.70 - great  toe pressure:  86  Previous Carotid duplex on 04/23/2021: Right: 1-39% ICA stenosis Left:   40-59% ICA stenosis   ASSESSMENT/PLAN:: 81 y.o. male here for follow up for PAD and carotid stenosis    PAD -pt doing well.   -pt does not have rest pain, claudication, non healing wounds. -continue graduated walking program and remaining as active as possible -given he has Henry vessels and his toe pressures have remained stable, we will get ABI in the future as needed.  I discussed with he and his wife that if he develops rest pain, claudication or non healing wounds, we can get ABI in the future.  Instructed him to protect his feet.  -also discussed with him to continue wearing his compression to help with swelling.  Carotid stenosis -duplex today reveals 1-39% stenosis on the right and 40-59% stenosis in the left and this is essentially unchanged.  He remains asymptomatic.   -discussed s/s of stroke with pt and he understands should he develop any of these sx, he will go to the nearest ER or call 911. -pt will f/u in one year with carotid duplex  -continue statin/Eliquis   Leontine Locket, [facility] [phone]  Clinic MD:   Trula Slade

## 2022-08-24 ENCOUNTER — Ambulatory Visit (INDEPENDENT_AMBULATORY_CARE_PROVIDER_SITE_OTHER)
Admission: RE | Admit: 2022-08-24 | Discharge: 2022-08-24 | Disposition: A | Discharge status: Home / Self Care | Payer: PPO | Source: Ambulatory Visit | Attending: Surgery | Admitting: Surgery

## 2022-08-24 ENCOUNTER — Ambulatory Visit: Payer: PPO | Admitting: Physician Assistant

## 2022-08-24 ENCOUNTER — Ambulatory Visit (HOSPITAL_COMMUNITY)
Admission: RE | Admit: 2022-08-24 | Discharge: 2022-08-24 | Disposition: A | Discharge status: Home / Self Care | Payer: PPO | Source: Ambulatory Visit | Attending: Surgery | Admitting: Surgery

## 2022-08-24 VITALS — BP 114/76 | HR 59 | Temp 97.6°F | Resp 20 | Ht 60.0 in | Wt 179.3 lb

## 2022-08-24 DIAGNOSIS — I6523 Occlusion and stenosis of bilateral carotid arteries: Secondary | ICD-10-CM

## 2022-08-24 DIAGNOSIS — I739 Peripheral vascular disease, unspecified: Secondary | ICD-10-CM

## 2022-09-10 DIAGNOSIS — E538 Deficiency of other specified B group vitamins: Secondary | ICD-10-CM | POA: Diagnosis not present

## 2022-10-11 NOTE — Progress Notes (Unsigned)
Cardiology Office Note:    Date:  10/13/2022   ID:  [name] [name], DOB [birth year], MRN [id number]  PCP:  Seward Carol, MD  Cardiologist:  Sinclair Grooms, MD   Referring MD: Seward Carol, MD   Chief Complaint  Patient presents with   Coronary Artery Disease    History of Present Illness:    [name] [name] is a 81 y.o. male with a hx of CAD s/p CABG x6 in 1998, DM type II, RBBB, PVD, hiatal hernia, HLD, HTN, BPH, PAF and CHB s/p Medtronic PPM 06/2021.  In August, Imdur started b/o exertion related CP. Since starting therapy angina has not recurred.  He is doing well.  No recurrence of angina since adding isosorbide.  Past Medical History:  Diagnosis Date   Benign prostatic hypertrophy with lower urinary tract symptoms (LUTS)    Bilateral edema of lower extremity    IMPROVED W/ TED HOSE   CAD (coronary artery disease)    cardiologist-  [name]--  s/p  cabg x6 in 1998   Heart murmur    Hiatal hernia    History of kidney stones    Hydronephrosis, left    Hyperlipidemia    Hypertension    Hypothyroidism    Left ureteral stone    Memory difficulties    Nephrolithiasis    right side per ct  non-obstructive   PVD (peripheral vascular disease) (HCC)    RBBB (right bundle branch block)    S/P CABG x 6    1998   Sigmoid diverticulosis    Sinus bradycardia    Type 2 diabetes mellitus ([place])     Past Surgical History:  Procedure Laterality Date   CATARACT EXTRACTION W/ INTRAOCULAR LENS  IMPLANT, BILATERAL  2011   CORONARY ARTERY BYPASS GRAFT  1998   CABG X 6 --   LIMA  to the LAD and  other undefined SVG's   CYSTOSCOPY WITH BIOPSY N/A 11/29/2015   Procedure: CYSTOSCOPY WITH BIOPSY AND FULGERATION;  Surgeon: [name], MD;  Location: [facility];  Service: Urology;  Laterality: N/A;   CYSTOSCOPY/RETROGRADE/URETEROSCOPY/STONE EXTRACTION WITH BASKET Left 11/29/2015   Procedure: CYSTOSCOPY/RETROGRADE/DIAGNOSTIC URETEROSCOPY/STONE EXTRACTION;   Surgeon: [name], MD;  Location: [facility];  Service: Urology;  Laterality: Left;   EXTRACORPOREAL SHOCK WAVE LITHOTRIPSY Left 08-12-2015   INGUINAL HERNIA REPAIR Right 02/03/2022   Procedure: OPEN RIGHT INGUINAL HERNIA REPAIR WITH MESH;  Surgeon: [name], MD;  Location: [facility];  Service: General;  Laterality: Right;   LEFT HEART CATHETERIZATION WITH CORONARY/GRAFT ANGIOGRAM  06/16/2013   Procedure: LEFT HEART CATHETERIZATION WITH [name];  Surgeon: Sinclair Grooms, MD;  Location: [facility] CATH LAB;  Service: Cardiovascular; Native vessel occlusive disease w/ ostial total occlusion RCA, dLM, & heavy calicification LM LAD CX RCA/  SVG to diagonal patent, SVG to marginal 1 patent & 2 occluded, LIMA to dLAD patent/ dCFX collateral from obtuse marginal graft/  normal LVF   PACEMAKER IMPLANT N/A 07/17/2021   Procedure: PACEMAKER IMPLANT;  Surgeon: [name], MD;  Location: [place] [facility];  Service: Cardiovascular;  Laterality: N/A;   TRANSTHORACIC ECHOCARDIOGRAM  05-23-2015  [name]   mild LVH,  grade 1 diastolic dysfunction,  ef 55-060%,  mild AV calcifacation without stenosis,  mild AR and MR,  moderate LAE,  trivial PR and TR,  mild increase pulmonary pressure    Current Medications: Current Meds  Medication Sig  acetaminophen (TYLENOL) 325 MG tablet Take 1-2 tablets (325-650 mg total) by mouth every 4 (four) hours as needed for mild pain.   amLODipine (NORVASC) 5 MG tablet SMARTSIG:1 Tablet(s) By Mouth Every Evening   apixaban (ELIQUIS) 5 MG TABS tablet Start taking 07/22/21 in the morning 1 tablet (5 mg total) by mouth 2 (two) times daily.   atorvastatin (LIPITOR) 80 MG tablet Take 40 mg by mouth every morning.    Cyanocobalamin 1000 MCG/ML KIT Inject 1,000 mcg as directed every 30 (thirty) days.   finasteride (PROSCAR) 5 MG tablet Take 5 mg by mouth every morning.    Flaxseed, Linseed, (FLAX SEEDS PO) Take 1 capsule by mouth daily.    hydrochlorothiazide (HYDRODIURIL) 25 MG tablet Take 25 mg by mouth every morning.    isosorbide mononitrate (IMDUR) 30 MG 24 hr tablet Take 1 tablet (30 mg total) by mouth daily.   levothyroxine (SYNTHROID, LEVOTHROID) 100 MCG tablet Take 100 mcg by mouth daily before breakfast.   memantine (NAMENDA) 10 MG tablet Take 1 tablet (10 mg total) by mouth 2 (two) times daily.   metFORMIN (GLUCOPHAGE) 500 MG tablet Take 500 mg by mouth daily with breakfast.   metoprolol succinate (TOPROL-XL) 25 MG 24 hr tablet TAKE 1 TABLET BY MOUTH ONCE DAILY. PATIENT MUST KEEP UPCOMING APPOINTMENT IN JAN 2023 BEFORE ANYMORE REFILLS   nitroGLYCERIN (NITROSTAT) 0.4 MG SL tablet DISSOLVE ONE TABLET UNDER THE TONGUE EVERY 5 MINUTES AS NEEDED FOR CHEST PAIN.   potassium chloride SA (K-DUR,KLOR-CON) 20 MEQ tablet Take 10 mEq by mouth every morning.    tamsulosin (FLOMAX) 0.4 MG CAPS capsule Take 0.4 mg by mouth daily.     Allergies:   Spironolactone and Penicillins   Social History   Socioeconomic History   Marital status: Married    Spouse name: [name]   Number of children: 2   Years of education: 12   Highest education level: Not on file  Occupational History    Comment: retired  Tobacco Use   Smoking status: Former    Packs/day: 0.25    Years: 10.00    Total pack years: 2.50    Types: Cigarettes    Quit date: 10/26/1966    Years since quitting: 56.0    Passive exposure: Never   Smokeless tobacco: Never  Vaping Use   Vaping Use: Never used  Substance and Sexual Activity   Alcohol use: No    Alcohol/week: 0.0 standard drinks of alcohol   Drug use: No   Sexual activity: Not on file  Other Topics Concern   Not on file  Social History Narrative   06/09/21 lives with wife   Social Determinants of Health   Financial Resource Strain: Not on file  Food Insecurity: Not on file  Transportation Needs: Not on file  Physical Activity: Not on file  Stress: Not on file  Social Connections: Not on file      Family History: The patient's family history includes CAD in his father; Heart disease in his sister; Tuberculosis in his mother.  ROS:   Please see the history of present illness.    Been physically active.  Notices some decrease in memory.  All other systems reviewed and are negative.  EKGs/Labs/Other Studies Reviewed:    The following studies were reviewed today:  Echocardiogram 2022: IMPRESSIONS   1. Left ventricular ejection fraction, by estimation, is 50 to 55%. The  left ventricle has low normal function. The left ventricle has no regional  wall motion  abnormalities. There is mild left ventricular hypertrophy.  Left ventricular diastolic  parameters are indeterminate.   2. Right ventricular systolic function is normal. The right ventricular  size is normal. Tricuspid regurgitation signal is inadequate for assessing  PA pressure.   3. Left atrial size was moderately dilated.   4. Right atrial size was mildly dilated.   5. The mitral valve is grossly normal. Mild mitral valve regurgitation.  No evidence of mitral stenosis.   6. The aortic valve is grossly normal. There is moderate calcification of  the aortic valve. Aortic valve regurgitation is trivial. Mild aortic valve  sclerosis is present, with no evidence of aortic valve stenosis.   7. Aortic dilatation noted. There is mild dilatation of the aortic root,  measuring 41 mm.      EKG:  EKG not repeated at EKG from September demonstrated AV sequential pacing.  Recent Labs: 01/29/2022: BUN 23; Creatinine, Ser 0.97; Hemoglobin 12.7; Platelets 212; Potassium 3.9; Sodium 139  Recent Lipid Panel No results found for: "CHOL", "TRIG", "HDL", "CHOLHDL", "VLDL", "LDLCALC", "LDLDIRECT"  Physical Exam:    VS:  BP 132/70   Pulse 60   Ht 5' (1.524 m)   Wt 183 lb 6.4 oz (83.2 kg)   SpO2 95%   BMI 35.82 kg/m     Wt Readings from Last 3 Encounters:  10/13/22 183 lb 6.4 oz (83.2 kg)  08/24/22 179 lb 4.8 oz (81.3 kg)   08/07/22 181 lb 6.4 oz (82.3 kg)     GEN: Compatible with age and slightly overweight. No acute distress HEENT: Normal NECK: No JVD. LYMPHATICS: No lymphadenopathy CARDIAC: 2/6 right upper sternal systolic murmur. RRR S4 gallop, or edema. VASCULAR:  Normal Pulses. No bruits. RESPIRATORY:  Clear to auscultation without rales, wheezing or rhonchi  ABDOMEN: Soft, non-tender, non-distended, No pulsatile mass, MUSCULOSKELETAL: No deformity  SKIN: Warm and dry NEUROLOGIC:  Alert and oriented x 3 PSYCHIATRIC:  Normal affect   ASSESSMENT:    1. Coronary artery disease involving coronary bypass graft of native heart without angina pectoris   2. Persistent atrial fibrillation ([place])   3. Peripheral arterial disease ([place])   4. Right bundle branch block   5. Type 2 diabetes mellitus with peripheral artery disease ([place])   6. Pacemaker   7. Aortic valve sclerosis   8. Aortic dilatation (HCC)    PLAN:    In order of problems listed above:  Continue secondary prevention.  Use sublingual nitroglycerin as needed if recurrent chest pain.  Notify us if this becomes frequent. Has not recurred.  Clinically in sinus rhythm today.  Last EKG showed AV sequential pacing. Denies claudication Discuss Continue guideline directed therapy to keep A1c less than 7 Permanent pacemaker followed up in clinic Clinical exam is consistent with aortic valve sclerosis. Mildly dilated ascending aorta on echo done in 2022 will need to be followed up   Medication Adjustments/Labs and Tests Ordered: Current medicines are reviewed at length with the patient today.  Concerns regarding medicines are outlined above.  No orders of the defined types were placed in this encounter.  No orders of the defined types were placed in this encounter.   Patient Instructions  Medication Instructions:  Your physician recommends that you continue on your current medications as directed. Please refer to the Current Medication  list given to you today.  *If you need a refill on your cardiac medications before your next appointment, please call your pharmacy*  Follow-Up: At [facility], you and  your health needs are our priority.  As part of our continuing mission to provide you with exceptional heart care, we have created designated Provider Care Teams.  These Care Teams include your primary Cardiologist (physician) and Advanced Practice Providers (APPs -  Physician Assistants and Nurse Practitioners) who all work together to provide you with the care you need, when you need it.  Your next appointment:   6-8 month(s)  The format for your next appointment:   In Person  Provider:   Candee Furbish, MD or Rudean Haskell, MD or Gwyndolyn Kaufman, MD or Richardson Dopp, PA-C   Important Information About Sugar         Signed, Sinclair Grooms, MD  10/13/2022 11:02 AM    [place]

## 2022-10-12 DIAGNOSIS — E538 Deficiency of other specified B group vitamins: Secondary | ICD-10-CM | POA: Diagnosis not present

## 2022-10-13 ENCOUNTER — Ambulatory Visit: Payer: PPO | Attending: Interventional Cardiology | Admitting: Interventional Cardiology

## 2022-10-13 ENCOUNTER — Encounter: Payer: Self-pay | Admitting: Interventional Cardiology

## 2022-10-13 VITALS — BP 132/70 | HR 60 | Ht 60.0 in | Wt 183.4 lb

## 2022-10-13 DIAGNOSIS — I77819 Aortic ectasia, unspecified site: Secondary | ICD-10-CM | POA: Diagnosis not present

## 2022-10-13 DIAGNOSIS — I358 Other nonrheumatic aortic valve disorders: Secondary | ICD-10-CM | POA: Diagnosis not present

## 2022-10-13 DIAGNOSIS — I4819 Other persistent atrial fibrillation: Secondary | ICD-10-CM

## 2022-10-13 DIAGNOSIS — Z950 Presence of cardiac pacemaker: Secondary | ICD-10-CM

## 2022-10-13 DIAGNOSIS — I25810 Atherosclerosis of coronary artery bypass graft(s) without angina pectoris: Secondary | ICD-10-CM

## 2022-10-13 DIAGNOSIS — I739 Peripheral vascular disease, unspecified: Secondary | ICD-10-CM

## 2022-10-13 DIAGNOSIS — E1151 Type 2 diabetes mellitus with diabetic peripheral angiopathy without gangrene: Secondary | ICD-10-CM | POA: Diagnosis not present

## 2022-10-13 DIAGNOSIS — I4510 Unspecified right bundle-branch block: Secondary | ICD-10-CM | POA: Diagnosis not present

## 2022-10-13 NOTE — Patient Instructions (Signed)
Medication Instructions:  Your physician recommends that you continue on your current medications as directed. Please refer to the Current Medication list given to you today.  *If you need a refill on your cardiac medications before your next appointment, please call your pharmacy*  Follow-Up: At [facility], you and your health needs are our priority.  As part of our continuing mission to provide you with exceptional heart care, we have created designated Provider Care Teams.  These Care Teams include your primary Cardiologist (physician) and Advanced Practice Providers (APPs -  Physician Assistants and Nurse Practitioners) who all work together to provide you with the care you need, when you need it.  Your next appointment:   6-8 month(s)  The format for your next appointment:   In Person  Provider:   Donato Schultz, MD or [name], MD or Laurance Flatten, MD or Tereso Newcomer, PA-C   Important Information About Sugar

## 2022-10-20 ENCOUNTER — Ambulatory Visit (INDEPENDENT_AMBULATORY_CARE_PROVIDER_SITE_OTHER): Payer: PPO

## 2022-10-20 DIAGNOSIS — I4510 Unspecified right bundle-branch block: Secondary | ICD-10-CM | POA: Diagnosis not present

## 2022-10-20 LAB — CUP PACEART REMOTE DEVICE CHECK
Battery Remaining Longevity: 121 mo
Battery Voltage: 3.02 V
Brady Statistic AP VP Percent: 85.5 %
Brady Statistic AP VS Percent: 0.28 %
Brady Statistic AS VP Percent: 12.64 %
Brady Statistic AS VS Percent: 1.58 %
Brady Statistic RA Percent Paced: 86.49 %
Brady Statistic RV Percent Paced: 98.15 %
Date Time Interrogation Session: 20231226003119
Implantable Lead Connection Status: 753985
Implantable Lead Connection Status: 753985
Implantable Lead Implant Date: 20220922
Implantable Lead Implant Date: 20220922
Implantable Lead Location: 753859
Implantable Lead Location: 753860
Implantable Lead Model: 3830
Implantable Lead Model: 5076
Implantable Pulse Generator Implant Date: 20220922
Lead Channel Impedance Value: 323 Ohm
Lead Channel Impedance Value: 323 Ohm
Lead Channel Impedance Value: 475 Ohm
Lead Channel Impedance Value: 475 Ohm
Lead Channel Pacing Threshold Amplitude: 0.75 V
Lead Channel Pacing Threshold Amplitude: 1.25 V
Lead Channel Pacing Threshold Pulse Width: 0.4 ms
Lead Channel Pacing Threshold Pulse Width: 0.4 ms
Lead Channel Sensing Intrinsic Amplitude: 10.375 mV
Lead Channel Sensing Intrinsic Amplitude: 10.375 mV
Lead Channel Sensing Intrinsic Amplitude: 3.25 mV
Lead Channel Sensing Intrinsic Amplitude: 3.25 mV
Lead Channel Setting Pacing Amplitude: 1.5 V
Lead Channel Setting Pacing Amplitude: 2.5 V
Lead Channel Setting Pacing Pulse Width: 0.4 ms
Lead Channel Setting Sensing Sensitivity: 0.9 mV
Zone Setting Status: 755011
Zone Setting Status: 755011

## 2022-10-28 ENCOUNTER — Other Ambulatory Visit: Payer: Self-pay | Admitting: Diagnostic Neuroimaging

## 2022-11-10 DIAGNOSIS — N3000 Acute cystitis without hematuria: Secondary | ICD-10-CM | POA: Diagnosis not present

## 2022-11-10 DIAGNOSIS — N200 Calculus of kidney: Secondary | ICD-10-CM | POA: Diagnosis not present

## 2022-11-10 DIAGNOSIS — R338 Other retention of urine: Secondary | ICD-10-CM | POA: Diagnosis not present

## 2022-11-11 NOTE — Progress Notes (Signed)
Remote pacemaker transmission.   

## 2022-11-12 DIAGNOSIS — E538 Deficiency of other specified B group vitamins: Secondary | ICD-10-CM | POA: Diagnosis not present

## 2022-11-24 ENCOUNTER — Ambulatory Visit: Payer: PPO | Attending: Internal Medicine | Admitting: Internal Medicine

## 2022-11-24 ENCOUNTER — Ambulatory Visit: Payer: PPO | Admitting: Cardiology

## 2022-11-24 ENCOUNTER — Encounter: Payer: Self-pay | Admitting: Internal Medicine

## 2022-11-24 VITALS — BP 122/70 | HR 74 | Ht 65.0 in | Wt 174.0 lb

## 2022-11-24 DIAGNOSIS — I480 Paroxysmal atrial fibrillation: Secondary | ICD-10-CM | POA: Diagnosis not present

## 2022-11-24 DIAGNOSIS — I459 Conduction disorder, unspecified: Secondary | ICD-10-CM | POA: Insufficient documentation

## 2022-11-24 DIAGNOSIS — Z950 Presence of cardiac pacemaker: Secondary | ICD-10-CM | POA: Diagnosis not present

## 2022-11-24 NOTE — Patient Instructions (Addendum)
Medication Instructions:  Your physician recommends that you continue on your current medications as directed. Please refer to the Current Medication list given to you today.  *If you need a refill on your cardiac medications before your next appointment, please call your pharmacy*  Lab Work: None ordered.  If you have labs (blood work) drawn today and your tests are completely normal, you will receive your results only by: MyChart Message (if you have MyChart) OR A paper copy in the mail If you have any lab test that is abnormal or we need to change your treatment, we will call you to review the results.  Testing/Procedures: None ordered.  Follow-Up: At [facility], you and your health needs are our priority.  As part of our continuing mission to provide you with exceptional heart care, we have created designated Provider Care Teams.  These Care Teams include your primary Cardiologist (physician) and Advanced Practice Providers (APPs -  Physician Assistants and Nurse Practitioners) who all work together to provide you with the care you need, when you need it.  We recommend signing up for the patient portal called "MyChart".  Sign up information is provided on this After Visit Summary.  MyChart is used to connect with patients for Virtual Visits (Telemedicine).  Patients are able to view lab/test results, encounter notes, upcoming appointments, etc.  Non-urgent messages can be sent to your provider as well.   To learn more about what you can do with MyChart, go to [email or web address].    Your next appointment:   1 year(s)  The format for your next appointment:   In Person  Provider:   Gregg Taylor, MD{or one of the following Advanced Practice Providers on your designated Care Team:   Renee Ursuy, PA-C Michael "Andy" Tillery, PA-C  Remote monitoring is used to monitor your Pacemaker from home. This monitoring reduces the number of office visits required to check your device to  one time per year. It allows us to keep an eye on the functioning of your device to ensure it is working properly. You are scheduled for a device check from home on 01/19/23. You may send your transmission at any time that day. If you have a wireless device, the transmission will be sent automatically. After your physician reviews your transmission, you will receive a postcard with your next transmission date.  Important Information About Sugar       

## 2022-11-24 NOTE — Progress Notes (Signed)
HPI Mr. Alcindor returns today for followup of syncope. He is a pleasant 81 yo man with CAD s/p CABG, HTN, and dyslipidemia who developed atrial fib and CHB and underwent PPM insertion over a year ago. In the interim, he has done well with no palpitations or syncope. He denies chest pain or sob.  He admits to being more sedentary.  Allergies  Allergen Reactions   Spironolactone Rash   Penicillins Rash    Has patient had a PCN reaction causing immediate rash, facial/tongue/throat swelling, SOB or lightheadedness with hypotension:no Has patient had a PCN reaction causing severe rash involving mucus membranes or skin necrosis: no Has patient had a PCN reaction that required hospitalization no Has patient had a PCN reaction occurring within the last 10 years: no If all of the above answers are "NO", then may proceed with Cephalosporin use.      Current Outpatient Medications  Medication Sig Dispense Refill   acetaminophen (TYLENOL) 325 MG tablet Take 1-2 tablets (325-650 mg total) by mouth every 4 (four) hours as needed for mild pain. 30 tablet 0   amLODipine (NORVASC) 5 MG tablet SMARTSIG:1 Tablet(s) By Mouth Every Evening     apixaban (ELIQUIS) 5 MG TABS tablet Start taking 07/22/21 in the morning 1 tablet (5 mg total) by mouth 2 (two) times daily. 60 tablet 0   atorvastatin (LIPITOR) 80 MG tablet Take 40 mg by mouth every morning.      Cyanocobalamin 1000 MCG/ML KIT Inject 1,000 mcg as directed every 30 (thirty) days.     finasteride (PROSCAR) 5 MG tablet Take 5 mg by mouth every morning.      Flaxseed, Linseed, (FLAX SEEDS PO) Take 1 capsule by mouth daily.     hydrochlorothiazide (HYDRODIURIL) 25 MG tablet Take 25 mg by mouth every morning.      isosorbide mononitrate (IMDUR) 30 MG 24 hr tablet Take 1 tablet (30 mg total) by mouth daily. 90 tablet 3   levothyroxine (SYNTHROID, LEVOTHROID) 100 MCG tablet Take 100 mcg by mouth daily before breakfast.     memantine (NAMENDA) 10 MG  tablet Take 1 tablet by mouth twice daily 180 tablet 0   metFORMIN (GLUCOPHAGE) 500 MG tablet Take 500 mg by mouth daily with breakfast.     metoprolol succinate (TOPROL-XL) 25 MG 24 hr tablet TAKE 1 TABLET BY MOUTH ONCE DAILY. PATIENT MUST KEEP UPCOMING APPOINTMENT IN JAN 2023 BEFORE ANYMORE REFILLS 90 tablet 3   nitroGLYCERIN (NITROSTAT) 0.4 MG SL tablet DISSOLVE ONE TABLET UNDER THE TONGUE EVERY 5 MINUTES AS NEEDED FOR CHEST PAIN. 25 tablet 0   potassium chloride SA (K-DUR,KLOR-CON) 20 MEQ tablet Take 10 mEq by mouth every morning.      tamsulosin (FLOMAX) 0.4 MG CAPS capsule Take 0.4 mg by mouth daily.     No current facility-administered medications for this visit.     Past Medical History:  Diagnosis Date   Benign prostatic hypertrophy with lower urinary tract symptoms (LUTS)    Bilateral edema of lower extremity    IMPROVED W/ TED HOSE   CAD (coronary artery disease)    cardiologist-  [name]--  s/p  cabg x6 in 1998   Heart murmur    Hiatal hernia    History of kidney stones    Hydronephrosis, left    Hyperlipidemia    Hypertension    Hypothyroidism    Left ureteral stone    Memory difficulties    Nephrolithiasis  right side per ct  non-obstructive   PVD (peripheral vascular disease) (HCC)    RBBB (right bundle branch block)    S/P CABG x 6    1998   Sigmoid diverticulosis    Sinus bradycardia    Type 2 diabetes mellitus (HCC)     ROS:   All systems reviewed and negative except as noted in the HPI.   Past Surgical History:  Procedure Laterality Date   CATARACT EXTRACTION W/ INTRAOCULAR LENS  IMPLANT, BILATERAL  2011   CORONARY ARTERY BYPASS GRAFT  1998   CABG X 6 --   LIMA  to the LAD and  other undefined SVG's   CYSTOSCOPY WITH BIOPSY N/A 11/29/2015   Procedure: CYSTOSCOPY WITH BIOPSY AND FULGERATION;  Surgeon: [name], MD;  Location: [facility];  Service: Urology;  Laterality: N/A;   CYSTOSCOPY/RETROGRADE/URETEROSCOPY/STONE  EXTRACTION WITH BASKET Left 11/29/2015   Procedure: CYSTOSCOPY/RETROGRADE/DIAGNOSTIC URETEROSCOPY/STONE EXTRACTION;  Surgeon: [name], MD;  Location: [facility];  Service: Urology;  Laterality: Left;   EXTRACORPOREAL SHOCK WAVE LITHOTRIPSY Left 08-12-2015   INGUINAL HERNIA REPAIR Right 02/03/2022   Procedure: OPEN RIGHT INGUINAL HERNIA REPAIR WITH MESH;  Surgeon: [name], MD;  Location: [facility];  Service: General;  Laterality: Right;   LEFT HEART CATHETERIZATION WITH CORONARY/GRAFT ANGIOGRAM  06/16/2013   Procedure: LEFT HEART CATHETERIZATION WITH [name];  Surgeon: Sinclair Grooms, MD;  Location: [facility];  Service: Cardiovascular; Native vessel occlusive disease w/ ostial total occlusion RCA, dLM, & heavy calicification LM LAD CX RCA/  SVG to diagonal patent, SVG to marginal 1 patent & 2 occluded, LIMA to dLAD patent/ dCFX collateral from obtuse marginal graft/  normal LVF   PACEMAKER IMPLANT N/A 07/17/2021   Procedure: PACEMAKER IMPLANT;  Surgeon: [name], MD;  Location: [place] [facility];  Service: Cardiovascular;  Laterality: N/A;   TRANSTHORACIC ECHOCARDIOGRAM  05-23-2015  [name]   mild LVH,  grade 1 diastolic dysfunction,  ef 55-060%,  mild AV calcifacation without stenosis,  mild AR and MR,  moderate LAE,  trivial PR and TR,  mild increase pulmonary pressure     Family History  Problem Relation Age of Onset   Tuberculosis Mother    CAD Father    Heart disease Sister      Social History   Socioeconomic History   Marital status: Married    Spouse name: [name]   Number of children: 2   Years of education: 12   Highest education level: Not on file  Occupational History    Comment: retired  Tobacco Use   Smoking status: Former    Packs/day: 0.25    Years: 10.00    Total pack years: 2.50    Types: Cigarettes    Quit date: 10/26/1966    Years since quitting: 56.1    Passive exposure: Never   Smokeless tobacco:  Never  Vaping Use   Vaping Use: Never used  Substance and Sexual Activity   Alcohol use: No    Alcohol/week: 0.0 standard drinks of alcohol   Drug use: No   Sexual activity: Not on file  Other Topics Concern   Not on file  Social History Narrative   06/09/21 lives with wife   Social Determinants of Health   Financial Resource Strain: Not on file  Food Insecurity: Not on file  Transportation Needs: Not on file  Physical Activity: Not on file  Stress: Not on file  Social Connections: Not on file  Intimate Partner Violence: Not on file     BP 122/70   Pulse 74   Ht 5\' 5"  (1.651 m)   Wt 174 lb (78.9 kg)   SpO2 98%   BMI 28.96 kg/m   Physical Exam:  Well appearing NAD HEENT: Unremarkable Neck:  No JVD, no thyromegally Lymphatics:  No adenopathy Back:  No CVA tenderness Lungs:  Clear with no wheezes HEART:  Regular rate rhythm, no murmurs, no rubs, no clicks Abd:  soft, positive bowel sounds, no organomegally, no rebound, no guarding Ext:  2 plus pulses, no edema, no cyanosis, no clubbing Skin:  No rashes no nodules Neuro:  CN II through XII intact, motor grossly intact  DEVICE  Normal device function.  See PaceArt for details.   Assess/Plan:  Stokes Adams attacks - he has been asymptomatic s/p PPM insertion. PPM - his medtronic DDD PM is working normally. PAF - he has not had more atrial fib since his PPM was inserted. He will continue his blood thinner. CAD - he is s/p remote CABG and is asymptomatic.    Carleene Overlie Tremont Gavitt,MD

## 2022-12-02 ENCOUNTER — Other Ambulatory Visit: Payer: Self-pay

## 2022-12-02 MED ORDER — METOPROLOL SUCCINATE ER 25 MG PO TB24: ORAL_TABLET | ORAL | 3 refills | Status: DC

## 2022-12-14 DIAGNOSIS — E538 Deficiency of other specified B group vitamins: Secondary | ICD-10-CM | POA: Diagnosis not present

## 2023-01-14 DIAGNOSIS — E538 Deficiency of other specified B group vitamins: Secondary | ICD-10-CM | POA: Diagnosis not present

## 2023-01-19 ENCOUNTER — Ambulatory Visit (INDEPENDENT_AMBULATORY_CARE_PROVIDER_SITE_OTHER): Payer: PPO

## 2023-01-19 DIAGNOSIS — I4510 Unspecified right bundle-branch block: Secondary | ICD-10-CM | POA: Diagnosis not present

## 2023-01-19 LAB — CUP PACEART REMOTE DEVICE CHECK
Battery Remaining Longevity: 111 mo
Battery Voltage: 3.01 V
Brady Statistic AP VP Percent: 87.77 %
Brady Statistic AP VS Percent: 0.49 %
Brady Statistic AS VP Percent: 6.37 %
Brady Statistic AS VS Percent: 5.37 %
Brady Statistic RA Percent Paced: 91.86 %
Brady Statistic RV Percent Paced: 94.14 %
Date Time Interrogation Session: 20240326031058
Implantable Lead Connection Status: 753985
Implantable Lead Connection Status: 753985
Implantable Lead Implant Date: 20220922
Implantable Lead Implant Date: 20220922
Implantable Lead Location: 753859
Implantable Lead Location: 753860
Implantable Lead Model: 3830
Implantable Lead Model: 5076
Implantable Pulse Generator Implant Date: 20220922
Lead Channel Impedance Value: 323 Ohm
Lead Channel Impedance Value: 342 Ohm
Lead Channel Impedance Value: 475 Ohm
Lead Channel Impedance Value: 513 Ohm
Lead Channel Pacing Threshold Amplitude: 0.625 V
Lead Channel Pacing Threshold Amplitude: 1.125 V
Lead Channel Pacing Threshold Pulse Width: 0.4 ms
Lead Channel Pacing Threshold Pulse Width: 0.4 ms
Lead Channel Sensing Intrinsic Amplitude: 11.875 mV
Lead Channel Sensing Intrinsic Amplitude: 11.875 mV
Lead Channel Sensing Intrinsic Amplitude: 3.25 mV
Lead Channel Sensing Intrinsic Amplitude: 3.25 mV
Lead Channel Setting Pacing Amplitude: 1.5 V
Lead Channel Setting Pacing Amplitude: 2.75 V
Lead Channel Setting Pacing Pulse Width: 0.4 ms
Lead Channel Setting Sensing Sensitivity: 0.9 mV
Zone Setting Status: 755011
Zone Setting Status: 755011

## 2023-02-05 DIAGNOSIS — E538 Deficiency of other specified B group vitamins: Secondary | ICD-10-CM | POA: Diagnosis not present

## 2023-02-05 DIAGNOSIS — Z950 Presence of cardiac pacemaker: Secondary | ICD-10-CM | POA: Diagnosis not present

## 2023-02-05 DIAGNOSIS — E1151 Type 2 diabetes mellitus with diabetic peripheral angiopathy without gangrene: Secondary | ICD-10-CM | POA: Diagnosis not present

## 2023-02-05 DIAGNOSIS — I10 Essential (primary) hypertension: Secondary | ICD-10-CM | POA: Diagnosis not present

## 2023-02-05 DIAGNOSIS — I359 Nonrheumatic aortic valve disorder, unspecified: Secondary | ICD-10-CM | POA: Diagnosis not present

## 2023-02-05 DIAGNOSIS — I4891 Unspecified atrial fibrillation: Secondary | ICD-10-CM | POA: Diagnosis not present

## 2023-02-05 DIAGNOSIS — E039 Hypothyroidism, unspecified: Secondary | ICD-10-CM | POA: Diagnosis not present

## 2023-02-05 DIAGNOSIS — E7800 Pure hypercholesterolemia, unspecified: Secondary | ICD-10-CM | POA: Diagnosis not present

## 2023-02-05 DIAGNOSIS — R413 Other amnesia: Secondary | ICD-10-CM | POA: Diagnosis not present

## 2023-02-05 DIAGNOSIS — I2510 Atherosclerotic heart disease of native coronary artery without angina pectoris: Secondary | ICD-10-CM | POA: Diagnosis not present

## 2023-02-15 DIAGNOSIS — E538 Deficiency of other specified B group vitamins: Secondary | ICD-10-CM | POA: Diagnosis not present

## 2023-03-02 NOTE — Progress Notes (Signed)
Remote pacemaker transmission.   

## 2023-03-18 DIAGNOSIS — E538 Deficiency of other specified B group vitamins: Secondary | ICD-10-CM | POA: Diagnosis not present

## 2023-03-18 IMAGING — CR DG CHEST 2V
3 series · 3 of 3 positions shown · non-contrast
Comparison: Chest x-ray 07/16/2021.

CLINICAL DATA: Pacemaker.

EXAM:
CHEST - 2 VIEW

[w chest lat (1 of 2)]
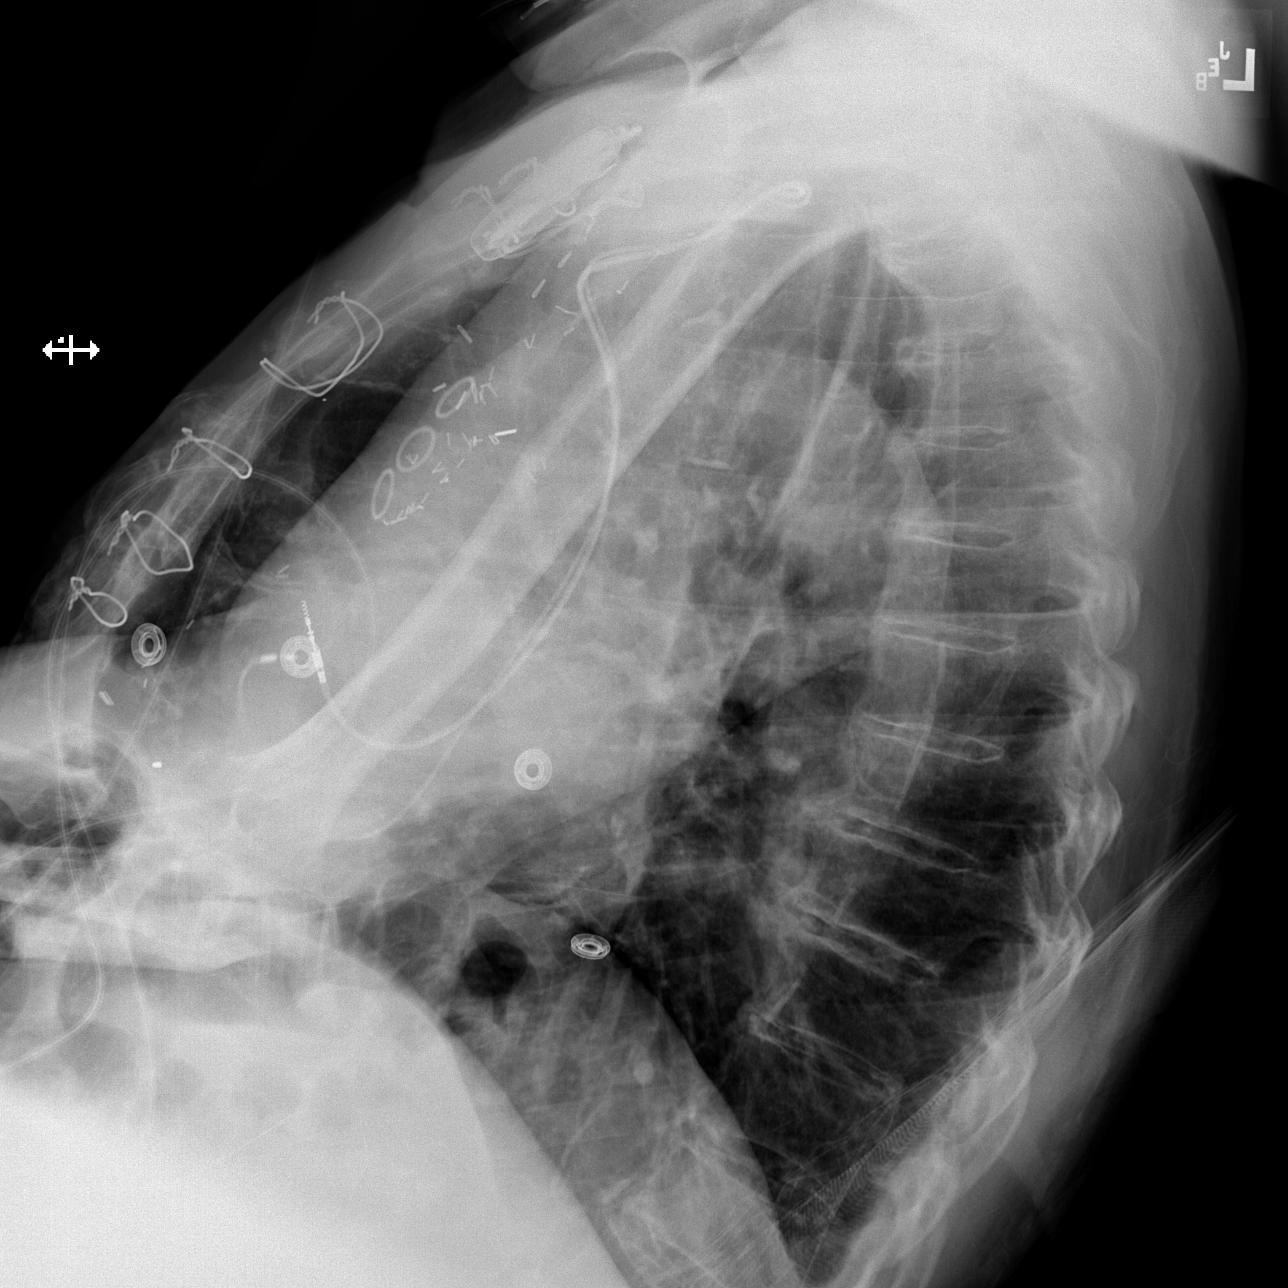

[w chest lat (2 of 2)]
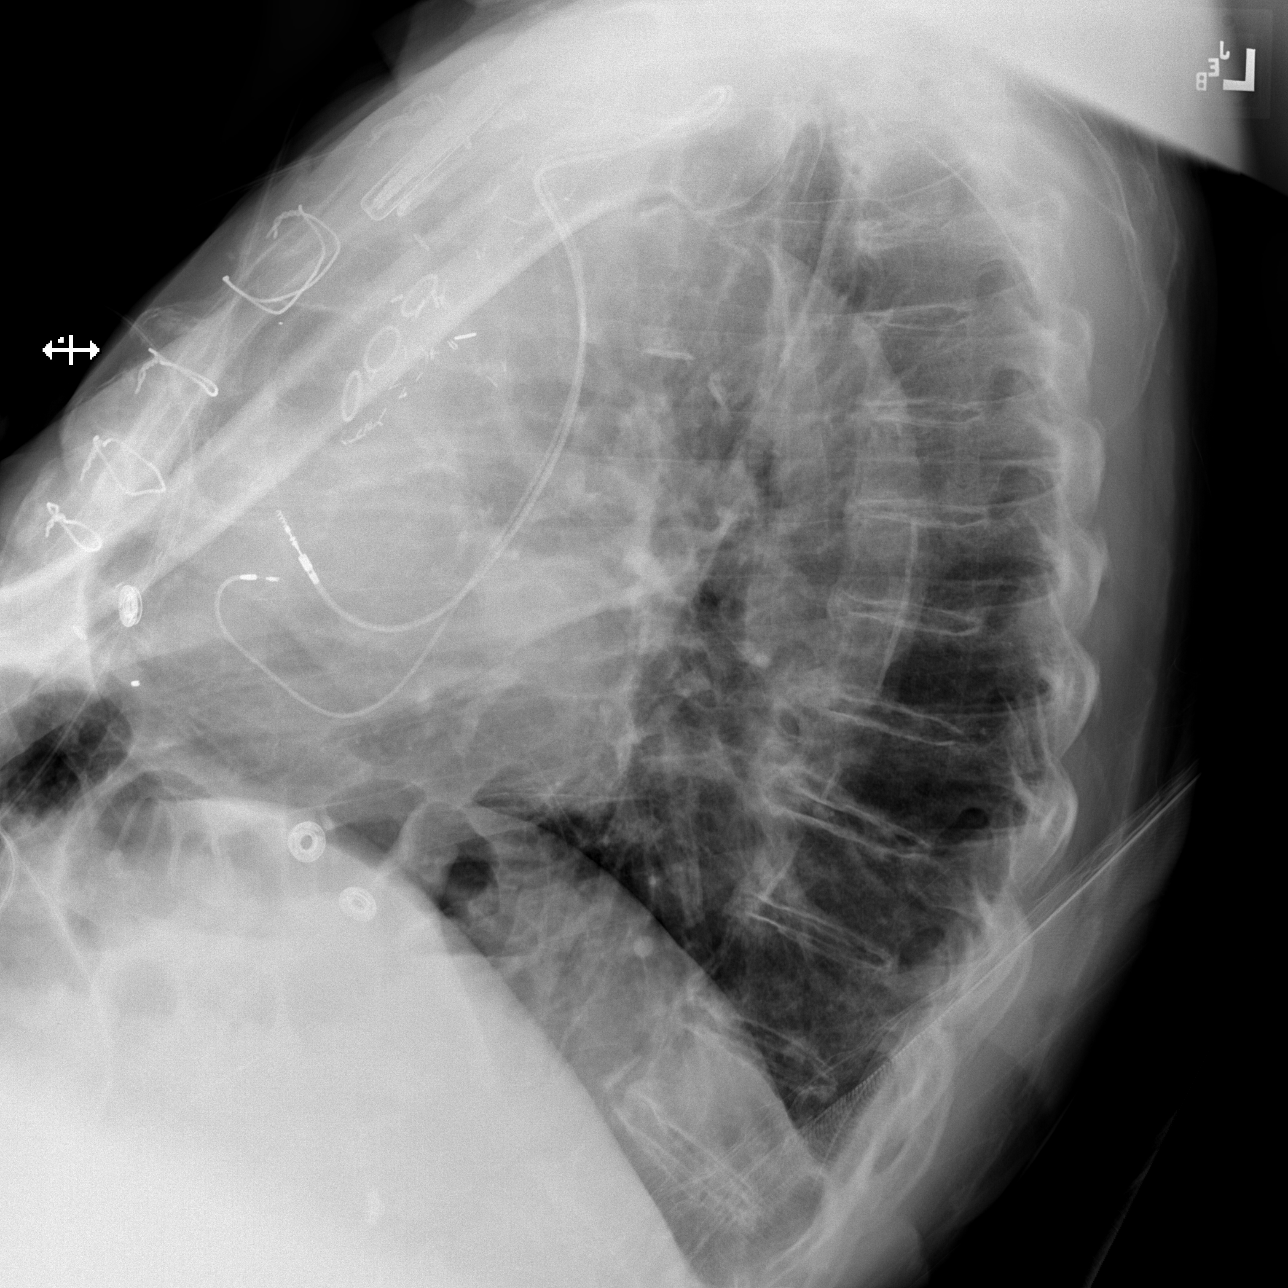

[AP]
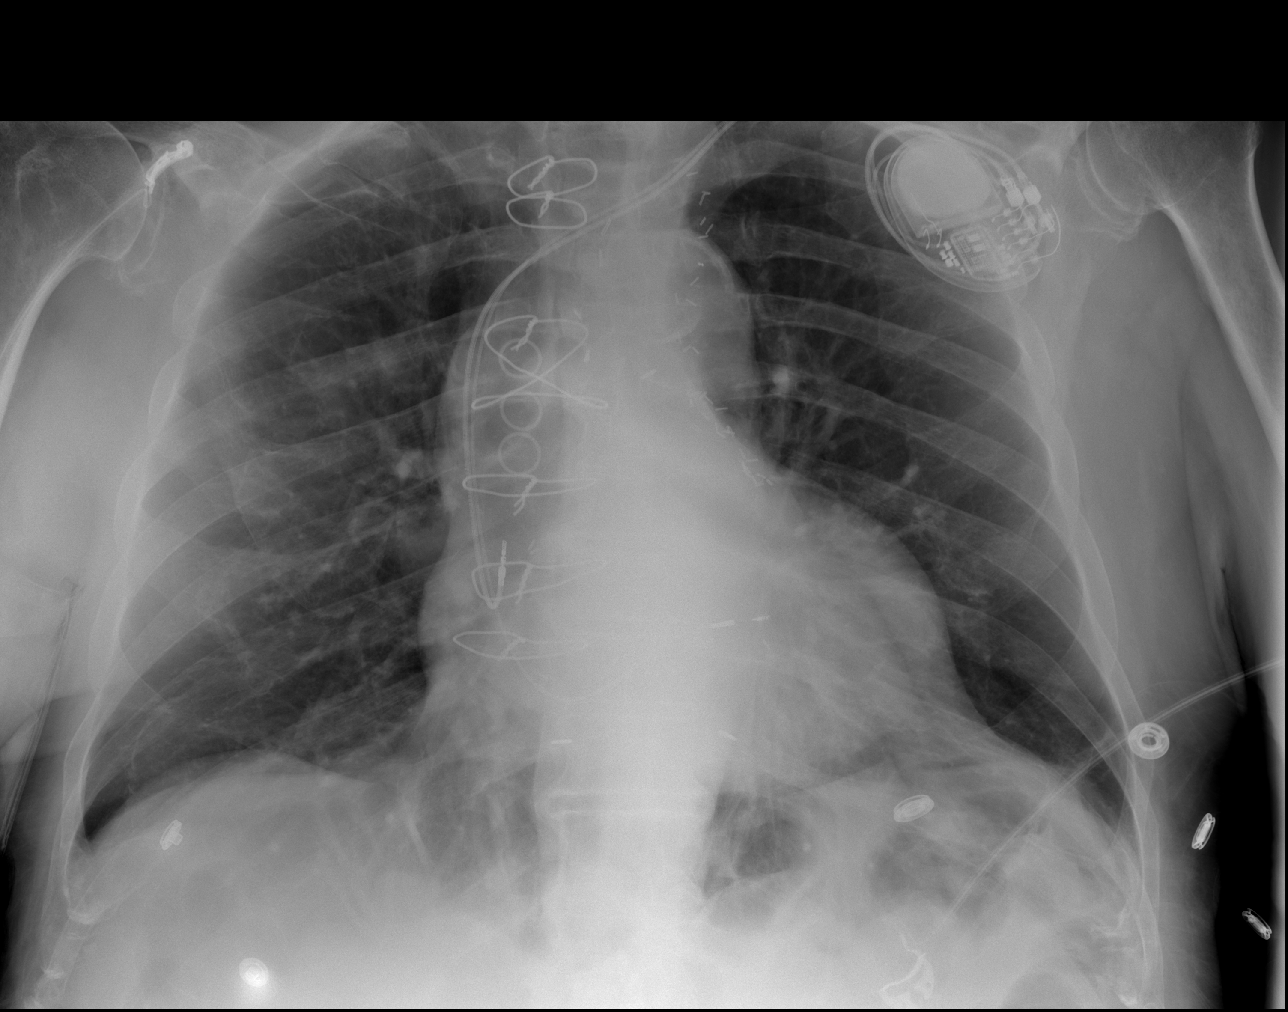

[3 of 3 positions shown; findings below may reference images not displayed]

FINDINGS: There is a new left-sided pacemaker in place. There is no evidence
for pneumothorax.

Patient is status post cardiac surgery. The aorta is tortuous with
atherosclerotic calcification. The heart is mildly enlarged and
unchanged. There is no focal lung infiltrate, pleural effusion or
pneumothorax. No acute fractures are seen.
IMPRESSION: 1. New left-sided pacemaker.  No pneumothorax.
2. Stable cardiomegaly.

## 2023-03-29 ENCOUNTER — Encounter: Payer: Self-pay | Admitting: Cardiology

## 2023-03-29 ENCOUNTER — Ambulatory Visit: Payer: PPO | Attending: Cardiology | Admitting: Cardiology

## 2023-03-29 VITALS — BP 116/64 | HR 60 | Ht 65.0 in | Wt 173.2 lb

## 2023-03-29 DIAGNOSIS — I480 Paroxysmal atrial fibrillation: Secondary | ICD-10-CM | POA: Diagnosis not present

## 2023-03-29 DIAGNOSIS — I25810 Atherosclerosis of coronary artery bypass graft(s) without angina pectoris: Secondary | ICD-10-CM | POA: Diagnosis not present

## 2023-03-29 DIAGNOSIS — I77819 Aortic ectasia, unspecified site: Secondary | ICD-10-CM | POA: Diagnosis not present

## 2023-03-29 NOTE — Progress Notes (Signed)
Cardiology Office Note:    Date:  03/29/2023   ID:  [name] [name], DOB Apr 26, 1941, MRN [id number]  PCP:  Renford Dills, MD   [facility] Providers Cardiologist:  Lesleigh Noe, MD (Inactive)     Referring MD: Renford Dills, MD    History of Present Illness:    [name] [name] is a 82 y.o. male former patient of Dr. Verdis Prime here for the follow-up of coronary artery disease, CABG in 1998 Medtronic pacemaker followed by Dr. Ladona Ridgel in 2022 secondary to complete heart block.  Isosorbide was started because of exertional related chest discomfort.  Angina has been quelled.  Atrial fibrillation on Eliquis.  Overall doing quite well.  Wife helps answer questions.  He does have memory impairment.  No chest pain  Past Medical History:  Diagnosis Date   Benign prostatic hypertrophy with lower urinary tract symptoms (LUTS)    Bilateral edema of lower extremity    IMPROVED W/ TED HOSE   CAD (coronary artery disease)    cardiologist-  dr Verdis Prime--  s/p  cabg x6 in 1998   Heart murmur    Hiatal hernia    History of kidney stones    Hydronephrosis, left    Hyperlipidemia    Hypertension    Hypothyroidism    Left ureteral stone    Memory difficulties    Nephrolithiasis    right side per ct  non-obstructive   PVD (peripheral vascular disease) (HCC)    RBBB (right bundle branch block)    S/P CABG x 6    1998   Sigmoid diverticulosis    Sinus bradycardia    Type 2 diabetes mellitus (HCC)     Past Surgical History:  Procedure Laterality Date   CATARACT EXTRACTION W/ INTRAOCULAR LENS  IMPLANT, BILATERAL  2011   CORONARY ARTERY BYPASS GRAFT  1998   CABG X 6 --   LIMA  to the LAD and  other undefined SVG's   CYSTOSCOPY WITH BIOPSY N/A 11/29/2015   Procedure: CYSTOSCOPY WITH BIOPSY AND FULGERATION;  Surgeon: Sebastian Ache, MD;  Location: [facility];  Service: Urology;  Laterality: N/A;   CYSTOSCOPY/RETROGRADE/URETEROSCOPY/STONE EXTRACTION WITH BASKET  Left 11/29/2015   Procedure: CYSTOSCOPY/RETROGRADE/DIAGNOSTIC URETEROSCOPY/STONE EXTRACTION;  Surgeon: Sebastian Ache, MD;  Location: [facility];  Service: Urology;  Laterality: Left;   EXTRACORPOREAL SHOCK WAVE LITHOTRIPSY Left 08-12-2015   INGUINAL HERNIA REPAIR Right 02/03/2022   Procedure: OPEN RIGHT INGUINAL HERNIA REPAIR WITH MESH;  Surgeon: Berna Bue, MD;  Location: [facility];  Service: General;  Laterality: Right;   LEFT HEART CATHETERIZATION WITH CORONARY/GRAFT ANGIOGRAM  06/16/2013   Procedure: LEFT HEART CATHETERIZATION WITH Isabel Caprice;  Surgeon: Lesleigh Noe, MD;  Location: [facility];  Service: Cardiovascular; Native vessel occlusive disease w/ ostial total occlusion RCA, dLM, & heavy calicification LM LAD CX RCA/  SVG to diagonal patent, SVG to marginal 1 patent & 2 occluded, LIMA to dLAD patent/ dCFX collateral from obtuse marginal graft/  normal LVF   PACEMAKER IMPLANT N/A 07/17/2021   Procedure: PACEMAKER IMPLANT;  Surgeon: Marinus Maw, MD;  Location: [facility];  Service: Cardiovascular;  Laterality: N/A;   TRANSTHORACIC ECHOCARDIOGRAM  05-23-2015  dr Verdis Prime   mild LVH,  grade 1 diastolic dysfunction,  ef 55-060%,  mild AV calcifacation without stenosis,  mild AR and MR,  moderate LAE,  trivial PR and TR,  mild increase pulmonary pressure  Current Medications: Current Meds  Medication Sig   acetaminophen (TYLENOL) 325 MG tablet Take 1-2 tablets (325-650 mg total) by mouth every 4 (four) hours as needed for mild pain.   amLODipine (NORVASC) 5 MG tablet SMARTSIG:1 Tablet(s) By Mouth Every Evening   apixaban (ELIQUIS) 5 MG TABS tablet Start taking 07/22/21 in the morning 1 tablet (5 mg total) by mouth 2 (two) times daily.   atorvastatin (LIPITOR) 80 MG tablet Take 40 mg by mouth every morning.    Cyanocobalamin 1000 MCG/ML KIT Inject 1,000 mcg as directed every 30 (thirty) days.   finasteride (PROSCAR) 5 MG tablet Take 5 mg by  mouth every morning.    Flaxseed, Linseed, (FLAX SEEDS PO) Take 1 capsule by mouth daily.   hydrochlorothiazide (HYDRODIURIL) 25 MG tablet Take 25 mg by mouth every morning.    isosorbide mononitrate (IMDUR) 30 MG 24 hr tablet Take 1 tablet (30 mg total) by mouth daily.   levothyroxine (SYNTHROID, LEVOTHROID) 100 MCG tablet Take 100 mcg by mouth daily before breakfast.   memantine (NAMENDA) 10 MG tablet Take 1 tablet by mouth twice daily   metFORMIN (GLUCOPHAGE) 500 MG tablet Take 500 mg by mouth daily with breakfast.   metoprolol succinate (TOPROL-XL) 25 MG 24 hr tablet TAKE 1 TABLET BY MOUTH ONCE DAILY.   nitroGLYCERIN (NITROSTAT) 0.4 MG SL tablet DISSOLVE ONE TABLET UNDER THE TONGUE EVERY 5 MINUTES AS NEEDED FOR CHEST PAIN.   potassium chloride SA (K-DUR,KLOR-CON) 20 MEQ tablet Take 10 mEq by mouth every morning.    tamsulosin (FLOMAX) 0.4 MG CAPS capsule Take 0.4 mg by mouth daily.     Allergies:   Spironolactone and Penicillins   Social History   Socioeconomic History   Marital status: Married    Spouse name: [name]   Number of children: 2   Years of education: 12   Highest education level: Not on file  Occupational History    Comment: retired  Tobacco Use   Smoking status: Former    Packs/day: 0.25    Years: 10.00    Additional pack years: 0.00    Total pack years: 2.50    Types: Cigarettes    Quit date: 10/26/1966    Years since quitting: 56.4    Passive exposure: Never   Smokeless tobacco: Never  Vaping Use   Vaping Use: Never used  Substance and Sexual Activity   Alcohol use: No    Alcohol/week: 0.0 standard drinks of alcohol   Drug use: No   Sexual activity: Not on file  Other Topics Concern   Not on file  Social History Narrative   06/09/21 lives with wife   Social Determinants of Health   Financial Resource Strain: Not on file  Food Insecurity: Not on file  Transportation Needs: Not on file  Physical Activity: Not on file  Stress: Not on file  Social  Connections: Not on file     Family History: The patient's family history includes CAD in his father; Heart disease in his sister; Tuberculosis in his mother.  ROS:   Please see the history of present illness.     All other systems reviewed and are negative.  EKGs/Labs/Other Studies Reviewed:    The following studies were reviewed today:  Echocardiogram 2022: IMPRESSIONS   1. Left ventricular ejection fraction, by estimation, is 50 to 55%. The  left ventricle has low normal function. The left ventricle has no regional  wall motion abnormalities. There is mild left ventricular hypertrophy.  Left  ventricular diastolic  parameters are indeterminate.   2. Right ventricular systolic function is normal. The right ventricular  size is normal. Tricuspid regurgitation signal is inadequate for assessing  PA pressure.   3. Left atrial size was moderately dilated.   4. Right atrial size was mildly dilated.   5. The mitral valve is grossly normal. Mild mitral valve regurgitation.  No evidence of mitral stenosis.   6. The aortic valve is grossly normal. There is moderate calcification of  the aortic valve. Aortic valve regurgitation is trivial. Mild aortic valve  sclerosis is present, with no evidence of aortic valve stenosis.   7. Aortic dilatation noted. There is mild dilatation of the aortic root,  measuring 41 mm.   EKG:  No new  Recent Labs: No results found for requested labs within last 365 days.  Recent Lipid Panel No results found for: "CHOL", "TRIG", "HDL", "CHOLHDL", "VLDL", "LDLCALC", "LDLDIRECT"   Risk Assessment/Calculations:               Physical Exam:    VS:  BP 116/64   Pulse 60   Ht 5\' 5"  (1.651 m)   Wt 173 lb 3.2 oz (78.6 kg)   SpO2 97%   BMI 28.82 kg/m     Wt Readings from Last 3 Encounters:  03/29/23 173 lb 3.2 oz (78.6 kg)  11/24/22 174 lb (78.9 kg)  10/13/22 183 lb 6.4 oz (83.2 kg)     GEN:  Well nourished, well developed in no acute  distress HEENT: Normal NECK: No JVD; No carotid bruits LYMPHATICS: No lymphadenopathy CARDIAC: RRR, no murmurs, rubs, gallops RESPIRATORY:  Clear to auscultation without rales, wheezing or rhonchi  ABDOMEN: Soft, non-tender, non-distended MUSCULOSKELETAL:  No edema; No deformity  SKIN: Warm and dry NEUROLOGIC:  Alert and oriented x 3 PSYCHIATRIC:  Normal affect   ASSESSMENT:    1. Coronary artery disease involving coronary bypass graft of native heart without angina pectoris   2. Paroxysmal atrial fibrillation (HCC)   3. Aortic dilatation (HCC)    PLAN:    In order of problems listed above:  Coronary artery disease status post CABG 1998 x 6 - Doing well, no anginal symptoms with Imdur.  Toprol.  Lab work reviewed.  Doing well.  Pacemaker - Sick sinus syndrome Dr. Ladona Ridgel functioning well  Paroxysmal atrial fibrillation - Overall doing quite well continue with anticoagulation.  Discussed with recent falls, needs to be careful.  If memory impairment becomes much worse, falls become much worse, need to consider anticoagulation barriers once again.  Memory impairment - Currently on medication management, Namenda.  Hyperlipidemia - On atorvastatin 40 mg.  PVD - No evidence of claudication.  Mildly dilated ascending aorta 41 mm on echocardiogram 2022 - We will continue to monitor.         Medication Adjustments/Labs and Tests Ordered: Current medicines are reviewed at length with the patient today.  Concerns regarding medicines are outlined above.  No orders of the defined types were placed in this encounter.  No orders of the defined types were placed in this encounter.   Patient Instructions  Medication Instructions:  The current medical regimen is effective;  continue present plan and medications.  *If you need a refill on your cardiac medications before your next appointment, please call your pharmacy*   Follow-Up: At [facility], you and your  health needs are our priority.  As part of our continuing mission to provide you with exceptional heart care, we have  created designated Provider Care Teams.  These Care Teams include your primary Cardiologist (physician) and Advanced Practice Providers (APPs -  Physician Assistants and Nurse Practitioners) who all work together to provide you with the care you need, when you need it.  We recommend signing up for the patient portal called "MyChart".  Sign up information is provided on this After Visit Summary.  MyChart is used to connect with patients for Virtual Visits (Telemedicine).  Patients are able to view lab/test results, encounter notes, upcoming appointments, etc.  Non-urgent messages can be sent to your provider as well.   To learn more about what you can do with MyChart, go to [email or web address].    Your next appointment:   6 month(s)  Provider:   Robin Searing, NP, Eligha Bridegroom, NP, or Tereso Newcomer, PA-C            Signed, Donato Schultz, MD  03/29/2023 12:18 PM    [facility]

## 2023-03-29 NOTE — Patient Instructions (Signed)
Medication Instructions:  The current medical regimen is effective;  continue present plan and medications.  *If you need a refill on your cardiac medications before your next appointment, please call your pharmacy*   Follow-Up: At [facility], you and your health needs are our priority.  As part of our continuing mission to provide you with exceptional heart care, we have created designated Provider Care Teams.  These Care Teams include your primary Cardiologist (physician) and Advanced Practice Providers (APPs -  Physician Assistants and Nurse Practitioners) who all work together to provide you with the care you need, when you need it.  We recommend signing up for the patient portal called "MyChart".  Sign up information is provided on this After Visit Summary.  MyChart is used to connect with patients for Virtual Visits (Telemedicine).  Patients are able to view lab/test results, encounter notes, upcoming appointments, etc.  Non-urgent messages can be sent to your provider as well.   To learn more about what you can do with MyChart, go to [email or web address].    Your next appointment:   6 month(s)  Provider:   Robin Searing, NP, Eligha Bridegroom, NP, or Tereso Newcomer, PA-C

## 2023-04-13 DIAGNOSIS — K4090 Unilateral inguinal hernia, without obstruction or gangrene, not specified as recurrent: Secondary | ICD-10-CM | POA: Diagnosis not present

## 2023-04-13 DIAGNOSIS — N3000 Acute cystitis without hematuria: Secondary | ICD-10-CM | POA: Diagnosis not present

## 2023-04-13 DIAGNOSIS — N200 Calculus of kidney: Secondary | ICD-10-CM | POA: Diagnosis not present

## 2023-04-13 DIAGNOSIS — R338 Other retention of urine: Secondary | ICD-10-CM | POA: Diagnosis not present

## 2023-04-19 DIAGNOSIS — L539 Erythematous condition, unspecified: Secondary | ICD-10-CM | POA: Diagnosis not present

## 2023-04-19 DIAGNOSIS — E538 Deficiency of other specified B group vitamins: Secondary | ICD-10-CM | POA: Diagnosis not present

## 2023-04-19 DIAGNOSIS — I10 Essential (primary) hypertension: Secondary | ICD-10-CM | POA: Diagnosis not present

## 2023-04-19 DIAGNOSIS — W57XXXA Bitten or stung by nonvenomous insect and other nonvenomous arthropods, initial encounter: Secondary | ICD-10-CM | POA: Diagnosis not present

## 2023-04-20 ENCOUNTER — Ambulatory Visit: Payer: PPO

## 2023-04-20 DIAGNOSIS — I4510 Unspecified right bundle-branch block: Secondary | ICD-10-CM | POA: Diagnosis not present

## 2023-04-20 LAB — CUP PACEART REMOTE DEVICE CHECK
Battery Remaining Longevity: 107 mo
Battery Voltage: 3.01 V
Brady Statistic AP VP Percent: 87.94 %
Brady Statistic AP VS Percent: 0.62 %
Brady Statistic AS VP Percent: 5.1 %
Brady Statistic AS VS Percent: 6.34 %
Brady Statistic RA Percent Paced: 92.86 %
Brady Statistic RV Percent Paced: 93.04 %
Date Time Interrogation Session: 20240625040751
Implantable Lead Connection Status: 753985
Implantable Lead Connection Status: 753985
Implantable Lead Implant Date: 20220922
Implantable Lead Implant Date: 20220922
Implantable Lead Location: 753859
Implantable Lead Location: 753860
Implantable Lead Model: 3830
Implantable Lead Model: 5076
Implantable Pulse Generator Implant Date: 20220922
Lead Channel Impedance Value: 304 Ohm
Lead Channel Impedance Value: 342 Ohm
Lead Channel Impedance Value: 456 Ohm
Lead Channel Impedance Value: 475 Ohm
Lead Channel Pacing Threshold Amplitude: 0.625 V
Lead Channel Pacing Threshold Amplitude: 1.25 V
Lead Channel Pacing Threshold Pulse Width: 0.4 ms
Lead Channel Pacing Threshold Pulse Width: 0.4 ms
Lead Channel Sensing Intrinsic Amplitude: 3.375 mV
Lead Channel Sensing Intrinsic Amplitude: 3.375 mV
Lead Channel Sensing Intrinsic Amplitude: 9.625 mV
Lead Channel Sensing Intrinsic Amplitude: 9.625 mV
Lead Channel Setting Pacing Amplitude: 1.5 V
Lead Channel Setting Pacing Amplitude: 2.75 V
Lead Channel Setting Pacing Pulse Width: 0.4 ms
Lead Channel Setting Sensing Sensitivity: 0.9 mV
Zone Setting Status: 755011
Zone Setting Status: 755011

## 2023-05-04 ENCOUNTER — Other Ambulatory Visit: Payer: Self-pay

## 2023-05-04 DIAGNOSIS — I2510 Atherosclerotic heart disease of native coronary artery without angina pectoris: Secondary | ICD-10-CM

## 2023-05-04 MED ORDER — NITROGLYCERIN 0.4 MG SL SUBL: SUBLINGUAL_TABLET | SUBLINGUAL | 11 refills | Status: DC

## 2023-05-12 NOTE — Progress Notes (Signed)
 Remote pacemaker transmission.   

## 2023-05-19 DIAGNOSIS — E538 Deficiency of other specified B group vitamins: Secondary | ICD-10-CM | POA: Diagnosis not present

## 2023-06-29 ENCOUNTER — Other Ambulatory Visit: Payer: Self-pay

## 2023-06-29 MED ORDER — ISOSORBIDE MONONITRATE ER 30 MG PO TB24: 30.0000 mg | ORAL_TABLET | Freq: Every day | ORAL | 2 refills | Status: DC

## 2023-07-20 ENCOUNTER — Ambulatory Visit (INDEPENDENT_AMBULATORY_CARE_PROVIDER_SITE_OTHER): Payer: PPO

## 2023-07-20 DIAGNOSIS — I480 Paroxysmal atrial fibrillation: Secondary | ICD-10-CM

## 2023-07-20 DIAGNOSIS — E119 Type 2 diabetes mellitus without complications: Secondary | ICD-10-CM | POA: Diagnosis not present

## 2023-07-20 DIAGNOSIS — Z961 Presence of intraocular lens: Secondary | ICD-10-CM | POA: Diagnosis not present

## 2023-07-20 DIAGNOSIS — H18413 Arcus senilis, bilateral: Secondary | ICD-10-CM | POA: Diagnosis not present

## 2023-07-20 DIAGNOSIS — H04123 Dry eye syndrome of bilateral lacrimal glands: Secondary | ICD-10-CM | POA: Diagnosis not present

## 2023-07-20 LAB — CUP PACEART REMOTE DEVICE CHECK
Battery Remaining Longevity: 104 mo
Battery Voltage: 3 V
Brady Statistic AP VP Percent: 89.87 %
Brady Statistic AP VS Percent: 0.48 %
Brady Statistic AS VP Percent: 7.01 %
Brady Statistic AS VS Percent: 2.64 %
Brady Statistic RA Percent Paced: 91.8 %
Brady Statistic RV Percent Paced: 96.88 %
Date Time Interrogation Session: 20240924004740
Implantable Lead Connection Status: 753985
Implantable Lead Connection Status: 753985
Implantable Lead Implant Date: 20220922
Implantable Lead Implant Date: 20220922
Implantable Lead Location: 753859
Implantable Lead Location: 753860
Implantable Lead Model: 3830
Implantable Lead Model: 5076
Implantable Pulse Generator Implant Date: 20220922
Lead Channel Impedance Value: 304 Ohm
Lead Channel Impedance Value: 342 Ohm
Lead Channel Impedance Value: 456 Ohm
Lead Channel Impedance Value: 475 Ohm
Lead Channel Pacing Threshold Amplitude: 0.625 V
Lead Channel Pacing Threshold Amplitude: 1.25 V
Lead Channel Pacing Threshold Pulse Width: 0.4 ms
Lead Channel Pacing Threshold Pulse Width: 0.4 ms
Lead Channel Sensing Intrinsic Amplitude: 12 mV
Lead Channel Sensing Intrinsic Amplitude: 12 mV
Lead Channel Sensing Intrinsic Amplitude: 3.125 mV
Lead Channel Sensing Intrinsic Amplitude: 3.125 mV
Lead Channel Setting Pacing Amplitude: 1.5 V
Lead Channel Setting Pacing Amplitude: 2.75 V
Lead Channel Setting Pacing Pulse Width: 0.4 ms
Lead Channel Setting Sensing Sensitivity: 0.9 mV
Zone Setting Status: 755011
Zone Setting Status: 755011

## 2023-07-22 DIAGNOSIS — E538 Deficiency of other specified B group vitamins: Secondary | ICD-10-CM | POA: Diagnosis not present

## 2023-08-05 NOTE — Progress Notes (Signed)
Remote pacemaker transmission.   

## 2023-08-10 ENCOUNTER — Telehealth: Payer: Self-pay | Admitting: *Deleted

## 2023-08-10 NOTE — Telephone Encounter (Signed)
Pre-operative Risk Assessment    Patient Name: MAVERIC PILTZ  DOB: 06/12/1941 MRN: [id number]  DATE OF LAST VISIT: 03/29/23 DR. SKAINS DATE OF NEXT VISIT: 10/19/23 DR. SKAINS   Request for Surgical Clearance    Procedure:  Dental Extraction - Amount of Teeth to be Pulled:  1 TOOTH FOR SIMPLE  EXTRACTION - PER DR. Azucena Cecil, DDS (WISDOM TOOTH)  Date of Surgery:  Clearance TBD                                 Surgeon:  DR. Richrd Humbles, DDS Surgeon's Group or Practice Name:  [facility] Phone number:  [phone] Fax number:  [phone]   Type of Clearance Requested:   - Medical  - Pharmacy:  Hold Apixaban (Eliquis) x 3-5 DAYS PRIOR   Type of Anesthesia:  Local    Additional requests/questions:    Elpidio Anis   08/10/2023, 12:24 PM

## 2023-08-10 NOTE — Telephone Encounter (Signed)
   Primary Cardiologist: Donato Schultz, MD  Chart reviewed as part of pre-operative protocol coverage. Simple dental extractions (1-2 teeth) are considered low risk procedures per guidelines and generally do not require any specific cardiac clearance. It is also generally accepted that for simple extractions and dental cleanings, there is no need to interrupt blood thinner therapy.   SBE prophylaxis is not required for the patient.  I will route this recommendation to the requesting party via Epic fax function and remove from pre-op pool.  Please call with questions.  Levi Aland, NP-C  08/10/2023, 12:48 PM [address], [address] Office [phone] Fax [phone]

## 2023-08-19 DIAGNOSIS — E538 Deficiency of other specified B group vitamins: Secondary | ICD-10-CM | POA: Diagnosis not present

## 2023-08-30 ENCOUNTER — Other Ambulatory Visit: Payer: Self-pay | Admitting: *Deleted

## 2023-08-30 DIAGNOSIS — I6523 Occlusion and stenosis of bilateral carotid arteries: Secondary | ICD-10-CM

## 2023-09-01 DIAGNOSIS — F0390 Unspecified dementia without behavioral disturbance: Secondary | ICD-10-CM | POA: Diagnosis not present

## 2023-09-01 DIAGNOSIS — E039 Hypothyroidism, unspecified: Secondary | ICD-10-CM | POA: Diagnosis not present

## 2023-09-01 DIAGNOSIS — I2510 Atherosclerotic heart disease of native coronary artery without angina pectoris: Secondary | ICD-10-CM | POA: Diagnosis not present

## 2023-09-01 DIAGNOSIS — E1151 Type 2 diabetes mellitus with diabetic peripheral angiopathy without gangrene: Secondary | ICD-10-CM | POA: Diagnosis not present

## 2023-09-01 DIAGNOSIS — E7800 Pure hypercholesterolemia, unspecified: Secondary | ICD-10-CM | POA: Diagnosis not present

## 2023-09-01 DIAGNOSIS — D6869 Other thrombophilia: Secondary | ICD-10-CM | POA: Diagnosis not present

## 2023-09-01 DIAGNOSIS — I10 Essential (primary) hypertension: Secondary | ICD-10-CM | POA: Diagnosis not present

## 2023-09-01 DIAGNOSIS — R413 Other amnesia: Secondary | ICD-10-CM | POA: Diagnosis not present

## 2023-09-01 DIAGNOSIS — E538 Deficiency of other specified B group vitamins: Secondary | ICD-10-CM | POA: Diagnosis not present

## 2023-09-01 DIAGNOSIS — I4891 Unspecified atrial fibrillation: Secondary | ICD-10-CM | POA: Diagnosis not present

## 2023-09-01 DIAGNOSIS — N400 Enlarged prostate without lower urinary tract symptoms: Secondary | ICD-10-CM | POA: Diagnosis not present

## 2023-09-01 DIAGNOSIS — Z950 Presence of cardiac pacemaker: Secondary | ICD-10-CM | POA: Diagnosis not present

## 2023-09-06 ENCOUNTER — Ambulatory Visit: Payer: PPO | Admitting: Physician Assistant

## 2023-09-06 ENCOUNTER — Ambulatory Visit (HOSPITAL_COMMUNITY)
Admission: RE | Admit: 2023-09-06 | Discharge: 2023-09-06 | Disposition: A | Discharge status: Home / Self Care | Payer: PPO | Source: Ambulatory Visit | Attending: Surgery | Admitting: Surgery

## 2023-09-06 VITALS — BP 110/65 | HR 60 | Temp 98.1°F | Ht 65.0 in | Wt 173.6 lb

## 2023-09-06 DIAGNOSIS — I6523 Occlusion and stenosis of bilateral carotid arteries: Secondary | ICD-10-CM | POA: Insufficient documentation

## 2023-09-06 NOTE — Progress Notes (Signed)
Office Note   History of Present Illness   [name] [name] is a 82 y.o. (Oct 21, 1941) male who presents for surveillance of carotid artery stenosis.  He has no prior history of carotid intervention.  He has no prior history of CVA or TIA.  The patient returns today for follow up.  He denies any medical history changes since his last visit.  He denies any strokelike symptoms such as amaurosis fugax, slurred speech, unilateral weakness/numbness, or facial droop.  His wife was present during today's visit.  She helped obtain an accurate history given the patient's dementia.  Current Outpatient Medications  Medication Sig Dispense Refill   acetaminophen (TYLENOL) 325 MG tablet Take 1-2 tablets (325-650 mg total) by mouth every 4 (four) hours as needed for mild pain. 30 tablet 0   amLODipine (NORVASC) 5 MG tablet SMARTSIG:1 Tablet(s) By Mouth Every Evening     apixaban (ELIQUIS) 5 MG TABS tablet Start taking 07/22/21 in the morning 1 tablet (5 mg total) by mouth 2 (two) times daily. 60 tablet 0   atorvastatin (LIPITOR) 80 MG tablet Take 40 mg by mouth every morning.      Cyanocobalamin 1000 MCG/ML KIT Inject 1,000 mcg as directed every 30 (thirty) days.     Flaxseed, Linseed, (FLAX SEEDS PO) Take 1 capsule by mouth daily.     hydrochlorothiazide (HYDRODIURIL) 25 MG tablet Take 25 mg by mouth every morning.      isosorbide mononitrate (IMDUR) 30 MG 24 hr tablet Take 1 tablet (30 mg total) by mouth daily. 90 tablet 2   levothyroxine (SYNTHROID, LEVOTHROID) 100 MCG tablet Take 100 mcg by mouth daily before breakfast.     memantine (NAMENDA) 10 MG tablet Take 1 tablet by mouth twice daily 180 tablet 0   metFORMIN (GLUCOPHAGE) 500 MG tablet Take 500 mg by mouth daily with breakfast.     metoprolol succinate (TOPROL-XL) 25 MG 24 hr tablet TAKE 1 TABLET BY MOUTH ONCE DAILY. 90 tablet 3   nitroGLYCERIN (NITROSTAT) 0.4 MG SL tablet DISSOLVE ONE TABLET UNDER THE TONGUE EVERY 5 MINUTES AS NEEDED FOR CHEST  PAIN. 25 tablet 11   potassium chloride SA (K-DUR,KLOR-CON) 20 MEQ tablet Take 10 mEq by mouth every morning.      tamsulosin (FLOMAX) 0.4 MG CAPS capsule Take 0.4 mg by mouth daily.     finasteride (PROSCAR) 5 MG tablet Take 5 mg by mouth every morning.      No current facility-administered medications for this visit.    REVIEW OF SYSTEMS (negative unless checked):   Cardiac:  []  Chest pain or chest pressure? []  Shortness of breath upon activity? []  Shortness of breath when lying flat? []  Irregular heart rhythm?  Vascular:  []  Pain in calf, thigh, or hip brought on by walking? []  Pain in feet at night that wakes you up from your sleep? []  Blood clot in your veins? []  Leg swelling?  Pulmonary:  []  Oxygen at home? []  Productive cough? []  Wheezing?  Neurologic:  []  Sudden weakness in arms or legs? []  Sudden numbness in arms or legs? []  Sudden onset of difficult speaking or slurred speech? []  Temporary loss of vision in one eye? []  Problems with dizziness?  Gastrointestinal:  []  Blood in stool? []  Vomited blood?  Genitourinary:  []  Burning when urinating? []  Blood in urine?  Psychiatric:  []  Major depression  Hematologic:  []  Bleeding problems? []  Problems with blood clotting?  Dermatologic:  []  Rashes or ulcers?  Constitutional:  []   Fever or chills?  Ear/Nose/Throat:  []  Change in hearing? []  Nose bleeds? []  Sore throat?  Musculoskeletal:  []  Back pain? []  Joint pain? []  Muscle pain?   Physical Examination   Vitals:   09/06/23 1221 09/06/23 1224  BP: 125/78 110/65  Pulse: 60   Temp: 98.1 F (36.7 C)   TempSrc: Temporal   SpO2: 94%   Weight: 173 lb 9.6 oz (78.7 kg)   Height: 5\' 5"  (1.651 m)    Body mass index is 28.89 kg/m.  General:  WDWN in NAD; vital signs documented above Gait: Not observed HENT: WNL, normocephalic Pulmonary: normal non-labored breathing , without rales, rhonchi,  wheezing Cardiac: Regular, without carotid  bruit Abdomen: soft, NT, no masses Skin: without rashes Vascular Exam/Pulses: palpable radial pulses bilaterally Extremities: without ischemic changes, without gangrene , without cellulitis; without open wounds;  Musculoskeletal: no muscle wasting or atrophy  Neurologic: A&O X 3;  No focal weakness or paresthesias are detected Psychiatric:  The pt has Normal affect.  Non-Invasive Vascular Imaging   Bilateral Carotid Duplex (09/06/2023):  R ICA stenosis:  1-39% R VA:  patent and antegrade L ICA stenosis:  40-59% L VA:  patent and antegrade  Medical Decision Making   [name] [name] is a 82 y.o. male who presents for surveillance of carotid artery stenosis  Based on the patient's vascular studies, his carotid artery stenosis is unchanged.  His right ICA stenosis is 1 to 39%.  His left ICA stenosis is 40 to 59% He denies any strokelike symptoms such as slurred speech, facial droop, sudden visual changes, or sudden weakness/numbness.  He has palpable and equal radial pulses bilaterally.  He has no carotid bruit on exam He can follow-up with our office in 1 year with repeat carotid duplex   [name] Dubonnet PA-C [facility] Office: [phone]  Clinic MD: Myra Gianotti

## 2023-09-08 DIAGNOSIS — H6123 Impacted cerumen, bilateral: Secondary | ICD-10-CM | POA: Diagnosis not present

## 2023-09-17 ENCOUNTER — Other Ambulatory Visit: Payer: Self-pay

## 2023-09-17 DIAGNOSIS — I6523 Occlusion and stenosis of bilateral carotid arteries: Secondary | ICD-10-CM

## 2023-09-22 DIAGNOSIS — E538 Deficiency of other specified B group vitamins: Secondary | ICD-10-CM | POA: Diagnosis not present

## 2023-10-19 ENCOUNTER — Ambulatory Visit (INDEPENDENT_AMBULATORY_CARE_PROVIDER_SITE_OTHER): Payer: PPO

## 2023-10-19 ENCOUNTER — Encounter: Payer: Self-pay | Admitting: Cardiology

## 2023-10-19 ENCOUNTER — Ambulatory Visit: Payer: PPO | Attending: Cardiology | Admitting: Cardiology

## 2023-10-19 VITALS — BP 138/64 | HR 60 | Ht 65.0 in | Wt 172.0 lb

## 2023-10-19 DIAGNOSIS — I77819 Aortic ectasia, unspecified site: Secondary | ICD-10-CM

## 2023-10-19 DIAGNOSIS — I25810 Atherosclerosis of coronary artery bypass graft(s) without angina pectoris: Secondary | ICD-10-CM | POA: Diagnosis not present

## 2023-10-19 DIAGNOSIS — Z950 Presence of cardiac pacemaker: Secondary | ICD-10-CM

## 2023-10-19 DIAGNOSIS — I480 Paroxysmal atrial fibrillation: Secondary | ICD-10-CM | POA: Diagnosis not present

## 2023-10-19 DIAGNOSIS — I4510 Unspecified right bundle-branch block: Secondary | ICD-10-CM

## 2023-10-19 DIAGNOSIS — Z8679 Personal history of other diseases of the circulatory system: Secondary | ICD-10-CM

## 2023-10-19 NOTE — Progress Notes (Signed)
Cardiology Office Note:  .   Date:  10/19/2023  ID:  MALECHI GALLENTINE, DOB [birth year], MRN [id number] PCP: Renford Dills, MD  [facility] Providers Cardiologist:  Donato Schultz, MD     History of Present Illness: .   [name] [name] is a 82 y.o. male Discussed with the use of AI scribe software  History of Present Illness   The patient is an 82 year old with a history of coronary artery bypass surgery in 1998, complete heart block managed with a Medtronic pacemaker since 2022, exertional angina managed with isosorbide, and atrial fibrillation managed with Eliquis. An echocardiogram in 2022 showed an ejection fraction of 50-55% with moderate left atrial dilation and an aortic root diameter of 41mm.  The patient has been experiencing anginal symptoms, particularly in cold weather, which are managed with nitroglycerin as needed. He is also on hydrochlorothiazide 25mg , amlodipine 5mg , and Toprol 25mg  for blood pressure management, and thyroid medication.  The patient has not reported any recent falls or chest pain. The patient's condition has been stable, and he has been regularly followed up by his cardiologist and other specialists.          Studies Reviewed: Marland Kitchen   EKG Interpretation Date/Time:  Tuesday October 19 2023 08:46:06 EST Ventricular Rate:  60 PR Interval:  178 QRS Duration:  186 QT Interval:  468 QTC Calculation: 468 R Axis:   87  Text Interpretation: AV dual-paced rhythm When compared with ECG of 17-Jul-2021 12:43, Premature ventricular complexes are no longer Present Vent. rate has decreased BY  12 BPM Confirmed by Donato Schultz ([id number]) on 10/19/2023 9:47:54 AM    Results   DIAGNOSTIC Echocardiogram: Ejection fraction 50-55%, moderately dilated left atrium, aortic root 41 mm (2022)     Risk Assessment/Calculations:            Physical Exam:   VS:  BP 138/64   Pulse 60   Ht 5\' 5"  (1.651 m)   Wt 172 lb (78 kg)   SpO2 98%   BMI 28.62 kg/m    Wt Readings  from Last 3 Encounters:  10/19/23 172 lb (78 kg)  09/06/23 173 lb 9.6 oz (78.7 kg)  03/29/23 173 lb 3.2 oz (78.6 kg)    GEN: Well nourished, well developed in no acute distress NECK: No JVD; No carotid bruits CARDIAC: RRR, no murmurs, no rubs, no gallops RESPIRATORY:  Clear to auscultation without rales, wheezing or rhonchi  ABDOMEN: Soft, non-tender, non-distended EXTREMITIES:  No edema; No deformity   ASSESSMENT AND PLAN: .    Assessment and Plan    Exertional Angina Exertional angina managed with isosorbide 30 mg daily. Symptoms are well-controlled with medication, but cold weather can exacerbate discomfort. Advised to use sublingual nitroglycerin as needed for chest discomfort. - Continue isosorbide 30 mg daily - Use sublingual nitroglycerin as needed for chest discomfort  Complete Heart Block with Pacemaker Complete heart block managed with a Medtronic pacemaker since 2022. No recent issues reported. Follow-up with Dr. Ladona Ridgel for pacemaker management. - Continue follow-up with Dr. Ladona Ridgel for pacemaker management - Schedule a 12 mth follow-up visit with Julian Hy, [place]  Atrial Fibrillation Atrial fibrillation managed with Eliquis for anticoagulation. No recent episodes reported. Discussed high cost of Eliquis and ongoing efforts to reduce medication costs. Explained that current legislation aims to reduce costs, but changes have not yet been seen. - Continue Eliquis for anticoagulation - Monitor for any changes in symptoms or adverse effects  Hypertension Hypertension managed with hydrochlorothiazide 25  mg, amlodipine 5 mg, and Toprol 25 mg. Blood pressure is well-controlled. - Continue hydrochlorothiazide 25 mg daily - Continue amlodipine 5 mg daily - Continue Toprol 25 mg daily  General Health Maintenance No recent falls reported. Advised to avoid activities that may pose a risk of injury. - Avoid high-risk activities such as using chainsaws  Follow-up - Schedule a 12 month  follow-up visit with Julian Hy, [place] - Contact the office if any issues or symptoms worsen.               Signed, Donato Schultz, MD

## 2023-10-19 NOTE — Patient Instructions (Signed)
 Medication Instructions:  The current medical regimen is effective;  continue present plan and medications.  *If you need a refill on your cardiac medications before your next appointment, please call your pharmacy*   Follow-Up: At [facility], you and your health needs are our priority.  As part of our continuing mission to provide you with exceptional heart care, we have created designated Provider Care Teams.  These Care Teams include your primary Cardiologist (physician) and Advanced Practice Providers (APPs -  Physician Assistants and Nurse Practitioners) who all work together to provide you with the care you need, when you need it.  We recommend signing up for the patient portal called "MyChart".  Sign up information is provided on this After Visit Summary.  MyChart is used to connect with patients for Virtual Visits (Telemedicine).  Patients are able to view lab/test results, encounter notes, upcoming appointments, etc.  Non-urgent messages can be sent to your provider as well.   To learn more about what you can do with MyChart, go to [email or web address].    Your next appointment:   1 year(s)  Provider:   Jari Favre, PA-C

## 2023-10-20 LAB — CUP PACEART REMOTE DEVICE CHECK
Battery Remaining Longevity: 100 mo
Battery Voltage: 3 V
Brady Statistic AP VP Percent: 87.76 %
Brady Statistic AP VS Percent: 0.12 %
Brady Statistic AS VP Percent: 11.11 %
Brady Statistic AS VS Percent: 1.01 %
Brady Statistic RA Percent Paced: 88.5 %
Brady Statistic RV Percent Paced: 98.87 %
Date Time Interrogation Session: 20241223194941
Implantable Lead Connection Status: 753985
Implantable Lead Connection Status: 753985
Implantable Lead Implant Date: 20220922
Implantable Lead Implant Date: 20220922
Implantable Lead Location: 753859
Implantable Lead Location: 753860
Implantable Lead Model: 3830
Implantable Lead Model: 5076
Implantable Pulse Generator Implant Date: 20220922
Lead Channel Impedance Value: 323 Ohm
Lead Channel Impedance Value: 361 Ohm
Lead Channel Impedance Value: 456 Ohm
Lead Channel Impedance Value: 456 Ohm
Lead Channel Pacing Threshold Amplitude: 0.625 V
Lead Channel Pacing Threshold Amplitude: 1.5 V
Lead Channel Pacing Threshold Pulse Width: 0.4 ms
Lead Channel Pacing Threshold Pulse Width: 0.4 ms
Lead Channel Sensing Intrinsic Amplitude: 12.125 mV
Lead Channel Sensing Intrinsic Amplitude: 12.125 mV
Lead Channel Sensing Intrinsic Amplitude: 3 mV
Lead Channel Sensing Intrinsic Amplitude: 3 mV
Lead Channel Setting Pacing Amplitude: 1.5 V
Lead Channel Setting Pacing Amplitude: 2.75 V
Lead Channel Setting Pacing Pulse Width: 0.4 ms
Lead Channel Setting Sensing Sensitivity: 0.9 mV
Zone Setting Status: 755011
Zone Setting Status: 755011

## 2023-10-25 DIAGNOSIS — E538 Deficiency of other specified B group vitamins: Secondary | ICD-10-CM | POA: Diagnosis not present

## 2023-11-28 NOTE — Progress Notes (Signed)
 Cardiology Office Note:  .   Date:  11/28/2023  ID:  BARI HANDSHOE, DOB [birth year], MRN [id number] PCP: Rexanne Ingle, MD  [facility] Providers Cardiologist:  Oneil Parchment, MD {  History of Present Illness: .   [name] [name] is a 83 y.o. male w/PMHx of CAD (CABG 1998), HTN, HLD, AFib, CHB w/PPM  He aw dr. Waddell 11/24/22, doing well, fairly sedentary, low AFib burden. No changes made  He has seen Dr. Parchment a couple times since then, last 10/19/23, + c/o CP, more so in the cold weather, well managed with PRN NTG. No changes were made.   Today's visit is scheduled as an annual device visit  ROS:   He comes accompanied by his wife. He is doing well No change since he saw Dr. Parchment, no escalation in the behavior of his CP reports, his wife mentions the cold weather tends to make him feel worse. No palpitations, SOB, no DOE No dizzy spells, near syncope or syncope No bleeding or signs of bleeding  Device information MDT dual chamber PPM implanted 07/17/21  Arrhythmia/AAD hx No AAD to date that I find  Studies Reviewed: SABRA    EKG not done today  DEVICE interrogation done today and reviewed by myself Battery and lead measurements are good RV threshold stable 1.5/0.4 and 1.0/0.8 > outputs adjusted to 2.5/0.8 No AFib Rare NSVT, longest 3 seconds  Unable to measure R waves, note slow VS not enough to measure Essentially dependent   07/16/21: TTE 1. Left ventricular ejection fraction, by estimation, is 50 to 55%. The  left ventricle has low normal function. The left ventricle has no regional  wall motion abnormalities. There is mild left ventricular hypertrophy.  Left ventricular diastolic  parameters are indeterminate.   2. Right ventricular systolic function is normal. The right ventricular  size is normal. Tricuspid regurgitation signal is inadequate for assessing  PA pressure.   3. Left atrial size was moderately dilated.   4. Right atrial size was mildly  dilated.   5. The mitral valve is grossly normal. Mild mitral valve regurgitation.  No evidence of mitral stenosis.   6. The aortic valve is grossly normal. There is moderate calcification of  the aortic valve. Aortic valve regurgitation is trivial. Mild aortic valve  sclerosis is present, with no evidence of aortic valve stenosis.   7. Aortic dilatation noted. There is mild dilatation of the aortic root,  measuring 41 mm.     Risk Assessment/Calculations:    Physical Exam:   VS:  There were no vitals taken for this visit.   Wt Readings from Last 3 Encounters:  10/19/23 172 lb (78 kg)  09/06/23 173 lb 9.6 oz (78.7 kg)  03/29/23 173 lb 3.2 oz (78.6 kg)    GEN: Well nourished, well developed in no acute distress NECK: No JVD; No carotid bruits CARDIAC: RRR, no murmurs, rubs, gallops RESPIRATORY:  CTA b/l without rales, wheezing or rhonchi  ABDOMEN: Soft, non-tender, non-distended EXTREMITIES:  No edema; No deformity   PPM site: is stable, no thinning, fluctuation, tethering  ASSESSMENT AND PLAN: .    paroxysmal AFib CHA2DS2Vasc is 4, on Eliquis , appropriately dosed by last available labs zero % burden   Update labs today  PPM intact function Programmed as above  CAD Stable angina C/w Dr. Beverlee  HTN Looks good  Secondary hypercoagulable state 2/2 AFib     Dispo: remotes as usual, in clinic with EP again in a year, sooner  if needed  Signed, Charlies Macario Arthur, PA-C

## 2023-11-30 ENCOUNTER — Ambulatory Visit: Payer: PPO | Attending: Physician Assistant | Admitting: Physician Assistant

## 2023-11-30 VITALS — BP 120/78 | HR 60 | Resp 16 | Ht 65.0 in | Wt 173.0 lb

## 2023-11-30 DIAGNOSIS — I25118 Atherosclerotic heart disease of native coronary artery with other forms of angina pectoris: Secondary | ICD-10-CM

## 2023-11-30 DIAGNOSIS — I10 Essential (primary) hypertension: Secondary | ICD-10-CM

## 2023-11-30 DIAGNOSIS — Z950 Presence of cardiac pacemaker: Secondary | ICD-10-CM | POA: Diagnosis not present

## 2023-11-30 DIAGNOSIS — D6869 Other thrombophilia: Secondary | ICD-10-CM

## 2023-11-30 DIAGNOSIS — I480 Paroxysmal atrial fibrillation: Secondary | ICD-10-CM

## 2023-11-30 NOTE — Patient Instructions (Signed)
Medication Instructions:   Your physician recommends that you continue on your current medications as directed. Please refer to the Current Medication list given to you today.   *If you need a refill on your cardiac medications before your next appointment, please call your pharmacy*   Lab Work:  PLEASE GO [facility]   [address] :  BMET AND CBC TODAY     If you have labs (blood work) drawn today and your tests are completely normal, you will receive your results only by: MyChart Message (if you have MyChart) OR A paper copy in the mail If you have any lab test that is abnormal or we need to change your treatment, we will call you to review the results.   Testing/Procedures: NONE ORDERED  TODAY    Follow-Up: At [facility], you and your health needs are our priority.  As part of our continuing mission to provide you with exceptional heart care, we have created designated Provider Care Teams.  These Care Teams include your primary Cardiologist (physician) and Advanced Practice Providers (APPs -  Physician Assistants and Nurse Practitioners) who all work together to provide you with the care you need, when you need it.  We recommend signing up for the patient portal called "MyChart".  Sign up information is provided on this After Visit Summary.  MyChart is used to connect with patients for Virtual Visits (Telemedicine).  Patients are able to view lab/test results, encounter notes, upcoming appointments, etc.  Non-urgent messages can be sent to your provider as well.   To learn more about what you can do with MyChart, go to [email or web address].     Your next appointment:    1 year(s)    Provider:    Francis Dowse, PA-C    Other Instructions

## 2023-11-30 NOTE — Addendum Note (Signed)
Addended by: Geralyn Flash D on: 11/30/2023 10:22 AM   Modules accepted: Orders

## 2023-11-30 NOTE — Progress Notes (Signed)
 Remote pacemaker transmission.

## 2023-12-01 ENCOUNTER — Other Ambulatory Visit: Payer: Self-pay | Admitting: Nurse Practitioner

## 2023-12-01 LAB — CBC
Hematocrit: 42.2 % (ref 37.5–51.0)
Hemoglobin: 13.8 g/dL (ref 13.0–17.7)
MCH: 29.1 pg (ref 26.6–33.0)
MCHC: 32.7 g/dL (ref 31.5–35.7)
MCV: 89 fL (ref 79–97)
Platelets: 226 10*3/uL (ref 150–450)
RBC: 4.74 x10E6/uL (ref 4.14–5.80)
RDW: 13.9 % (ref 11.6–15.4)
WBC: 6.9 10*3/uL (ref 3.4–10.8)

## 2023-12-01 LAB — BASIC METABOLIC PANEL
BUN/Creatinine Ratio: 12 (ref 10–24)
BUN: 14 mg/dL (ref 8–27)
CO2: 24 mmol/L (ref 20–29)
Calcium: 10.2 mg/dL (ref 8.6–10.2)
Chloride: 99 mmol/L (ref 96–106)
Creatinine, Ser: 1.13 mg/dL (ref 0.76–1.27)
Glucose: 99 mg/dL (ref 70–99)
Potassium: 4 mmol/L (ref 3.5–5.2)
Sodium: 138 mmol/L (ref 134–144)
eGFR: 64 mL/min/{1.73_m2} (ref >59)

## 2023-12-27 DIAGNOSIS — R21 Rash and other nonspecific skin eruption: Secondary | ICD-10-CM | POA: Diagnosis not present

## 2023-12-27 DIAGNOSIS — H6120 Impacted cerumen, unspecified ear: Secondary | ICD-10-CM | POA: Diagnosis not present

## 2023-12-27 DIAGNOSIS — E538 Deficiency of other specified B group vitamins: Secondary | ICD-10-CM | POA: Diagnosis not present

## 2024-01-17 DIAGNOSIS — H6122 Impacted cerumen, left ear: Secondary | ICD-10-CM | POA: Diagnosis not present

## 2024-01-18 ENCOUNTER — Ambulatory Visit (INDEPENDENT_AMBULATORY_CARE_PROVIDER_SITE_OTHER): Payer: PPO

## 2024-01-18 DIAGNOSIS — I480 Paroxysmal atrial fibrillation: Secondary | ICD-10-CM | POA: Diagnosis not present

## 2024-01-18 LAB — CUP PACEART REMOTE DEVICE CHECK
Battery Remaining Longevity: 84 mo
Battery Voltage: 2.99 V
Brady Statistic AP VP Percent: 89.19 %
Brady Statistic AP VS Percent: 0.01 %
Brady Statistic AS VP Percent: 10.24 %
Brady Statistic AS VS Percent: 0.57 %
Brady Statistic RA Percent Paced: 89.5 %
Brady Statistic RV Percent Paced: 99.43 %
Date Time Interrogation Session: 20250325051800
Implantable Lead Connection Status: 753985
Implantable Lead Connection Status: 753985
Implantable Lead Implant Date: 20220922
Implantable Lead Implant Date: 20220922
Implantable Lead Location: 753859
Implantable Lead Location: 753860
Implantable Lead Model: 3830
Implantable Lead Model: 5076
Implantable Pulse Generator Implant Date: 20220922
Lead Channel Impedance Value: 304 Ohm
Lead Channel Impedance Value: 342 Ohm
Lead Channel Impedance Value: 437 Ohm
Lead Channel Impedance Value: 456 Ohm
Lead Channel Pacing Threshold Amplitude: 0.625 V
Lead Channel Pacing Threshold Amplitude: 1.375 V
Lead Channel Pacing Threshold Pulse Width: 0.4 ms
Lead Channel Pacing Threshold Pulse Width: 0.4 ms
Lead Channel Sensing Intrinsic Amplitude: 3.375 mV
Lead Channel Sensing Intrinsic Amplitude: 3.375 mV
Lead Channel Sensing Intrinsic Amplitude: 8 mV
Lead Channel Sensing Intrinsic Amplitude: 8 mV
Lead Channel Setting Pacing Amplitude: 1.5 V
Lead Channel Setting Pacing Amplitude: 2.5 V
Lead Channel Setting Pacing Pulse Width: 0.8 ms
Lead Channel Setting Sensing Sensitivity: 0.9 mV
Zone Setting Status: 755011
Zone Setting Status: 755011

## 2024-01-27 DIAGNOSIS — E538 Deficiency of other specified B group vitamins: Secondary | ICD-10-CM | POA: Diagnosis not present

## 2024-02-28 ENCOUNTER — Other Ambulatory Visit: Payer: Self-pay | Admitting: Cardiology

## 2024-02-28 DIAGNOSIS — E538 Deficiency of other specified B group vitamins: Secondary | ICD-10-CM | POA: Diagnosis not present

## 2024-03-03 NOTE — Progress Notes (Signed)
 Remote pacemaker transmission.

## 2024-03-13 DIAGNOSIS — E039 Hypothyroidism, unspecified: Secondary | ICD-10-CM | POA: Diagnosis not present

## 2024-03-13 DIAGNOSIS — D6869 Other thrombophilia: Secondary | ICD-10-CM | POA: Diagnosis not present

## 2024-03-13 DIAGNOSIS — N400 Enlarged prostate without lower urinary tract symptoms: Secondary | ICD-10-CM | POA: Diagnosis not present

## 2024-03-13 DIAGNOSIS — E538 Deficiency of other specified B group vitamins: Secondary | ICD-10-CM | POA: Diagnosis not present

## 2024-03-13 DIAGNOSIS — Z950 Presence of cardiac pacemaker: Secondary | ICD-10-CM | POA: Diagnosis not present

## 2024-03-13 DIAGNOSIS — R413 Other amnesia: Secondary | ICD-10-CM | POA: Diagnosis not present

## 2024-03-13 DIAGNOSIS — I739 Peripheral vascular disease, unspecified: Secondary | ICD-10-CM | POA: Diagnosis not present

## 2024-03-13 DIAGNOSIS — E1151 Type 2 diabetes mellitus with diabetic peripheral angiopathy without gangrene: Secondary | ICD-10-CM | POA: Diagnosis not present

## 2024-03-13 DIAGNOSIS — I2510 Atherosclerotic heart disease of native coronary artery without angina pectoris: Secondary | ICD-10-CM | POA: Diagnosis not present

## 2024-03-13 DIAGNOSIS — E7800 Pure hypercholesterolemia, unspecified: Secondary | ICD-10-CM | POA: Diagnosis not present

## 2024-03-13 DIAGNOSIS — I10 Essential (primary) hypertension: Secondary | ICD-10-CM | POA: Diagnosis not present

## 2024-03-13 DIAGNOSIS — I4891 Unspecified atrial fibrillation: Secondary | ICD-10-CM | POA: Diagnosis not present

## 2024-03-31 DIAGNOSIS — E538 Deficiency of other specified B group vitamins: Secondary | ICD-10-CM | POA: Diagnosis not present

## 2024-04-18 ENCOUNTER — Ambulatory Visit (INDEPENDENT_AMBULATORY_CARE_PROVIDER_SITE_OTHER): Payer: PPO

## 2024-04-18 DIAGNOSIS — N3000 Acute cystitis without hematuria: Secondary | ICD-10-CM | POA: Diagnosis not present

## 2024-04-18 DIAGNOSIS — I480 Paroxysmal atrial fibrillation: Secondary | ICD-10-CM | POA: Diagnosis not present

## 2024-04-18 DIAGNOSIS — R338 Other retention of urine: Secondary | ICD-10-CM | POA: Diagnosis not present

## 2024-04-18 DIAGNOSIS — K4090 Unilateral inguinal hernia, without obstruction or gangrene, not specified as recurrent: Secondary | ICD-10-CM | POA: Diagnosis not present

## 2024-04-18 DIAGNOSIS — N200 Calculus of kidney: Secondary | ICD-10-CM | POA: Diagnosis not present

## 2024-04-18 LAB — CUP PACEART REMOTE DEVICE CHECK
Battery Remaining Longevity: 80 mo
Battery Voltage: 2.99 V
Brady Statistic AP VP Percent: 93.09 %
Brady Statistic AP VS Percent: 0.02 %
Brady Statistic AS VP Percent: 5.85 %
Brady Statistic AS VS Percent: 1.04 %
Brady Statistic RA Percent Paced: 93.63 %
Brady Statistic RV Percent Paced: 98.94 %
Date Time Interrogation Session: 20250624014014
Implantable Lead Connection Status: 753985
Implantable Lead Connection Status: 753985
Implantable Lead Implant Date: 20220922
Implantable Lead Implant Date: 20220922
Implantable Lead Location: 753859
Implantable Lead Location: 753860
Implantable Lead Model: 3830
Implantable Lead Model: 5076
Implantable Pulse Generator Implant Date: 20220922
Lead Channel Impedance Value: 304 Ohm
Lead Channel Impedance Value: 361 Ohm
Lead Channel Impedance Value: 418 Ohm
Lead Channel Impedance Value: 456 Ohm
Lead Channel Pacing Threshold Amplitude: 0.625 V
Lead Channel Pacing Threshold Amplitude: 1.625 V
Lead Channel Pacing Threshold Pulse Width: 0.4 ms
Lead Channel Pacing Threshold Pulse Width: 0.4 ms
Lead Channel Sensing Intrinsic Amplitude: 3.625 mV
Lead Channel Sensing Intrinsic Amplitude: 3.625 mV
Lead Channel Sensing Intrinsic Amplitude: 8 mV
Lead Channel Sensing Intrinsic Amplitude: 8 mV
Lead Channel Setting Pacing Amplitude: 1.5 V
Lead Channel Setting Pacing Amplitude: 2.5 V
Lead Channel Setting Pacing Pulse Width: 0.8 ms
Lead Channel Setting Sensing Sensitivity: 0.9 mV
Zone Setting Status: 755011
Zone Setting Status: 755011

## 2024-04-23 ENCOUNTER — Ambulatory Visit: Payer: Self-pay | Admitting: Internal Medicine

## 2024-05-03 DIAGNOSIS — E039 Hypothyroidism, unspecified: Secondary | ICD-10-CM | POA: Diagnosis not present

## 2024-05-04 DIAGNOSIS — E538 Deficiency of other specified B group vitamins: Secondary | ICD-10-CM | POA: Diagnosis not present

## 2024-06-06 DIAGNOSIS — E538 Deficiency of other specified B group vitamins: Secondary | ICD-10-CM | POA: Diagnosis not present

## 2024-07-10 DIAGNOSIS — E538 Deficiency of other specified B group vitamins: Secondary | ICD-10-CM | POA: Diagnosis not present

## 2024-07-18 ENCOUNTER — Ambulatory Visit (INDEPENDENT_AMBULATORY_CARE_PROVIDER_SITE_OTHER): Payer: PPO

## 2024-07-18 DIAGNOSIS — I480 Paroxysmal atrial fibrillation: Secondary | ICD-10-CM

## 2024-07-19 LAB — CUP PACEART REMOTE DEVICE CHECK
Battery Remaining Longevity: 77 mo
Battery Voltage: 2.98 V
Brady Statistic AP VP Percent: 92.07 %
Brady Statistic AP VS Percent: 0.01 %
Brady Statistic AS VP Percent: 6.54 %
Brady Statistic AS VS Percent: 1.38 %
Brady Statistic RA Percent Paced: 92.62 %
Brady Statistic RV Percent Paced: 98.61 %
Date Time Interrogation Session: 20250923000218
Implantable Lead Connection Status: 753985
Implantable Lead Connection Status: 753985
Implantable Lead Implant Date: 20220922
Implantable Lead Implant Date: 20220922
Implantable Lead Location: 753859
Implantable Lead Location: 753860
Implantable Lead Model: 3830
Implantable Lead Model: 5076
Implantable Pulse Generator Implant Date: 20220922
Lead Channel Impedance Value: 304 Ohm
Lead Channel Impedance Value: 361 Ohm
Lead Channel Impedance Value: 418 Ohm
Lead Channel Impedance Value: 456 Ohm
Lead Channel Pacing Threshold Amplitude: 0.625 V
Lead Channel Pacing Threshold Amplitude: 1.625 V
Lead Channel Pacing Threshold Pulse Width: 0.4 ms
Lead Channel Pacing Threshold Pulse Width: 0.4 ms
Lead Channel Sensing Intrinsic Amplitude: 17.5 mV
Lead Channel Sensing Intrinsic Amplitude: 17.5 mV
Lead Channel Sensing Intrinsic Amplitude: 3.375 mV
Lead Channel Sensing Intrinsic Amplitude: 3.375 mV
Lead Channel Setting Pacing Amplitude: 1.5 V
Lead Channel Setting Pacing Amplitude: 2.5 V
Lead Channel Setting Pacing Pulse Width: 0.8 ms
Lead Channel Setting Sensing Sensitivity: 0.9 mV
Zone Setting Status: 755011
Zone Setting Status: 755011

## 2024-07-19 NOTE — Progress Notes (Signed)
 Remote PPM Transmission

## 2024-07-20 DIAGNOSIS — H18413 Arcus senilis, bilateral: Secondary | ICD-10-CM | POA: Diagnosis not present

## 2024-07-20 DIAGNOSIS — H02831 Dermatochalasis of right upper eyelid: Secondary | ICD-10-CM | POA: Diagnosis not present

## 2024-07-20 DIAGNOSIS — H04123 Dry eye syndrome of bilateral lacrimal glands: Secondary | ICD-10-CM | POA: Diagnosis not present

## 2024-07-20 DIAGNOSIS — Z961 Presence of intraocular lens: Secondary | ICD-10-CM | POA: Diagnosis not present

## 2024-07-23 ENCOUNTER — Ambulatory Visit: Payer: Self-pay | Admitting: Internal Medicine

## 2024-08-03 DIAGNOSIS — H6122 Impacted cerumen, left ear: Secondary | ICD-10-CM | POA: Diagnosis not present

## 2024-08-09 DIAGNOSIS — E538 Deficiency of other specified B group vitamins: Secondary | ICD-10-CM | POA: Diagnosis not present

## 2024-08-22 ENCOUNTER — Other Ambulatory Visit: Payer: Self-pay

## 2024-08-22 DIAGNOSIS — I6523 Occlusion and stenosis of bilateral carotid arteries: Secondary | ICD-10-CM

## 2024-09-11 DIAGNOSIS — N400 Enlarged prostate without lower urinary tract symptoms: Secondary | ICD-10-CM | POA: Diagnosis not present

## 2024-09-11 DIAGNOSIS — R413 Other amnesia: Secondary | ICD-10-CM | POA: Diagnosis not present

## 2024-09-11 DIAGNOSIS — E1151 Type 2 diabetes mellitus with diabetic peripheral angiopathy without gangrene: Secondary | ICD-10-CM | POA: Diagnosis not present

## 2024-09-11 DIAGNOSIS — I739 Peripheral vascular disease, unspecified: Secondary | ICD-10-CM | POA: Diagnosis not present

## 2024-09-11 DIAGNOSIS — E538 Deficiency of other specified B group vitamins: Secondary | ICD-10-CM | POA: Diagnosis not present

## 2024-09-11 DIAGNOSIS — I10 Essential (primary) hypertension: Secondary | ICD-10-CM | POA: Diagnosis not present

## 2024-09-11 DIAGNOSIS — E7800 Pure hypercholesterolemia, unspecified: Secondary | ICD-10-CM | POA: Diagnosis not present

## 2024-09-11 DIAGNOSIS — Z0000 Encounter for general adult medical examination without abnormal findings: Secondary | ICD-10-CM | POA: Diagnosis not present

## 2024-09-11 DIAGNOSIS — D6869 Other thrombophilia: Secondary | ICD-10-CM | POA: Diagnosis not present

## 2024-09-11 DIAGNOSIS — Z950 Presence of cardiac pacemaker: Secondary | ICD-10-CM | POA: Diagnosis not present

## 2024-09-11 DIAGNOSIS — I2510 Atherosclerotic heart disease of native coronary artery without angina pectoris: Secondary | ICD-10-CM | POA: Diagnosis not present

## 2024-09-11 DIAGNOSIS — E039 Hypothyroidism, unspecified: Secondary | ICD-10-CM | POA: Diagnosis not present

## 2024-09-11 DIAGNOSIS — I4891 Unspecified atrial fibrillation: Secondary | ICD-10-CM | POA: Diagnosis not present

## 2024-09-11 DIAGNOSIS — Z1331 Encounter for screening for depression: Secondary | ICD-10-CM | POA: Diagnosis not present

## 2024-09-25 ENCOUNTER — Ambulatory Visit: Admitting: Physician Assistant

## 2024-09-25 ENCOUNTER — Ambulatory Visit (HOSPITAL_COMMUNITY)
Admission: RE | Admit: 2024-09-25 | Discharge: 2024-09-25 | Disposition: A | Source: Ambulatory Visit | Attending: Surgery | Admitting: Surgery

## 2024-09-25 VITALS — BP 130/66 | HR 59 | Temp 97.4°F | Ht 65.0 in | Wt 179.1 lb

## 2024-09-25 DIAGNOSIS — I6523 Occlusion and stenosis of bilateral carotid arteries: Secondary | ICD-10-CM | POA: Diagnosis not present

## 2024-09-25 NOTE — Progress Notes (Signed)
 Office Note     CC:  follow up Requesting Provider:  Rexanne Ingle, MD  HPI: [name] [name] is a 83 y.o. (1941-03-31) male who presents for surveillance follow up of carotid artery stenosis.  No prior history of TIA or stroke. No prior carotid intervention.   He is without visual changes, slurred speech, facial drooping, unilateral upper or lower extremity weakness or numbness. Patient is here with his wife who helps to provide history as he has been diagnosed with dementia.   Past Medical History:  Diagnosis Date   Benign prostatic hypertrophy with lower urinary tract symptoms (LUTS)    Bilateral edema of lower extremity    IMPROVED W/ TED HOSE   CAD (coronary artery disease)    cardiologist-  dr victory sharps--  s/p  cabg x6 in 1998   Carotid artery occlusion    Heart murmur    Hiatal hernia    History of kidney stones    Hydronephrosis, left    Hyperlipidemia    Hypertension    Hypothyroidism    Left ureteral stone    Memory difficulties    Nephrolithiasis    right side per ct  non-obstructive   PVD (peripheral vascular disease)    RBBB (right bundle branch block)    S/P CABG x 6    1998   Sigmoid diverticulosis    Sinus bradycardia    Type 2 diabetes mellitus (HCC)     Past Surgical History:  Procedure Laterality Date   CATARACT EXTRACTION W/ INTRAOCULAR LENS  IMPLANT, BILATERAL  2011   CORONARY ARTERY BYPASS GRAFT  1998   CABG X 6 --   LIMA  to the LAD and  other undefined SVG's   CYSTOSCOPY WITH BIOPSY N/A 11/29/2015   Procedure: CYSTOSCOPY WITH BIOPSY AND FULGERATION;  Surgeon: Ricardo Likens, MD;  Location: [facility];  Service: Urology;  Laterality: N/A;   CYSTOSCOPY/RETROGRADE/URETEROSCOPY/STONE EXTRACTION WITH BASKET Left 11/29/2015   Procedure: CYSTOSCOPY/RETROGRADE/DIAGNOSTIC URETEROSCOPY/STONE EXTRACTION;  Surgeon: Ricardo Likens, MD;  Location: [facility];  Service: Urology;  Laterality: Left;   EXTRACORPOREAL SHOCK WAVE  LITHOTRIPSY Left 08-12-2015   INGUINAL HERNIA REPAIR Right 02/03/2022   Procedure: OPEN RIGHT INGUINAL HERNIA REPAIR WITH MESH;  Surgeon: Signe Mitzie LABOR, MD;  Location: [facility];  Service: General;  Laterality: Right;   LEFT HEART CATHETERIZATION WITH CORONARY/GRAFT ANGIOGRAM  06/16/2013   Procedure: LEFT HEART CATHETERIZATION WITH EL BILE;  Surgeon: Victory LELON Sharps DOUGLAS, MD;  Location: [facility] CATH [facility];  Service: Cardiovascular; Native vessel occlusive disease w/ ostial total occlusion RCA, dLM, & heavy calicification LM LAD CX RCA/  SVG to diagonal patent, SVG to marginal 1 patent & 2 occluded, LIMA to dLAD patent/ dCFX collateral from obtuse marginal graft/  normal LVF   PACEMAKER IMPLANT N/A 07/17/2021   Procedure: PACEMAKER IMPLANT;  Surgeon: Waddell Danelle LELON, MD;  Location: [facility];  Service: Cardiovascular;  Laterality: N/A;   TRANSTHORACIC ECHOCARDIOGRAM  05-23-2015  dr victory sharps   mild LVH,  grade 1 diastolic dysfunction,  ef 55-060%,  mild AV calcifacation without stenosis,  mild AR and MR,  moderate LAE,  trivial PR and TR,  mild increase pulmonary pressure    Social History   Socioeconomic History   Marital status: Married    Spouse name: [name] [name]   Number of children: 2   Years of education: 12   Highest education level: Not on file  Occupational History    Comment: retired  Tobacco Use   Smoking status: Former    Current packs/day: 0.00    Average packs/day: 0.3 packs/day for 10.0 years (2.5 ttl pk-yrs)    Types: Cigarettes    Start date: 10/26/1956    Quit date: 10/26/1966    Years since quitting: 57.9    Passive exposure: Never   Smokeless tobacco: Never  Vaping Use   Vaping status: Never Used  Substance and Sexual Activity   Alcohol use: No    Alcohol/week: 0.0 standard drinks of alcohol   Drug use: No   Sexual activity: Not on file  Other Topics Concern   Not on file  Social History Narrative   06/09/21 lives with wife   Social Drivers of  Corporate Investment [employer]er Strain: Not on file  Food Insecurity: Not on file  Transportation Needs: Not on file  Physical Activity: Not on file  Stress: Not on file  Social Connections: Not on file  Intimate Partner Violence: Not on file    Family History  Problem Relation Age of Onset   Tuberculosis Mother    CAD Father    Heart disease Sister     Current Outpatient Medications  Medication Sig Dispense Refill   acetaminophen  (TYLENOL ) 325 MG tablet Take 1-2 tablets (325-650 mg total) by mouth every 4 (four) hours as needed for mild pain. 30 tablet 0   amLODipine  (NORVASC ) 5 MG tablet SMARTSIG:1 Tablet(s) By Mouth Every Evening     apixaban  (ELIQUIS ) 5 MG TABS tablet Start taking 07/22/21 in the morning 1 tablet (5 mg total) by mouth 2 (two) times daily. 60 tablet 0   atorvastatin  (LIPITOR) 40 MG tablet Take 40 mg by mouth daily.     Cyanocobalamin 1000 MCG/ML KIT Inject 1,000 mcg as directed every 30 (thirty) days.     finasteride  (PROSCAR ) 5 MG tablet Take 5 mg by mouth every morning.      Flaxseed, Linseed, (FLAX SEEDS PO) Take 1 capsule by mouth daily.     hydrochlorothiazide  (HYDRODIURIL ) 25 MG tablet Take 25 mg by mouth every morning.      isosorbide  mononitrate (IMDUR ) 30 MG 24 hr tablet Take 1 tablet by mouth once daily 90 tablet 2   levothyroxine  (SYNTHROID , LEVOTHROID) 100 MCG tablet Take 100 mcg by mouth daily before breakfast.     memantine  (NAMENDA ) 10 MG tablet Take 1 tablet by mouth twice daily 180 tablet 0   metFORMIN  (GLUCOPHAGE ) 500 MG tablet Take 500 mg by mouth daily with breakfast.     metoprolol  succinate (TOPROL -XL) 25 MG 24 hr tablet Take 1 tablet by mouth once daily 90 tablet 3   nitroGLYCERIN  (NITROSTAT ) 0.4 MG SL tablet DISSOLVE ONE TABLET UNDER THE TONGUE EVERY 5 MINUTES AS NEEDED FOR CHEST PAIN. 25 tablet 11   potassium chloride  (KLOR-CON  M) 10 MEQ tablet Take 10 mEq by mouth daily.     tamsulosin  (FLOMAX ) 0.4 MG CAPS capsule Take 0.4 mg by mouth daily.      No current facility-administered medications for this visit.    Allergies  Allergen Reactions   Spironolactone  Rash   Penicillins Rash    Has patient had a PCN reaction causing immediate rash, facial/tongue/throat swelling, SOB or lightheadedness with hypotension:no Has patient had a PCN reaction causing severe rash involving mucus membranes or skin necrosis: no Has patient had a PCN reaction that required hospitalization no Has patient had a PCN reaction occurring within the last 10 years: no If all of the above answers are  NO, then may proceed with Cephalosporin use.      REVIEW OF SYSTEMS:  Negative unless noted in HPI [X]  denotes positive finding, [ ]  denotes negative finding Cardiac  Comments:  Chest pain or chest pressure:    Shortness of breath upon exertion:    Short of breath when lying flat:    Irregular heart rhythm:        Vascular    Pain in calf, thigh, or hip brought on by ambulation:    Pain in feet at night that wakes you up from your sleep:     Blood clot in your veins:    Leg swelling:         Pulmonary    Oxygen at home:    Productive cough:     Wheezing:         Neurologic    Sudden weakness in arms or legs:     Sudden numbness in arms or legs:     Sudden onset of difficulty speaking or slurred speech:    Temporary loss of vision in one eye:     Problems with dizziness:         Gastrointestinal    Blood in stool:     Vomited blood:         Genitourinary    Burning when urinating:     Blood in urine:        Psychiatric    Major depression:         Hematologic    Bleeding problems:    Problems with blood clotting too easily:        Skin    Rashes or ulcers:        Constitutional    Fever or chills:      PHYSICAL EXAMINATION:  Vitals:   09/25/24 1505 09/25/24 1509  BP: 135/76 130/66  Pulse: (!) 59   Temp: (!) 97.4 F (36.3 C)   TempSrc: Temporal   SpO2: 94%   Weight: 179 lb 1.6 oz (81.2 kg)   Height: 5' 5 (1.651 m)      General:  WDWN in NAD; vital signs documented above Gait: Normal HENT: WNL, normocephalic Pulmonary: normal non-labored breathing Cardiac: regular HR; no carotid bruits Abdomen: soft Vascular Exam/Pulses: 2+ radial, 2+ DP pulse bilaterally Extremities: without ischemic changes, without Gangrene , without cellulitis; without open wounds; moving all extremities without deficits Musculoskeletal: no muscle wasting or atrophy  Neurologic: A&O X 3 Psychiatric:  The pt has Normal affect.   Non-Invasive Vascular Imaging:   VAS US  Carotid Duplex Bilateral: Summary:  Right Carotid: Velocities in the right ICA are consistent with a 1-39% stenosis.   Left Carotid: Velocities in the left ICA are consistent with a 1-39% stenosis.   Vertebrals: Bilateral vertebral arteries demonstrate antegrade flow.  Subclavians: Right subclavian artery was stenotic. Normal flow hemodynamics were seen in the left subclavian artery.  NOTE: Calcific plaque may obscure higher velocity.   ASSESSMENT/PLAN:: 83 y.o. male here for follow up for surveillance follow up of carotid artery stenosis. Patient has dementia so wife is present to provide history. He has not had any TIA or stroke like symptoms. - Duplex shows 1-39% ICA stenosis bilaterally. Essentially study is unchanged from prior - Continue Eliquis  and statin -He will follow up in 1 year with repeat carotid duplex   Teretha Damme, PA-C [facility] [phone]  Clinic MD:   [place]

## 2024-10-16 NOTE — Progress Notes (Unsigned)
 " Cardiology Office Note   Date:  10/17/2024  ID:  [name] [name], [name] [name] 1941/10/13, MRN [id number] PCP: [name] Ingle, MD  [facility] Providers Cardiologist:  [name] Parchment, MD Electrophysiologist:  [name] DELENA Primus, MD   History of Present Illness [name] [name] is a 83 y.o. male with a past medical history of CAD status post CABG in 1998, HTN, HLD, atrial fibrillation, CHB with PPM here for follow-up appointment.  He was last seen by [name] Arthur, PA-C 2/25.  He had seen Dr. Parchment 2 years ago with complaint of chest pain more so in the cold weather which was well-managed with as needed nitroglycerin .  No changes were made at that time.  At the last office visit in February he was doing well without any issues.  No changes were made.  No palpitations or shortness of breath.  No DOE, dizzy spells, syncope/near syncope.  No bleeding or signs of bleeding.  Device interrogation done without any issues.  Chads Vascor of 4 on Eliquis .  Appropriately dosed and last monitor showed 0% burden.  Today, he presents with a hx of CABG, hypertension, hyperlipidemia, atrial fibrillation, and complete heart block with pacemaker who presents for a follow-up appointment.  He is doing well overall without atrial fibrillation episodes, chest pain, or shortness of breath and feels able to perform desired activities despite a mostly sedentary lifestyle.  His carotid ultrasound on September 25, 2024, showed 39% bilateral stenosis, unchanged from 2020.  He has not had any pacemaker issues.  His blood pressure is well controlled on amlodipine  5 mg daily, HCTZ 25 mg, Imdur  30 mg, metoprolol  succinate 25 mg daily, Eliquis  5 mg twice daily, Lipitor 40 mg, levothyroxine  88 mg, potassium replacement, and metformin  500 mg daily. His recent hemoglobin A1c was 6.2.  He has chest pain only in extremely cold temperatures that is relieved by nitroglycerin  and has not changed since earlier this year. He has no chest pain  in warm weather.  Reports no shortness of breath nor dyspnea on exertion.  No edema, orthopnea, PND. Reports no palpitations.   Discussed the use of AI scribe software for clinical note transcription with the patient, who gave verbal consent to proceed.  ROS: pertinent ROS in HPI  Studies Reviewed EKG Interpretation Date/Time:  Tuesday October 17 2024 11:00:16 EST Ventricular Rate:  60 PR Interval:  178 QRS Duration:  182 QT Interval:  462 QTC Calculation: 462 R Axis:   91  Text Interpretation: AV dual-paced rhythm When compared with ECG of 19-Oct-2023 08:46, No significant change was found Confirmed by [name] [name] [phone]) on 10/17/2024 11:08:04 AM   Carotid ultrasound 09/25/2024 Summary:  Right Carotid: Velocities in the right ICA are consistent with a 1-39%  stenosis.   Left Carotid: Velocities in the left ICA are consistent with a 1-39%  stenosis.   Vertebrals: Bilateral vertebral arteries demonstrate antegrade flow.  Subclavians: Right subclavian artery was stenotic. Normal flow  hemodynamics were               seen in the left subclavian artery.                 NOTE: Calcific plaque may obscure higher velocity.   *See table(s) above for measurements and observations.  Risk Assessment/Calculations  CHA2DS2-VASc Score = 5  This indicates a 7.2% annual risk of stroke. The patient's score is based upon: CHF History: 0 HTN History: 1 Diabetes History: 1 Stroke History: 0 Vascular Disease History: 1 Age  Score: 2 Gender Score: 0      Physical Exam VS:  BP 116/68   Pulse 64   Ht 5' 6 (1.676 m)   Wt 177 lb (80.3 kg)   SpO2 97%   BMI 28.57 kg/m        Wt Readings from Last 3 Encounters:  10/17/24 177 lb (80.3 kg)  09/25/24 179 lb 1.6 oz (81.2 kg)  11/30/23 173 lb (78.5 kg)    GEN: Well nourished, well developed in no acute distress NECK: No JVD; No carotid bruits CARDIAC: RRR, no murmurs, rubs, gallops RESPIRATORY:  Clear to auscultation without rales,  wheezing or rhonchi  ABDOMEN: Soft, non-tender, non-distended EXTREMITIES:  No edema; No deformity   ASSESSMENT AND PLAN  Bilateral extracranial carotid artery stenosis Bilateral carotid artery stenosis with 13.9% stenosis on both sides, unchanged over the last six years. -no symptoms or bruit on exam  Paroxysmal atrial fibrillation No episodes reported. Well-managed on current medication regimen. - Continue current medication regimen including Eliquis  and metoprolol .  Cardiac pacemaker in situ for complete heart block Pacemaker functioning well with no reported issues.  Coronary artery disease with stable angina Intermittent chest pain in extreme cold, managed with nitroglycerin . No chest pain in warm weather. - Continue nitroglycerin  as needed for chest pain. -continue Imdur  30mg  daily  Essential hypertension Blood pressure well controlled on current medication regimen. - Continue current antihypertensive medications including amlodipine  and HCTZ.  Type 2 diabetes mellitus Recent hemoglobin A1c of 6.2, indicating good glycemic control. - Continue metformin  500 mg daily.  Hyperlipidemia Managed with Lipitor 40 mg daily.      Dispo: He can follow-up with EP in 6 months and Dr. Jeffrie in 1 year  Signed, [name] LOISE Fabry, PA-C   "

## 2024-10-17 ENCOUNTER — Ambulatory Visit: Payer: PPO

## 2024-10-17 ENCOUNTER — Ambulatory Visit: Attending: Physician Assistant | Admitting: Physician Assistant

## 2024-10-17 VITALS — BP 116/68 | HR 64 | Ht 66.0 in | Wt 177.0 lb

## 2024-10-17 DIAGNOSIS — I6523 Occlusion and stenosis of bilateral carotid arteries: Secondary | ICD-10-CM

## 2024-10-17 DIAGNOSIS — I2510 Atherosclerotic heart disease of native coronary artery without angina pectoris: Secondary | ICD-10-CM | POA: Diagnosis not present

## 2024-10-17 DIAGNOSIS — I10 Essential (primary) hypertension: Secondary | ICD-10-CM

## 2024-10-17 DIAGNOSIS — Z950 Presence of cardiac pacemaker: Secondary | ICD-10-CM | POA: Diagnosis not present

## 2024-10-17 DIAGNOSIS — I25118 Atherosclerotic heart disease of native coronary artery with other forms of angina pectoris: Secondary | ICD-10-CM

## 2024-10-17 DIAGNOSIS — I480 Paroxysmal atrial fibrillation: Secondary | ICD-10-CM

## 2024-10-17 DIAGNOSIS — I4510 Unspecified right bundle-branch block: Secondary | ICD-10-CM

## 2024-10-17 DIAGNOSIS — Z8679 Personal history of other diseases of the circulatory system: Secondary | ICD-10-CM | POA: Diagnosis not present

## 2024-10-17 MED ORDER — ATORVASTATIN CALCIUM 40 MG PO TABS: 40.0000 mg | ORAL_TABLET | Freq: Every day | ORAL | 3 refills | Status: AC

## 2024-10-17 MED ORDER — ISOSORBIDE MONONITRATE ER 30 MG PO TB24: 30.0000 mg | ORAL_TABLET | Freq: Every day | ORAL | 2 refills | Status: AC

## 2024-10-17 MED ORDER — METOPROLOL SUCCINATE ER 25 MG PO TB24: 25.0000 mg | ORAL_TABLET | Freq: Every day | ORAL | 3 refills | Status: AC

## 2024-10-17 MED ORDER — AMLODIPINE BESYLATE 5 MG PO TABS: 5.0000 mg | ORAL_TABLET | Freq: Every day | ORAL | 3 refills | Status: AC

## 2024-10-17 MED ORDER — APIXABAN 5 MG PO TABS: 5.0000 mg | ORAL_TABLET | Freq: Two times a day (BID) | ORAL | 11 refills | Status: AC

## 2024-10-17 MED ORDER — HYDROCHLOROTHIAZIDE 25 MG PO TABS: 25.0000 mg | ORAL_TABLET | Freq: Every morning | ORAL | 3 refills | Status: AC

## 2024-10-17 MED ORDER — NITROGLYCERIN 0.4 MG SL SUBL: SUBLINGUAL_TABLET | SUBLINGUAL | 11 refills | Status: AC

## 2024-10-17 MED ORDER — POTASSIUM CHLORIDE CRYS ER 10 MEQ PO TBCR: 10.0000 meq | EXTENDED_RELEASE_TABLET | Freq: Every day | ORAL | 3 refills | Status: AC

## 2024-10-17 NOTE — Patient Instructions (Signed)
 Medication Instructions:  Your physician recommends that you continue on your current medications as directed. Please refer to the Current Medication list given to you today.  *If you need a refill on your cardiac medications before your next appointment, please call your pharmacy*  Lab Work: None ordered If you have labs (blood work) drawn today and your tests are completely normal, you will receive your results only by: MyChart Message (if you have MyChart) OR A paper copy in the mail If you have any lab test that is abnormal or we need to change your treatment, we will call you to review the results.  Testing/Procedures: None ordered  Follow-Up: At [facility], you and your health needs are our priority.  As part of our continuing mission to provide you with exceptional heart care, our providers are all part of one team.  This team includes your primary Cardiologist (physician) and Advanced Practice Providers or APPs (Physician Assistants and Nurse Practitioners) who all work together to provide you with the care you need, when you need it.  Your next appointment:   6 month(s)  Provider:   Donnice Primus, MD   1 Year with Oneil Parchment, MD   We recommend signing up for the patient portal called MyChart.  Sign up information is provided on this After Visit Summary.  MyChart is used to connect with patients for Virtual Visits (Telemedicine).  Patients are able to view lab/test results, encounter notes, upcoming appointments, etc.  Non-urgent messages can be sent to your provider as well.   To learn more about what you can do with MyChart, go to [email or web address].

## 2024-10-18 LAB — CUP PACEART REMOTE DEVICE CHECK
Battery Remaining Longevity: 73 mo
Battery Voltage: 2.98 V
Brady Statistic AP VP Percent: 90.36 %
Brady Statistic AP VS Percent: 0.01 %
Brady Statistic AS VP Percent: 8.06 %
Brady Statistic AS VS Percent: 1.57 %
Brady Statistic RA Percent Paced: 91.05 %
Brady Statistic RV Percent Paced: 98.42 %
Date Time Interrogation Session: 20251223044012
Implantable Lead Connection Status: 753985
Implantable Lead Connection Status: 753985
Implantable Lead Implant Date: 20220922
Implantable Lead Implant Date: 20220922
Implantable Lead Location: 753859
Implantable Lead Location: 753860
Implantable Lead Model: 3830
Implantable Lead Model: 5076
Implantable Pulse Generator Implant Date: 20220922
Lead Channel Impedance Value: 304 Ohm
Lead Channel Impedance Value: 323 Ohm
Lead Channel Impedance Value: 399 Ohm
Lead Channel Impedance Value: 418 Ohm
Lead Channel Pacing Threshold Amplitude: 0.625 V
Lead Channel Pacing Threshold Amplitude: 1.75 V
Lead Channel Pacing Threshold Pulse Width: 0.4 ms
Lead Channel Pacing Threshold Pulse Width: 0.4 ms
Lead Channel Sensing Intrinsic Amplitude: 3 mV
Lead Channel Sensing Intrinsic Amplitude: 3 mV
Lead Channel Sensing Intrinsic Amplitude: 8.625 mV
Lead Channel Sensing Intrinsic Amplitude: 8.625 mV
Lead Channel Setting Pacing Amplitude: 1.5 V
Lead Channel Setting Pacing Amplitude: 2.5 V
Lead Channel Setting Pacing Pulse Width: 0.8 ms
Lead Channel Setting Sensing Sensitivity: 0.9 mV
Zone Setting Status: 755011
Zone Setting Status: 755011

## 2024-10-18 NOTE — Progress Notes (Signed)
 Remote PPM Transmission

## 2024-10-20 ENCOUNTER — Ambulatory Visit: Payer: Self-pay | Admitting: Cardiology

## 2024-10-20 ENCOUNTER — Ambulatory Visit: Admitting: Physician Assistant

## 2024-11-29 ENCOUNTER — Ambulatory Visit: Admitting: Physician Assistant

## 2025-01-01 ENCOUNTER — Ambulatory Visit: Admitting: Cardiology
